# Patient Record
Sex: Female | Born: 1975
Health system: Southern US, Community
[De-identification: ages and names within clinical notes are randomized; demographics above are authoritative.]

## PROBLEM LIST (undated history)

## (undated) DIAGNOSIS — I120 Hypertensive chronic kidney disease with stage 5 chronic kidney disease or end stage renal disease: Secondary | ICD-10-CM

## (undated) DIAGNOSIS — I10 Essential (primary) hypertension: Secondary | ICD-10-CM

## (undated) DIAGNOSIS — N184 Chronic kidney disease, stage 4 (severe): Secondary | ICD-10-CM

## (undated) DIAGNOSIS — N186 End stage renal disease: Secondary | ICD-10-CM

## (undated) DIAGNOSIS — I2510 Atherosclerotic heart disease of native coronary artery without angina pectoris: Secondary | ICD-10-CM

## (undated) DIAGNOSIS — B009 Herpesviral infection, unspecified: Secondary | ICD-10-CM

## (undated) HISTORY — PX: HYSTEROPLASTY: SHX988

## (undated) HISTORY — PX: APPENDECTOMY: SHX54

## (undated) HISTORY — DX: Dependence on renal dialysis: N18.6

## (undated) HISTORY — PX: THYROID SURGERY: SHX805

## (undated) HISTORY — DX: Herpesviral infection, unspecified: B00.9

---

## 2018-05-30 DIAGNOSIS — B009 Herpesviral infection, unspecified: Secondary | ICD-10-CM | POA: Insufficient documentation

## 2019-04-21 DIAGNOSIS — I219 Acute myocardial infarction, unspecified: Secondary | ICD-10-CM

## 2019-04-21 HISTORY — DX: Acute myocardial infarction, unspecified: I21.9

## 2019-06-01 DIAGNOSIS — N179 Acute kidney failure, unspecified: Secondary | ICD-10-CM

## 2019-06-01 DIAGNOSIS — N189 Chronic kidney disease, unspecified: Secondary | ICD-10-CM | POA: Insufficient documentation

## 2019-06-01 HISTORY — DX: Chronic kidney disease, unspecified: N18.9

## 2019-06-01 HISTORY — DX: Chronic kidney disease, unspecified: N17.9

## 2019-07-12 DIAGNOSIS — N189 Chronic kidney disease, unspecified: Secondary | ICD-10-CM | POA: Insufficient documentation

## 2019-07-12 DIAGNOSIS — Z87448 Personal history of other diseases of urinary system: Secondary | ICD-10-CM | POA: Insufficient documentation

## 2019-07-12 HISTORY — DX: Chronic kidney disease, unspecified: N18.9

## 2020-01-24 DIAGNOSIS — R319 Hematuria, unspecified: Secondary | ICD-10-CM

## 2020-01-24 DIAGNOSIS — I151 Hypertension secondary to other renal disorders: Secondary | ICD-10-CM | POA: Insufficient documentation

## 2020-01-24 DIAGNOSIS — N83209 Unspecified ovarian cyst, unspecified side: Secondary | ICD-10-CM | POA: Insufficient documentation

## 2020-01-24 DIAGNOSIS — M722 Plantar fascial fibromatosis: Secondary | ICD-10-CM | POA: Insufficient documentation

## 2020-01-24 DIAGNOSIS — Z01818 Encounter for other preprocedural examination: Secondary | ICD-10-CM | POA: Insufficient documentation

## 2020-01-24 HISTORY — DX: Unspecified ovarian cyst, unspecified side: N83.209

## 2020-01-24 HISTORY — DX: Hematuria, unspecified: R31.9

## 2020-01-24 HISTORY — DX: Hypertension secondary to other renal disorders: I15.1

## 2020-03-27 ENCOUNTER — Emergency Department (HOSPITAL_COMMUNITY): Payer: POS

## 2020-03-27 ENCOUNTER — Other Ambulatory Visit: Payer: Self-pay

## 2020-03-27 ENCOUNTER — Inpatient Hospital Stay (HOSPITAL_COMMUNITY)
Admission: EM | Admit: 2020-03-27 | Discharge: 2020-04-03 | DRG: 246 | Disposition: A | Discharge status: Home / Self Care | Payer: POS | Attending: Internal Medicine | Admitting: Internal Medicine

## 2020-03-27 DIAGNOSIS — R778 Other specified abnormalities of plasma proteins: Secondary | ICD-10-CM

## 2020-03-27 DIAGNOSIS — I5041 Acute combined systolic (congestive) and diastolic (congestive) heart failure: Secondary | ICD-10-CM | POA: Diagnosis present

## 2020-03-27 DIAGNOSIS — I309 Acute pericarditis, unspecified: Secondary | ICD-10-CM

## 2020-03-27 DIAGNOSIS — D631 Anemia in chronic kidney disease: Secondary | ICD-10-CM | POA: Diagnosis present

## 2020-03-27 DIAGNOSIS — N644 Mastodynia: Secondary | ICD-10-CM | POA: Diagnosis present

## 2020-03-27 DIAGNOSIS — N2581 Secondary hyperparathyroidism of renal origin: Secondary | ICD-10-CM | POA: Diagnosis present

## 2020-03-27 DIAGNOSIS — R630 Anorexia: Secondary | ICD-10-CM | POA: Diagnosis present

## 2020-03-27 DIAGNOSIS — Z87891 Personal history of nicotine dependence: Secondary | ICD-10-CM

## 2020-03-27 DIAGNOSIS — D72829 Elevated white blood cell count, unspecified: Secondary | ICD-10-CM | POA: Diagnosis present

## 2020-03-27 DIAGNOSIS — E872 Acidosis: Secondary | ICD-10-CM | POA: Diagnosis present

## 2020-03-27 DIAGNOSIS — N186 End stage renal disease: Secondary | ICD-10-CM | POA: Diagnosis not present

## 2020-03-27 DIAGNOSIS — N250 Renal osteodystrophy: Secondary | ICD-10-CM | POA: Diagnosis present

## 2020-03-27 DIAGNOSIS — Z8249 Family history of ischemic heart disease and other diseases of the circulatory system: Secondary | ICD-10-CM

## 2020-03-27 DIAGNOSIS — Z79899 Other long term (current) drug therapy: Secondary | ICD-10-CM

## 2020-03-27 DIAGNOSIS — I319 Disease of pericardium, unspecified: Secondary | ICD-10-CM

## 2020-03-27 DIAGNOSIS — I301 Infective pericarditis: Secondary | ICD-10-CM | POA: Diagnosis present

## 2020-03-27 DIAGNOSIS — I214 Non-ST elevation (NSTEMI) myocardial infarction: Principal | ICD-10-CM | POA: Diagnosis present

## 2020-03-27 DIAGNOSIS — Z6825 Body mass index (BMI) 25.0-25.9, adult: Secondary | ICD-10-CM

## 2020-03-27 DIAGNOSIS — E785 Hyperlipidemia, unspecified: Secondary | ICD-10-CM | POA: Diagnosis present

## 2020-03-27 DIAGNOSIS — Z23 Encounter for immunization: Secondary | ICD-10-CM

## 2020-03-27 DIAGNOSIS — Z992 Dependence on renal dialysis: Secondary | ICD-10-CM

## 2020-03-27 DIAGNOSIS — R079 Chest pain, unspecified: Secondary | ICD-10-CM

## 2020-03-27 DIAGNOSIS — M898X9 Other specified disorders of bone, unspecified site: Secondary | ICD-10-CM | POA: Diagnosis present

## 2020-03-27 DIAGNOSIS — Z833 Family history of diabetes mellitus: Secondary | ICD-10-CM

## 2020-03-27 DIAGNOSIS — N269 Renal sclerosis, unspecified: Secondary | ICD-10-CM | POA: Diagnosis present

## 2020-03-27 DIAGNOSIS — Z20822 Contact with and (suspected) exposure to covid-19: Secondary | ICD-10-CM | POA: Diagnosis present

## 2020-03-27 DIAGNOSIS — I2510 Atherosclerotic heart disease of native coronary artery without angina pectoris: Secondary | ICD-10-CM | POA: Diagnosis present

## 2020-03-27 DIAGNOSIS — I132 Hypertensive heart and chronic kidney disease with heart failure and with stage 5 chronic kidney disease, or end stage renal disease: Secondary | ICD-10-CM | POA: Diagnosis present

## 2020-03-27 DIAGNOSIS — Z95820 Peripheral vascular angioplasty status with implants and grafts: Secondary | ICD-10-CM

## 2020-03-27 HISTORY — DX: Chronic kidney disease, stage 4 (severe): N18.4

## 2020-03-27 HISTORY — DX: Essential (primary) hypertension: I10

## 2020-03-27 LAB — I-STAT BETA HCG BLOOD, ED (MC, WL, AP ONLY): I-stat hCG, quantitative: <5 m[IU]/mL (ref <5)

## 2020-03-27 LAB — CBC
HCT: 35.1 % — ABNORMAL LOW (ref 36.0–46.0)
Hemoglobin: 11.2 g/dL — ABNORMAL LOW (ref 12.0–15.0)
MCH: 26.9 pg (ref 26.0–34.0)
MCHC: 31.9 g/dL (ref 30.0–36.0)
MCV: 84.4 fL (ref 80.0–100.0)
Platelets: 403 10*3/uL — ABNORMAL HIGH (ref 150–400)
RBC: 4.16 MIL/uL (ref 3.87–5.11)
RDW: 15.5 % (ref 11.5–15.5)
WBC: 14.3 10*3/uL — ABNORMAL HIGH (ref 4.0–10.5)
nRBC: 0 % (ref 0.0–0.2)

## 2020-03-27 LAB — BASIC METABOLIC PANEL
Anion gap: 14 (ref 5–15)
BUN: 44 mg/dL — ABNORMAL HIGH (ref 6–20)
CO2: 17 mmol/L — ABNORMAL LOW (ref 22–32)
Calcium: 9.4 mg/dL (ref 8.9–10.3)
Chloride: 108 mmol/L (ref 98–111)
Creatinine, Ser: 11.16 mg/dL — ABNORMAL HIGH (ref 0.44–1.00)
GFR, Estimated: 4 mL/min — ABNORMAL LOW (ref >60)
Glucose, Bld: 106 mg/dL — ABNORMAL HIGH (ref 70–99)
Potassium: 4.8 mmol/L (ref 3.5–5.1)
Sodium: 139 mmol/L (ref 135–145)

## 2020-03-27 LAB — HIV ANTIBODY (ROUTINE TESTING W REFLEX): HIV Screen 4th Generation wRfx: NONREACTIVE

## 2020-03-27 LAB — TROPONIN I (HIGH SENSITIVITY)
Troponin I (High Sensitivity): 1315 ng/L (ref <18)
Troponin I (High Sensitivity): 1636 ng/L (ref <18)
Troponin I (High Sensitivity): 2903 ng/L (ref <18)

## 2020-03-27 LAB — BRAIN NATRIURETIC PEPTIDE: B Natriuretic Peptide: 160.7 pg/mL — ABNORMAL HIGH (ref 0.0–100.0)

## 2020-03-27 LAB — RESP PANEL BY RT-PCR (FLU A&B, COVID) ARPGX2
Influenza A by PCR: NEGATIVE
Influenza B by PCR: NEGATIVE
SARS Coronavirus 2 by RT PCR: NEGATIVE

## 2020-03-27 MED ORDER — CHOLECALCIFEROL 10 MCG (400 UNIT) PO TABS
400.0000 [IU] | ORAL_TABLET | Freq: Every day | ORAL | Status: DC
  Administered 2020-03-27 – 2020-04-03 (×8): 400 [IU] via ORAL
  Filled 2020-03-27 (×9): qty 1

## 2020-03-27 MED ORDER — ONDANSETRON HCL 4 MG/2ML IJ SOLN
4.0000 mg | Freq: Once | INTRAMUSCULAR | Status: AC
  Administered 2020-03-27: 4 mg via INTRAVENOUS
  Filled 2020-03-27: qty 2

## 2020-03-27 MED ORDER — ACETAMINOPHEN 325 MG PO TABS
650.0000 mg | ORAL_TABLET | Freq: Four times a day (QID) | ORAL | Status: DC | PRN
  Administered 2020-04-02 – 2020-04-03 (×2): 650 mg via ORAL
  Filled 2020-03-27 (×2): qty 2

## 2020-03-27 MED ORDER — ONDANSETRON HCL 4 MG PO TABS: 4.0000 mg | ORAL_TABLET | Freq: Four times a day (QID) | ORAL | Status: DC | PRN

## 2020-03-27 MED ORDER — CALCITRIOL 0.25 MCG PO CAPS
0.2500 ug | ORAL_CAPSULE | ORAL | Status: DC
  Administered 2020-03-27 – 2020-04-03 (×4): 0.25 ug via ORAL
  Filled 2020-03-27 (×6): qty 1

## 2020-03-27 MED ORDER — LABETALOL HCL 200 MG PO TABS
200.0000 mg | ORAL_TABLET | Freq: Two times a day (BID) | ORAL | Status: DC
  Administered 2020-03-27 – 2020-03-28 (×2): 200 mg via ORAL
  Filled 2020-03-27 (×3): qty 1

## 2020-03-27 MED ORDER — SODIUM BICARBONATE 650 MG PO TABS
650.0000 mg | ORAL_TABLET | Freq: Two times a day (BID) | ORAL | Status: DC
  Administered 2020-03-27 – 2020-04-03 (×14): 650 mg via ORAL
  Filled 2020-03-27 (×14): qty 1

## 2020-03-27 MED ORDER — ACETAMINOPHEN 650 MG RE SUPP: 650.0000 mg | Freq: Four times a day (QID) | RECTAL | Status: DC | PRN

## 2020-03-27 MED ORDER — ONDANSETRON HCL 4 MG/2ML IJ SOLN
4.0000 mg | Freq: Four times a day (QID) | INTRAMUSCULAR | Status: DC | PRN
  Administered 2020-03-29: 4 mg via INTRAVENOUS
  Filled 2020-03-27 (×2): qty 2

## 2020-03-27 MED ORDER — MORPHINE SULFATE (PF) 2 MG/ML IV SOLN
2.0000 mg | Freq: Once | INTRAVENOUS | Status: AC
  Administered 2020-03-27: 2 mg via INTRAVENOUS
  Filled 2020-03-27: qty 1

## 2020-03-27 MED ORDER — ASPIRIN 325 MG PO TABS
325.0000 mg | ORAL_TABLET | Freq: Every day | ORAL | Status: DC
  Administered 2020-03-27: 325 mg via ORAL
  Filled 2020-03-27: qty 1

## 2020-03-27 MED ORDER — CHLORHEXIDINE GLUCONATE CLOTH 2 % EX PADS
6.0000 | MEDICATED_PAD | Freq: Every day | CUTANEOUS | Status: DC
  Administered 2020-03-29 – 2020-04-02 (×2): 6 via TOPICAL

## 2020-03-27 NOTE — H&P (Signed)
History and PhysicalZaylah Greene   VEH:[id number] DOB: [birth year] DOA: 03/27/2020  Referring MD/provider: Sharyn Lull PA PCP: Patient, No Pcp Per   Patient coming from: Home  Chief Complaint: Chest pain  History of Present Illness:   Stacie Greene is an 44 y.o. female with HTN and ESRD undergoing pretransplant work-up at [facility] about to start peritoneal dialysis was in her usual state of health until yesterday when she developed sharp chest pain with no provocation.  Notes the chest pain is worsened with lying down and improved with sitting up.  She notes it is worse with taking deep breaths and with coughing.  Not necessarily worsened with exertion.  No associated shortness of breath or DOE.  Patient denies fevers or chills.  No orthopnea or PND.  She does have nausea and vomited x2 today.  She has anorexia only since yesterday.  No blood in the vomitus.  No abdominal pain.  No diarrhea.  No palpitations.  ED Course:  The patient was noted to have normal vital signs.  Laboratory data were notable for creatinine of 11, WBC 14 and troponin of 1600.  Patient was discussed with cardiology and nephrology both of whom requested admission by hospitalist for further work-up.  ROS:   ROS   Review of Systems: Per HPI  Past Medical History:   No past medical history on file.  Past Surgical History:     Social History:   Social History   Socioeconomic History  . Marital status: Single    Spouse name: Not on file  . Number of children: Not on file  . Years of education: Not on file  . Highest education level: Not on file  Occupational History  . Not on file  Tobacco Use  . Smoking status: Not on file  Substance and Sexual Activity  . Alcohol use: Not on file  . Drug use: Not on file  . Sexual activity: Not on file  Other Topics Concern  . Not on file  Social History Narrative  . Not on file   Social Determinants of Health   Financial  Resource Strain:   . Difficulty of Paying Living Expenses: Not on file  Food Insecurity:   . Worried About Charity fundraiser in the Last Year: Not on file  . Ran Out of Food in the Last Year: Not on file  Transportation Needs:   . Lack of Transportation (Medical): Not on file  . Lack of Transportation (Non-Medical): Not on file  Physical Activity:   . Days of Exercise per Week: Not on file  . Minutes of Exercise per Session: Not on file  Stress:   . Feeling of Stress : Not on file  Social Connections:   . Frequency of Communication with Friends and Family: Not on file  . Frequency of Social Gatherings with Friends and Family: Not on file  . Attends Religious Services: Not on file  . Active Member of Clubs or Organizations: Not on file  . Attends Archivist Meetings: Not on file  . Marital Status: Not on file  Intimate Partner Violence:   . Fear of Current or Ex-Partner: Not on file  . Emotionally Abused: Not on file  . Physically Abused: Not on file  . Sexually Abused: Not on file    Allergies   Patient has no known allergies.  Family history:   No family history on file.  Current Medications:  Prior to Admission medications   Medication Sig Start Date End Date Taking? Authorizing Provider  calcitRIOL (ROCALTROL) 0.25 MCG capsule Take 0.25 mcg by mouth every Monday, Wednesday, and Friday. 02/26/20  Yes [provider]  FEROSUL 325 (65 Fe) MG tablet Take 325 mg by mouth daily. 01/15/20  Yes [provider]  labetalol (NORMODYNE) 200 MG tablet Take 200 mg by mouth 2 (two) times daily. 03/15/20  Yes [provider]  sodium bicarbonate 650 MG tablet Take 650 mg by mouth 2 (two) times daily. 11/24/19  Yes [provider]  VITAMIN D PO Take 1 tablet by mouth daily.   Yes [provider]    Physical Exam:   Vitals:   03/27/20 1420 03/27/20 1700  BP: 139/88 (!) 146/90  Pulse: 87 78  Resp: 18 16  Temp: 98.2 F (36.8 C)    TempSrc: Oral   SpO2: 100% 100%     Physical Exam: Blood pressure (!) 146/90, pulse 78, temperature 98.2 F (36.8 C), temperature source Oral, resp. rate 16, SpO2 100 %. Gen: Somewhat anxious appearing female sitting up in stretcher in no acute distress. Eyes: sclera anicteric, conjuctiva mildly injected bilaterally she has a disconjugate gaze with mild proptosis of left eye. CVS: S1-S2, regular, no gallops she may have a rub heard at the left sternal border. Respiratory: Clear to auscultation and percussion.   GI: NABS, soft, NT, no rebound no guarding LE: No edema. No cyanosis Neuro: A/O x 3, Moving all extremities equally with normal strength, CN 3-12 intact, grossly nonfocal.  Psych: patient is logical and coherent, judgement and insight appear normal, mood and affect appropriate to situation. Skin: no rashes or lesions or ulcers,    Data Review:    Labs: Basic Metabolic Panel: Recent Labs  Lab 03/27/20 1419  NA 139  K 4.8  CL 108  CO2 17*  GLUCOSE 106*  BUN 44*  CREATININE 11.16*  CALCIUM 9.4   Liver Function Tests: No results for input(s): AST, ALT, ALKPHOS, BILITOT, PROT, ALBUMIN in the last 168 hours. No results for input(s): LIPASE, AMYLASE in the last 168 hours. No results for input(s): AMMONIA in the last 168 hours. CBC: Recent Labs  Lab 03/27/20 1419  WBC 14.3*  HGB 11.2*  HCT 35.1*  MCV 84.4  PLT 403*   Cardiac Enzymes: No results for input(s): CKTOTAL, CKMB, CKMBINDEX, TROPONINI in the last 168 hours.  BNP (last 3 results) No results for input(s): PROBNP in the last 8760 hours. CBG: No results for input(s): GLUCAP in the last 168 hours.  Urinalysis No results found for: COLORURINE, APPEARANCEUR, LABSPEC, PHURINE, GLUCOSEU, HGBUR, BILIRUBINUR, KETONESUR, PROTEINUR, UROBILINOGEN, NITRITE, LEUKOCYTESUR    Radiographic Studies: DG Chest 2 View  Result Date: 03/27/2020 CLINICAL DATA:  Intermittent chest pain.  History of hypertension.  EXAM: CHEST - 2 VIEW COMPARISON:  None. FINDINGS: Heart and mediastinal shadows are normal. The lungs are clear. The vascularity is normal. No effusions. Surgical clips in the low right neck probably related to thyroid surgery. Mild thoracolumbar scoliotic curvature. IMPRESSION: No active cardiopulmonary disease. Mild thoracolumbar scoliotic curvature. Electronically Signed   By: Nelson Chimes M.D.   On: 03/27/2020 15:26    EKG: Independently reviewed.  Sinus rhythm at 80.  Normal intervals.  Left axis at -23.  Patient does have PR depression in 1 and 2.  She has T wave inversions in L, V1- V3.   Assessment/Plan:   Principal Problem:   ESRD (end stage renal disease) ([place])  Active Problems:   Pericarditis   Elevated troponin  44 year old female with ESRD presents with pericarditis.  Pericarditis Chest pain classic with worsening upon recumbency, improved with sitting up with some PR depressions on EKG. Likely uremic although patient does have leukocytosis as well Will avoid both colchicine and NSAIDs given end-stage renal disease Could consider initiation of steroids if warranted Await cardiology/renal recommendations of uremic pericarditis Echocardiogram ordered Repeat EKG in a.m.  Elevated troponin Troponin is more elevated than I would have expected for pericarditis Troponin continues to trend up, will follow every 6 hours Patient does not have any exertional chest pain suggestive of ischemia Await cardiology input  ESRD Patient is just developed nausea, vomiting, pericarditis and anorexia Sounds like she is developing some uremia Electrolytes are okay Patient will likely need to be started on peritoneal dialysis per nephrology here or her outpatient nephrologist.  HTN Continue labetalol 200 twice daily   Other information:   DVT prophylaxis: SCD ordered. Code Status: Full Family Communication: None, patient is in communication with her family Disposition Plan:  Home Consults called: Nephrology, cardiology by ED Admission status: Observation  Micajah Dennin Tublu Ilia Engelbert [facility]  If 7PM-7AM, please contact night-coverage [email or web address] Password [facility] 03/27/2020, 6:35 PM

## 2020-03-27 NOTE — ED Notes (Signed)
Pt has complaints of nausea, PA made aware

## 2020-03-27 NOTE — ED Notes (Signed)
Attempted to call report, no answer

## 2020-03-27 NOTE — ED Provider Notes (Signed)
[facility] EMERGENCY DEPARTMENT Provider Note   CSN: [id number] Arrival date & time: 03/27/20  1345     History No chief complaint on file.   Stacie Greene is a 44 y.o. female.  HPI 44 year old female with no medical history or to review in our system, however she reports end-stage renal disease pending initiation of dialysis, presents to the ER with complaints of 3 days of central chest pain and "squeezing sensation" of her heart.  Pain is worse with lying down, relieved with sitting up.  Feels as though someone is squeezing her heart.  States that she has never had anxiety but "this may be how anxiety feels".  No prior cardiac history.  States she has been pending getting a possible dialysis catheter, per chart review, patient is also being evaluated for kidney transplant.  He endorses 3 nonbloody nonbilious episodes of emesis in the last 24 hours.  She states that she normally has some nausea but does not actually have any emesis.  Denies any cough, fevers, chills.  Not on any OCPs, no recent travel.  Denies any pain on inspiration, though she does have some associated shortness of breath with the onset of chest pain.  She has not taken anything for her symptoms.  She has not noticed any lower extremity swelling.Not on any blood thinners     No past medical history on file.  Patient Active Problem List   Diagnosis Date Noted  . ESRD (end stage renal disease) ([place]) 03/27/2020  . Pericarditis 03/27/2020  . Elevated troponin 03/27/2020    The histories are not reviewed yet. Please review them in the "History" navigator section and refresh this [place].   OB History   No obstetric history on file.     No family history on file.  Social History   Tobacco Use  . Smoking status: Not on file  Substance Use Topics  . Alcohol use: Not on file  . Drug use: Not on file    Home Medications Prior to Admission medications   Medication Sig Start Date End Date  Taking? Authorizing Provider  calcitRIOL (ROCALTROL) 0.25 MCG capsule Take 0.25 mcg by mouth every Monday, Wednesday, and Friday. 02/26/20  Yes [provider]  FEROSUL 325 (65 Fe) MG tablet Take 325 mg by mouth daily. 01/15/20  Yes [provider]  labetalol (NORMODYNE) 200 MG tablet Take 200 mg by mouth 2 (two) times daily. 03/15/20  Yes [provider]  sodium bicarbonate 650 MG tablet Take 650 mg by mouth 2 (two) times daily. 11/24/19  Yes [provider]  VITAMIN D PO Take 1 tablet by mouth daily.   Yes [provider]    Allergies    Patient has no known allergies.  Review of Systems   Review of Systems  Constitutional: Negative for chills and fever.  HENT: Negative for ear pain and sore throat.   Eyes: Negative for pain and visual disturbance.  Respiratory: Positive for shortness of breath. Negative for cough.   Cardiovascular: Positive for chest pain. Negative for palpitations.  Gastrointestinal: Negative for abdominal pain and vomiting.  Genitourinary: Negative for dysuria and hematuria.  Musculoskeletal: Negative for arthralgias and back pain.  Skin: Negative for color change and rash.  Neurological: Negative for seizures and syncope.  All other systems reviewed and are negative.   Physical Exam Updated Vital Signs BP 131/85 (BP Location: Left Arm)   Pulse 77   Temp 98.2 F (36.8 C) (Oral)  Resp 18   SpO2 100%   Physical Exam Vitals and nursing note reviewed.  Constitutional:      General: She is not in acute distress.    Appearance: She is well-developed.  HENT:     Head: Normocephalic and atraumatic.  Eyes:     Conjunctiva/sclera: Conjunctivae normal.  Cardiovascular:     Rate and Rhythm: Normal rate and regular rhythm.     Heart sounds: No murmur heard.   Pulmonary:     Effort: Pulmonary effort is normal. No respiratory distress.     Breath sounds: Normal breath sounds.  Abdominal:     General: Abdomen is flat.      Palpations: Abdomen is soft.     Tenderness: There is no abdominal tenderness.  Musculoskeletal:        General: Normal range of motion.     Cervical back: Neck supple.  Skin:    General: Skin is warm and dry.  Neurological:     Mental Status: She is alert.     ED Results / Procedures / Treatments   Labs (all labs ordered are listed, but only abnormal results are displayed) Labs Reviewed  BASIC METABOLIC PANEL - Abnormal; Notable for the following components:      Result Value   CO2 17 (*)    Glucose, Bld 106 (*)    BUN 44 (*)    Creatinine, Ser 11.16 (*)    GFR, Estimated 4 (*)    All other components within normal limits  CBC - Abnormal; Notable for the following components:   WBC 14.3 (*)    Hemoglobin 11.2 (*)    HCT 35.1 (*)    Platelets 403 (*)    All other components within normal limits  TROPONIN I (HIGH SENSITIVITY) - Abnormal; Notable for the following components:   Troponin I (High Sensitivity) 1,315 (*)    All other components within normal limits  TROPONIN I (HIGH SENSITIVITY) - Abnormal; Notable for the following components:   Troponin I (High Sensitivity) 1,636 (*)    All other components within normal limits  RESP PANEL BY RT-PCR (FLU A&B, COVID) ARPGX2  URINALYSIS, ROUTINE W REFLEX MICROSCOPIC  BRAIN NATRIURETIC PEPTIDE  HIV ANTIBODY (ROUTINE TESTING W REFLEX)  BASIC METABOLIC PANEL  CBC  I-STAT BETA HCG BLOOD, ED ([facility], [facility], [facility] ONLY)    EKG EKG Interpretation  Date/Time:  Wednesday March 27 2020 14:11:41 EST Ventricular Rate:  80 PR Interval:  160 QRS Duration: 66 QT Interval:  376 QTC Calculation: 433 R Axis:   -23 Text Interpretation: Normal sinus rhythm Minimal voltage criteria for LVH, may be normal variant ( R in aVL ) Anterior infarct , age undetermined Abnormal ECG No prior ECG for comparison. No STEMI Confirmed by Antony Blackbird [phone]) on 03/27/2020 3:03:47 PM   Radiology DG Chest 2 View  Result Date: 03/27/2020 CLINICAL  DATA:  Intermittent chest pain.  History of hypertension. EXAM: CHEST - 2 VIEW COMPARISON:  None. FINDINGS: Heart and mediastinal shadows are normal. The lungs are clear. The vascularity is normal. No effusions. Surgical clips in the low right neck probably related to thyroid surgery. Mild thoracolumbar scoliotic curvature. IMPRESSION: No active cardiopulmonary disease. Mild thoracolumbar scoliotic curvature. Electronically Signed   By: Nelson Chimes M.D.   On: 03/27/2020 15:26    Procedures Procedures (including critical care time)  Medications Ordered in ED Medications  labetalol (NORMODYNE) tablet 200 mg (has no administration in time range)  calcitRIOL (ROCALTROL) capsule 0.25 mcg (  has no administration in time range)  sodium bicarbonate tablet 650 mg (has no administration in time range)  cholecalciferol (VITAMIN D3) tablet 400 Units (has no administration in time range)  acetaminophen (TYLENOL) tablet 650 mg (has no administration in time range)    Or  acetaminophen (TYLENOL) suppository 650 mg (has no administration in time range)  ondansetron (ZOFRAN) tablet 4 mg (has no administration in time range)    Or  ondansetron (ZOFRAN) injection 4 mg (has no administration in time range)  ondansetron (ZOFRAN) injection 4 mg (4 mg Intravenous Given 03/27/20 1637)  morphine 2 MG/ML injection 2 mg (2 mg Intravenous Given 03/27/20 1637)    ED Course  I have reviewed the triage vital signs and the nursing notes.  Pertinent labs & imaging results that were available during my care of the patient were reviewed by me and considered in my medical decision making (see chart for details).    MDM Rules/Calculators/A&P                         44 year old female with complaints of chest pain. On arrival, vitals are overall reassuring, she is not tachycardic, tachypneic, hypertensive or hypoxic.  Physical exam unremarkable.  No lower extremity edema noted.   Labs reviewed and interpreted by me - BMP  with a creatinine of 11.16, BUN of 44, CO2 of 17, consistent with ESRD.  - CBC with a leukocytosis of 14.3, hemoglobin of 11.2 platelets of 403.  - Most notably, initial troponin of 1315, second at 1636  -COVID negative   EKG reviewed by myself my supervising physician Dr. Sherry Ruffing - No evidence of NSTEMI  MDM:  4 PM: Concern for NSTEMI, consulted cardiology who will see and evaluate the patient.  Patient given morphine, Zofran for nausea ( reviewed EKG w/no prolonged QTC), aspirin.  Pending cardiology evaluation   4:48PM: Spoke with Trish from cardiology, cardiology suspects her troponin is so elevated 2/2 to creatinine. Will consult to nephrology and hospitalist   Spoke w/ Nephrology Dr. Joylene Grapes who will see and evaluate the patient.   Spoke w/ hospitalist Dr. Jamse Arn who will admit the patient for further evaluation and treatment   Final Clinical Impression(s) / ED Diagnoses Final diagnoses:  Chest pain, unspecified type  Elevated troponin  ESRD (end stage renal disease) [facility])    Rx / DC Orders ED Discharge Orders    None       Lyndel Safe 03/27/20 1916    Tegeler, Gwenyth Allegra, MD 03/27/20 2333

## 2020-03-27 NOTE — Consult Note (Signed)
Nephrology Consult   Requesting provider: Hospitalist Service requesting consult: Bonnell Public Reason for consult: CKDV, uremia   Assessment/Recommendations: Stacie Greene is a/an 44 y.o. 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 BUN not markedly elevated but given her symptoms and no other explanation at this time we must presume uremia is the cause -Start dialysis tonight; 2 hr session and likely repeat tomorrow -Consider further treatment based on response and Cardiology eval  CKDV advancing to ESRD: 2/2 FSGS. Follows w/ Dr. Wynetta Emery seen at [facility] for transplant eval and surgical eval. Plans for PD in the future but starting on hemo for now given concerns as above -Plan for 2 hr session of dialysis tonight and reeval tomorrow -RUE AVF ready to use; 17G needles low flows today -Monitor Daily I/Os, Daily weight  -Start CLIP process tomorrow  Elevated Trop: unclear cause; could be demand, myocardial ischemia, or myopericarditis although this is uncommon from uremia. Cardiology to eval.  Hypertension: history of such. Can cntinue home labetalol for now Secondary hyperparathyroidism: Continue calcitriol  anemia of CKD: Continue iron  metabolic acidosis: Can continue sodium bicarb.  Will improve with dialysis  Recommendations conveyed to primary service.    [facility] 03/27/2020 9:05 PM   _____________________________________________________________________________________ CC: Chest pain  History of Present Illness: Stacie Greene is a/an 44 y.o. 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  Prior to this she was not having any abnormal symptoms.  After her chest pain started she developed nausea as well as vomiting.  She had no episodes of vomiting prior to chest pain.  She has never had chest pain like this before.  She has sharp chest pain that is located in the center of her chest.  Her pain is worse when she lies down flat and will get better when she sits up.  When she takes a deep breath she will noticed the chest pain.  She has not had fevers or chills.  She denies recent illness.  She has not had any changes to her urinary output.  Denies any fevers or chills.  She has advanced CKD and follows with Dr. Wynetta Emery in [facility].  She has a fistula in her right upper extremity that has been deemed ready to use by surgery.  She has seen surgery with plans to start peritoneal dialysis in the outpatient setting.  In the emergency department she was found to have normal potassium and sodium with a creatinine of 11 and a BUN of 44.  Bicarb 17.  Troponin was 1315.  No significant EKG changes per the emergency department.  Chest x-ray without significant pulmonary edema.  Negative for Covid.   Medications:  Current Facility-Administered Medications  Medication Dose Route Frequency Provider Last Rate Last Admin  . acetaminophen (TYLENOL) tablet 650 mg  650 mg Oral Q6H PRN Vashti Hey, MD       Or  . acetaminophen (TYLENOL) suppository 650 mg  650 mg Rectal Q6H PRN Bonnell Public Tublu, MD      . calcitRIOL (ROCALTROL) capsule 0.25 mcg  0.25 mcg Oral Q M,W,F Jamse Arn Kyra Searles, MD      . [  START ON 03/28/2020] Chlorhexidine Gluconate Cloth 2 % PADS 6 each  6 each Topical Q0600 Reesa Chew, MD      . cholecalciferol (VITAMIN D3) tablet 400 Units  400 Units Oral Daily Bonnell Public Tublu, MD      . labetalol (NORMODYNE) tablet 200 mg  200 mg Oral BID Bonnell Public Tublu, MD      . ondansetron  [facility]) tablet 4 mg  4 mg Oral Q6H PRN Vashti Hey, MD       Or  . ondansetron [facility]) injection 4 mg  4 mg Intravenous Q6H PRN Bonnell Public Tublu, MD      . sodium bicarbonate tablet 650 mg  650 mg Oral BID Vashti Hey, MD       Current Outpatient Medications  Medication Sig Dispense Refill  . calcitRIOL (ROCALTROL) 0.25 MCG capsule Take 0.25 mcg by mouth every Monday, Wednesday, and Friday.    . FEROSUL 325 (65 Fe) MG tablet Take 325 mg by mouth daily.    Marland Kitchen labetalol (NORMODYNE) 200 MG tablet Take 200 mg by mouth 2 (two) times daily.    . sodium bicarbonate 650 MG tablet Take 650 mg by mouth 2 (two) times daily.    Marland Kitchen VITAMIN D PO Take 1 tablet by mouth daily.       ALLERGIES Patient has no known allergies.  MEDICAL HISTORY No past medical history on file.   SOCIAL HISTORY Social History   Socioeconomic History  . Marital status: Single    Spouse name: Not on file  . Number of children: Not on file  . Years of education: Not on file  . Highest education level: Not on file  Occupational History  . Not on file  Tobacco Use  . Smoking status: Not on file  Substance and Sexual Activity  . Alcohol use: Not on file  . Drug use: Not on file  . Sexual activity: Not on file  Other Topics Concern  . Not on file  Social History Narrative  . Not on file   Social Determinants of Health   Financial Resource Strain:   . Difficulty of Paying Living Expenses: Not on file  Food Insecurity:   . Worried About Charity fundraiser in the Last Year: Not on file  . Ran Out of Food in the Last Year: Not on file  Transportation Needs:   . Lack of Transportation (Medical): Not on file  . Lack of Transportation (Non-Medical): Not on file  Physical Activity:   . Days of Exercise per Week: Not on file  . Minutes of Exercise per Session: Not on file  Stress:   . Feeling of Stress : Not on file  Social Connections:   . Frequency of Communication with  Friends and Family: Not on file  . Frequency of Social Gatherings with Friends and Family: Not on file  . Attends Religious Services: Not on file  . Active Member of Clubs or Organizations: Not on file  . Attends Archivist Meetings: Not on file  . Marital Status: Not on file  Intimate Partner Violence:   . Fear of Current or Ex-Partner: Not on file  . Emotionally Abused: Not on file  . Physically Abused: Not on file  . Sexually Abused: Not on file     FAMILY HISTORY Her mother has diabetes and father has CHF   Review of Systems: 12 systems reviewed Otherwise as per HPI, all other systems reviewed and negative  Physical Exam: Vitals:   03/27/20 1700 03/27/20 1903  BP: (!) 146/90 131/85  Pulse: 78 77  Resp: 16 18  Temp:    SpO2: 100% 100%   No intake/output data recorded. No intake or output data in the 24 hours ending 03/27/20 2105 General: well-appearing, no acute distress HEENT: anicteric sclera, oropharynx clear without lesions CV: regular rate, normal rhythm, no murmurs, no gallops, no rubs, no peripheral edema Lungs: clear to auscultation bilaterally, normal work of breathing Abd: soft, non-tender, non-distended Skin: no visible lesions or rashes Psych: alert, engaged, appropriate mood and affect Musculoskeletal: no obvious deformities Neuro: normal speech, no gross focal deficits   Test Results Reviewed Lab Results  Component Value Date   NA 139 03/27/2020   K 4.8 03/27/2020   CL 108 03/27/2020   CO2 17 (L) 03/27/2020   BUN 44 (H) 03/27/2020   CREATININE 11.16 (H) 03/27/2020   CALCIUM 9.4 03/27/2020     I have reviewed all relevant outside healthcare records related to the patient's current hospitalization

## 2020-03-27 NOTE — ED Triage Notes (Signed)
Pt here from ems with c/o chest pain may be due to anxiety according to pt , pt is to start dialysis soon has not started yet

## 2020-03-28 ENCOUNTER — Encounter (HOSPITAL_COMMUNITY): Payer: Self-pay | Admitting: Internal Medicine

## 2020-03-28 ENCOUNTER — Observation Stay (HOSPITAL_COMMUNITY): Payer: POS

## 2020-03-28 DIAGNOSIS — Z20822 Contact with and (suspected) exposure to covid-19: Secondary | ICD-10-CM | POA: Diagnosis present

## 2020-03-28 DIAGNOSIS — I400 Infective myocarditis: Secondary | ICD-10-CM

## 2020-03-28 DIAGNOSIS — D631 Anemia in chronic kidney disease: Secondary | ICD-10-CM | POA: Diagnosis present

## 2020-03-28 DIAGNOSIS — I5041 Acute combined systolic (congestive) and diastolic (congestive) heart failure: Secondary | ICD-10-CM | POA: Diagnosis present

## 2020-03-28 DIAGNOSIS — R778 Other specified abnormalities of plasma proteins: Secondary | ICD-10-CM | POA: Diagnosis not present

## 2020-03-28 DIAGNOSIS — Z992 Dependence on renal dialysis: Secondary | ICD-10-CM | POA: Diagnosis not present

## 2020-03-28 DIAGNOSIS — Z8249 Family history of ischemic heart disease and other diseases of the circulatory system: Secondary | ICD-10-CM | POA: Diagnosis not present

## 2020-03-28 DIAGNOSIS — I214 Non-ST elevation (NSTEMI) myocardial infarction: Secondary | ICD-10-CM | POA: Diagnosis present

## 2020-03-28 DIAGNOSIS — I2510 Atherosclerotic heart disease of native coronary artery without angina pectoris: Secondary | ICD-10-CM | POA: Diagnosis present

## 2020-03-28 DIAGNOSIS — R630 Anorexia: Secondary | ICD-10-CM | POA: Diagnosis present

## 2020-03-28 DIAGNOSIS — Z87891 Personal history of nicotine dependence: Secondary | ICD-10-CM | POA: Diagnosis not present

## 2020-03-28 DIAGNOSIS — N269 Renal sclerosis, unspecified: Secondary | ICD-10-CM | POA: Diagnosis present

## 2020-03-28 DIAGNOSIS — D72829 Elevated white blood cell count, unspecified: Secondary | ICD-10-CM | POA: Diagnosis present

## 2020-03-28 DIAGNOSIS — N185 Chronic kidney disease, stage 5: Secondary | ICD-10-CM

## 2020-03-28 DIAGNOSIS — R079 Chest pain, unspecified: Secondary | ICD-10-CM

## 2020-03-28 DIAGNOSIS — N644 Mastodynia: Secondary | ICD-10-CM | POA: Diagnosis present

## 2020-03-28 DIAGNOSIS — N179 Acute kidney failure, unspecified: Secondary | ICD-10-CM | POA: Diagnosis not present

## 2020-03-28 DIAGNOSIS — I5043 Acute on chronic combined systolic (congestive) and diastolic (congestive) heart failure: Secondary | ICD-10-CM | POA: Diagnosis not present

## 2020-03-28 DIAGNOSIS — I10 Essential (primary) hypertension: Secondary | ICD-10-CM | POA: Diagnosis not present

## 2020-03-28 DIAGNOSIS — E785 Hyperlipidemia, unspecified: Secondary | ICD-10-CM | POA: Diagnosis present

## 2020-03-28 DIAGNOSIS — Z6825 Body mass index (BMI) 25.0-25.9, adult: Secondary | ICD-10-CM | POA: Diagnosis not present

## 2020-03-28 DIAGNOSIS — I301 Infective pericarditis: Secondary | ICD-10-CM | POA: Diagnosis present

## 2020-03-28 DIAGNOSIS — Z23 Encounter for immunization: Secondary | ICD-10-CM | POA: Diagnosis not present

## 2020-03-28 DIAGNOSIS — N186 End stage renal disease: Secondary | ICD-10-CM | POA: Diagnosis present

## 2020-03-28 DIAGNOSIS — N2581 Secondary hyperparathyroidism of renal origin: Secondary | ICD-10-CM | POA: Diagnosis present

## 2020-03-28 DIAGNOSIS — M898X9 Other specified disorders of bone, unspecified site: Secondary | ICD-10-CM | POA: Diagnosis present

## 2020-03-28 DIAGNOSIS — Z79899 Other long term (current) drug therapy: Secondary | ICD-10-CM | POA: Diagnosis not present

## 2020-03-28 DIAGNOSIS — Z833 Family history of diabetes mellitus: Secondary | ICD-10-CM | POA: Diagnosis not present

## 2020-03-28 DIAGNOSIS — E872 Acidosis: Secondary | ICD-10-CM | POA: Diagnosis present

## 2020-03-28 DIAGNOSIS — N250 Renal osteodystrophy: Secondary | ICD-10-CM | POA: Diagnosis present

## 2020-03-28 DIAGNOSIS — I132 Hypertensive heart and chronic kidney disease with heart failure and with stage 5 chronic kidney disease, or end stage renal disease: Secondary | ICD-10-CM | POA: Diagnosis present

## 2020-03-28 LAB — CBC
HCT: 31.4 % — ABNORMAL LOW (ref 36.0–46.0)
Hemoglobin: 9.8 g/dL — ABNORMAL LOW (ref 12.0–15.0)
MCH: 26.2 pg (ref 26.0–34.0)
MCHC: 31.2 g/dL (ref 30.0–36.0)
MCV: 84 fL (ref 80.0–100.0)
Platelets: 339 10*3/uL (ref 150–400)
RBC: 3.74 MIL/uL — ABNORMAL LOW (ref 3.87–5.11)
RDW: 15.4 % (ref 11.5–15.5)
WBC: 15.5 10*3/uL — ABNORMAL HIGH (ref 4.0–10.5)
nRBC: 0 % (ref 0.0–0.2)

## 2020-03-28 LAB — HEPATITIS B SURFACE ANTIGEN: Hepatitis B Surface Ag: NONREACTIVE

## 2020-03-28 LAB — BASIC METABOLIC PANEL
Anion gap: 12 (ref 5–15)
BUN: 54 mg/dL — ABNORMAL HIGH (ref 6–20)
CO2: 20 mmol/L — ABNORMAL LOW (ref 22–32)
Calcium: 8.7 mg/dL — ABNORMAL LOW (ref 8.9–10.3)
Chloride: 105 mmol/L (ref 98–111)
Creatinine, Ser: 10.78 mg/dL — ABNORMAL HIGH (ref 0.44–1.00)
GFR, Estimated: 4 mL/min — ABNORMAL LOW (ref >60)
Glucose, Bld: 114 mg/dL — ABNORMAL HIGH (ref 70–99)
Potassium: 4.5 mmol/L (ref 3.5–5.1)
Sodium: 137 mmol/L (ref 135–145)

## 2020-03-28 LAB — ECHOCARDIOGRAM COMPLETE
Area-P 1/2: 2.56 cm2
Height: 67 in
S' Lateral: 2.7 cm
Weight: 2613.77 oz

## 2020-03-28 LAB — LIPID PANEL
Cholesterol: 205 mg/dL — ABNORMAL HIGH (ref 0–200)
HDL: 26 mg/dL — ABNORMAL LOW (ref >40)
LDL Cholesterol: 144 mg/dL — ABNORMAL HIGH (ref 0–99)
Total CHOL/HDL Ratio: 7.9 RATIO
Triglycerides: 174 mg/dL — ABNORMAL HIGH (ref <150)
VLDL: 35 mg/dL (ref 0–40)

## 2020-03-28 LAB — HEMOGLOBIN A1C
Hgb A1c MFr Bld: 6.1 % — ABNORMAL HIGH (ref 4.8–5.6)
Mean Plasma Glucose: 128.37 mg/dL

## 2020-03-28 LAB — SEDIMENTATION RATE: Sed Rate: 35 mm/hr — ABNORMAL HIGH (ref 0–22)

## 2020-03-28 LAB — TROPONIN I (HIGH SENSITIVITY)
Troponin I (High Sensitivity): 4349 ng/L (ref <18)
Troponin I (High Sensitivity): 4677 ng/L (ref <18)
Troponin I (High Sensitivity): 4691 ng/L (ref <18)

## 2020-03-28 LAB — HEPATITIS B SURFACE ANTIBODY,QUALITATIVE: Hep B S Ab: NONREACTIVE

## 2020-03-28 LAB — C-REACTIVE PROTEIN: CRP: 0.7 mg/dL (ref <1.0)

## 2020-03-28 LAB — HEPATITIS B CORE ANTIBODY, IGM: Hep B C IgM: NONREACTIVE

## 2020-03-28 MED ORDER — SODIUM CHLORIDE 0.9 % IV SOLN: 100.0000 mL | INTRAVENOUS | Status: DC | PRN

## 2020-03-28 MED ORDER — ATORVASTATIN CALCIUM 40 MG PO TABS
40.0000 mg | ORAL_TABLET | Freq: Every day | ORAL | Status: DC
  Administered 2020-03-28 – 2020-04-02 (×6): 40 mg via ORAL
  Filled 2020-03-28 (×6): qty 1

## 2020-03-28 MED ORDER — PENTAFLUOROPROP-TETRAFLUOROETH EX AERO: 1.0000 "application " | INHALATION_SPRAY | CUTANEOUS | Status: DC | PRN

## 2020-03-28 MED ORDER — ASPIRIN EC 81 MG PO TBEC: 81.0000 mg | DELAYED_RELEASE_TABLET | Freq: Every day | ORAL | Status: DC

## 2020-03-28 MED ORDER — CARVEDILOL 3.125 MG PO TABS: 3.1250 mg | ORAL_TABLET | Freq: Two times a day (BID) | ORAL | Status: DC

## 2020-03-28 MED ORDER — CARVEDILOL 6.25 MG PO TABS
6.2500 mg | ORAL_TABLET | Freq: Two times a day (BID) | ORAL | Status: DC
  Administered 2020-03-28 – 2020-04-03 (×10): 6.25 mg via ORAL
  Filled 2020-03-28 (×11): qty 1

## 2020-03-28 MED ORDER — LIDOCAINE-PRILOCAINE 2.5-2.5 % EX CREA: 1.0000 "application " | TOPICAL_CREAM | CUTANEOUS | Status: DC | PRN

## 2020-03-28 MED ORDER — SODIUM CHLORIDE 0.9% FLUSH
3.0000 mL | Freq: Two times a day (BID) | INTRAVENOUS | Status: DC
  Administered 2020-03-28 – 2020-04-02 (×5): 3 mL via INTRAVENOUS

## 2020-03-28 MED ORDER — LIDOCAINE HCL (PF) 1 % IJ SOLN: 5.0000 mL | INTRAMUSCULAR | Status: DC | PRN

## 2020-03-28 NOTE — Progress Notes (Addendum)
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  Pain is worse with deep breathing and coughing.  Pain is not exertional.  Never had similar pain.  In ED, hemodynamically stable.  WBC 14.3.  Hgb 11.2.  Cr 11.16.  BUN 44.  Bicarb 17.  BNP 160.7.  Troponin 1300.  EKG without acute ischemic finding or normal intervals.  She was admitted with concern for pericarditis although etiology was not clear.  Nephrology and cardiology consulted.  TTE ordered.  The next day, troponin elevated to 4600.  However, she remained free of cardiopulmonary symptoms.   Subjective: Seen and examined earlier this morning.  No major events overnight of this morning.  Troponin elevated to 4600.  However, chest pain-free since yesterday afternoon.  She denies dyspnea, cough, orthopnea, PND palpitation, GI or UTI symptoms.  She reports recent sore throat about a week ago that she attributed to her allergies.  She has not had fever or chills.  Further died in his 46s from congestive heart failure "related to drug use".   Objective: Vitals:   03/28/20 0057 03/28/20 0220 03/28/20 0232 03/28/20 0717  BP: 120/78 121/81 124/78 117/74  Pulse: 85   92  Resp: $Remo'18 15 13 17  'GHRnk$ Temp: 98.5 F (36.9 C) 98.6 F (37 C)  98.4 F (36.9 C)  TempSrc:  Oral  Oral  SpO2: 100% 100%  99%  Weight:  74.1 kg    Height:        Intake/Output Summary (Last 24 hours) at 03/28/2020 1344 Last data filed at 03/28/2020 0855 Gross per 24 hour  Intake 120 ml  Output --  Net 120 ml   Filed Weights   03/28/20 0050 03/28/20 0220  Weight: 73 kg 74.1 kg     Examination:  GENERAL: No apparent distress.  Nontoxic. HEENT: MMM.  Vision and hearing grossly intact.  NECK: Supple.  No apparent JVD.  RESP: On RA.  No IWOB.  Fair aeration bilaterally. CVS:  RRR. Heart sounds normal.  No friction rubs. ABD/GI/GU: BS+. Abd soft, NTND.  MSK/EXT:  Moves extremities. No apparent deformity. No edema.  SKIN: no apparent skin lesion or wound NEURO: Awake, alert and oriented appropriately.  No apparent focal neuro deficit. PSYCH: Calm. Normal affect.   Procedures:  None  Microbiology summarized: [id number] and influenza PCR nonreactive.  Assessment & Plan: Atypical chest pain/elevated troponin: myopericarditis?  Troponin trended from 1300-4600.  Troponin trended with TTE finding concerning for non-STEMI.  However, patient remained chest pain-free since she arrived in the hospital.  No significant finding on EKG.  She has leukocytosis but no respiratory symptoms, fever or significant finding on CXR to suggest infectious process.  PE unlikely.  BUN is not high enough to cause uremia. 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  Does not appear fluid overloaded.  But TTE finding with markedly elevated troponin and chest pain concerning for non-STEMI.  Appears euvolemic. 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  Plan was for PD and renal transplant down the road.  Nephrology attempted to dialyze but aVF infiltrated.  No hyperkalemia.  Mild azotemia but not uremic. -Nephrology following.  Renal osteodystrophy/metabolic acidosis -Per nephrology.  Anemia of renal disease: Hgb relatively stable. -Continue  monitoring  Essential hypertension: Normotensive. -Continue home labetalol  Leukocytosis-unspecified.  No obvious source of infection except possible pericarditis.  Afebrile. -Continue trending  Body mass index is 25.59 kg/m.         DVT prophylaxis:  SCDs Start: 03/27/20 1851  Code Status: Full code Family Communication: Patient and/or RN. Available if any question.  Status is: Observation  The patient will require care spanning > 2 midnights and should be moved to inpatient because: Ongoing diagnostic testing needed not appropriate for outpatient work up, Unsafe d/c plan and Inpatient level of care appropriate due to severity of illness  Dispo: The patient is from: Home              Anticipated d/c is to: Home              Anticipated d/c date is: 2 days              Patient currently is not medically stable to d/c.       Consultants:  Nephrology Cardiology   Sch Meds:  Scheduled Meds: . calcitRIOL  0.25 mcg Oral Q M,W,F  . Chlorhexidine Gluconate Cloth  6 each Topical Q0600  . cholecalciferol  400 Units Oral Daily  . labetalol  200 mg Oral BID  . sodium bicarbonate  650 mg Oral BID   Continuous Infusions: . sodium chloride    . sodium chloride     PRN Meds:.sodium chloride, sodium chloride, acetaminophen **OR** acetaminophen, lidocaine (PF), lidocaine-prilocaine, ondansetron **OR** ondansetron (ZOFRAN) IV, pentafluoroprop-tetrafluoroeth  Antimicrobials: Anti-infectives (From admission, onward)   None       I have personally reviewed the following labs and images: CBC: Recent Labs  Lab 03/27/20 1419 03/28/20 0424  WBC 14.3* 15.5*  HGB 11.2* 9.8*  HCT 35.1* 31.4*  MCV 84.4 84.0  PLT 403* 339   BMP &GFR Recent Labs  Lab 03/27/20 1419 03/28/20 0424  NA 139 137  K 4.8 4.5  CL 108 105  CO2 17* 20*  GLUCOSE 106* 114*  BUN 44* 54*  CREATININE 11.16* 10.78*  CALCIUM 9.4 8.7*   Estimated Creatinine Clearance: 7.1 mL/min (A) (by C-G formula  based on SCr of 10.78 mg/dL (H)). Liver & Pancreas: No results for input(s): AST, ALT, ALKPHOS, BILITOT, PROT, ALBUMIN in the last 168 hours. No results for input(s): LIPASE, AMYLASE in the last 168 hours. No results for input(s): AMMONIA in the last 168 hours. Diabetic: No results for input(s): HGBA1C in the last 72 hours. No results for input(s): GLUCAP in the last 168 hours. Cardiac Enzymes: No results for input(s): CKTOTAL, CKMB, CKMBINDEX, TROPONINI in the last 168 hours. No results for input(s): PROBNP in the last 8760 hours. Coagulation Profile: No results for input(s): INR, PROTIME in the last 168 hours. Thyroid Function Tests: No results for input(s): TSH, T4TOTAL, FREET4, T3FREE, THYROIDAB in the last 72 hours. Lipid Profile: No results for input(s): CHOL, HDL, LDLCALC, TRIG, CHOLHDL, LDLDIRECT in the last 72 hours. Anemia Panel: No results for input(s): VITAMINB12, FOLATE, FERRITIN, TIBC, IRON, RETICCTPCT in the last 72 hours. Urine analysis:  No results found for: COLORURINE, APPEARANCEUR, LABSPEC, [place], GLUCOSEU, [place], BILIRUBINUR, KETONESUR, PROTEINUR, UROBILINOGEN, NITRITE, LEUKOCYTESUR Sepsis Labs: Invalid input(s): PROCALCITONIN, [place]  Microbiology: Recent Results (from the past 240 hour(s))  Resp Panel by RT-PCR (Flu A&B, Covid) Nasopharyngeal Swab     Status: None   Collection Time: 03/27/20  3:34 PM   Specimen: Nasopharyngeal Swab; Nasopharyngeal(NP) swabs in vial transport medium  Result Value Ref Range Status   SARS Coronavirus 2 by RT PCR NEGATIVE NEGATIVE Final    Comment: (NOTE) SARS-CoV-2 target nucleic acids are NOT DETECTED.  The SARS-CoV-2 RNA is generally detectable in upper respiratory specimens during the acute phase of infection. The lowest concentration of SARS-CoV-2 viral copies this assay can detect is 138 copies/mL. A negative result does not preclude SARS-Cov-2 infection and should not be used as the sole basis for treatment  or other patient management decisions. A negative result may occur with  improper specimen collection/handling, submission of specimen other than nasopharyngeal swab, presence of viral mutation(s) within the areas targeted by this assay, and inadequate number of viral copies(<138 copies/mL). A negative result must be combined with clinical observations, patient history, and epidemiological information. The expected result is Negative.  Fact Sheet for Patients:  EntrepreneurPulse.com.au  Fact Sheet for Healthcare Providers:  IncredibleEmployment.be  This test is no t yet approved or cleared by the [place] FDA and  has been authorized for detection and/or diagnosis of SARS-CoV-2 by FDA under an Emergency Use Authorization (EUA). This EUA will remain  in effect (meaning this test can be used) for the duration of the COVID-19 declaration under Section 564(b)(1) of the Act, 21 U.S.C.section 360bbb-3(b)(1), unless the authorization is terminated  or revoked sooner.       Influenza A by PCR NEGATIVE NEGATIVE Final   Influenza B by PCR NEGATIVE NEGATIVE Final    Comment: (NOTE) The Xpert Xpress SARS-CoV-2/FLU/RSV plus assay is intended as an aid in the diagnosis of influenza from Nasopharyngeal swab specimens and should not be used as a sole basis for treatment. Nasal washings and aspirates are unacceptable for Xpert Xpress SARS-CoV-2/FLU/RSV testing.  Fact Sheet for Patients: EntrepreneurPulse.com.au  Fact Sheet for Healthcare Providers: IncredibleEmployment.be  This test is not yet approved or cleared by the [place] FDA and has been authorized for detection and/or diagnosis of SARS-CoV-2 by FDA under an Emergency Use Authorization (EUA). This EUA will remain in effect (meaning this test can be used) for the duration of the COVID-19 declaration under Section 564(b)(1) of the Act, 21 U.S.C. section  360bbb-3(b)(1), unless the authorization is terminated or revoked.  Performed at [facility], [place] [address] [place], [place] [address]     Radiology Studies: DG Chest 2 View  Result Date: 03/27/2020 CLINICAL DATA:  Intermittent chest pain.  History of hypertension. EXAM: CHEST - 2 VIEW COMPARISON:  None. FINDINGS: Heart and mediastinal shadows are normal. The lungs are clear. The vascularity is normal. No effusions. Surgical clips in the low right neck probably related to thyroid surgery. Mild thoracolumbar scoliotic curvature. IMPRESSION: No active cardiopulmonary disease. Mild thoracolumbar scoliotic curvature. Electronically Signed   By: Nelson Chimes M.D.   On: 03/27/2020 15:26   ECHOCARDIOGRAM COMPLETE  Result Date: 03/28/2020    ECHOCARDIOGRAM REPORT   Patient Name:   Stacie Greene Date of Exam: 03/28/2020 Medical Rec #:  [id number]     Height:       67.0 in Accession #:    [id number]    Weight:  163.4 lb Date of Birth:  Jan 09, 1976     BSA:          1.856 m Patient Age:    44 years      BP:           117/74 mmHg Patient Gender: F             HR:           84 bpm. Exam Location:  Inpatient Procedure: 2D Echo Indications:    Chest Pain  History:        Patient has no prior history of Echocardiogram examinations.                 Risk Factors:Hypertension.  Sonographer:    Mikki Santee RDCS (AE) Referring Phys: [id number] [place]  1. LVEF is approximately 40% with akinesis of the distal inferior, distal anterior, mid/distal septal walls.. The left ventricle has moderately decreased function. description). There is mild left ventricular hypertrophy. Left ventricular diastolic parameters are consistent with Grade I diastolic dysfunction (impaired relaxation).  2. Right ventricular systolic function is normal. The right ventricular size is normal.  3. The mitral valve is normal in structure. Trivial mitral valve regurgitation.  4. The aortic valve is normal in  structure. Aortic valve regurgitation is not visualized.  5. The inferior vena cava is normal in size with greater than 50% respiratory variability, suggesting right atrial pressure of 3 mmHg. FINDINGS  Left Ventricle: LVEF is approximately 40% with akinesis of the distal inferior, distal anterior, mid/distal septal walls. The left ventricle has moderately decreased function. The left ventricle demonstrates regional wall motion abnormalities. The left ventricular internal cavity size was normal in size. There is mild left ventricular hypertrophy. Left ventricular diastolic parameters are consistent with Grade I diastolic dysfunction (impaired relaxation). Right Ventricle: The right ventricular size is normal. Right vetricular wall thickness was not assessed. Right ventricular systolic function is normal. Left Atrium: Left atrial size was normal in size. Right Atrium: Right atrial size was normal in size. Pericardium: There is no evidence of pericardial effusion. Mitral Valve: The mitral valve is normal in structure. Trivial mitral valve regurgitation. Tricuspid Valve: The tricuspid valve is normal in structure. Tricuspid valve regurgitation is trivial. Aortic Valve: The aortic valve is normal in structure. Aortic valve regurgitation is not visualized. Pulmonic Valve: The pulmonic valve was normal in structure. Pulmonic valve regurgitation is not visualized. Aorta: The aortic root is normal in size and structure. Venous: The inferior vena cava is normal in size with greater than 50% respiratory variability, suggesting right atrial pressure of 3 mmHg. IAS/Shunts: No atrial level shunt detected by color flow Doppler.  LEFT VENTRICLE PLAX 2D LVIDd:         4.20 cm  Diastology LVIDs:         2.70 cm  LV e' medial:    5.55 cm/s LV PW:         1.40 cm  LV E/e' medial:  10.4 LV IVS:        1.40 cm  LV e' lateral:   7.07 cm/s LVOT diam:     2.10 cm  LV E/e' lateral: 8.2 LV SV:         54 LV SV Index:   29 LVOT Area:     3.46  cm  RIGHT VENTRICLE RV S prime:     10.70 cm/s TAPSE (M-mode): 1.6 cm LEFT ATRIUM  Index       RIGHT ATRIUM           Index LA diam:        3.10 cm 1.67 cm/m  RA Area:     11.70 cm LA Vol (A2C):   47.7 ml 25.71 ml/m RA Volume:   26.00 ml  14.01 ml/m LA Vol (A4C):   31.2 ml 16.81 ml/m LA Biplane Vol: 40.7 ml 21.93 ml/m  AORTIC VALVE LVOT Vmax:   86.80 cm/s LVOT Vmean:  53.000 cm/s LVOT VTI:    0.157 m  AORTA Ao Root diam: 2.80 cm MITRAL VALVE MV Area (PHT): 2.56 cm    SHUNTS MV Decel Time: 296 msec    Systemic VTI:  0.16 m MV E velocity: 57.80 cm/s  Systemic Diam: 2.10 cm MV A velocity: 58.30 cm/s MV E/A ratio:  0.99 Dorris Carnes MD Electronically signed by Dorris Carnes MD Signature Date/Time: 03/28/2020/1:33:57 PM    Final       Hazeline Charnley T. [place]  If 7PM-7AM, please contact night-coverage [email or web address] 03/28/2020, 1:44 PM

## 2020-03-28 NOTE — Consult Note (Addendum)
Cardiology Consultation:   Patient ID: Robinette Esters MRN: [id number]; DOB: [birth year]  Admit date: 03/27/2020 Date of Consult: 03/28/2020  Primary Care Provider: Patient, No Pcp Per [place] Cardiologist: New to [place] Electrophysiologist:  None    Patient Profile:   Leaha Cuervo is a 44 y.o. 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 Cyndia Skeeters.  History of Present Illness:   Ms. Toothaker was in her usual state of health until 03/26/20 when she began experiencing sharp chest pain. She denied exertional component but pain was reported to be worse with deep breathing, coughing, and laying down, and improved with sitting up. She continued to have intermittent chest pain with associated nausea and vomiting 03/27/20. Given recurrent chest pain she presented to the ED for further evaluation.  She has no prior cardiac history. She is undergoing a work-up at [facility] for possible renal transplant. She is anticipating starting peritoneal dialysis in the near future. She was recommended to undergo an echocardiogram and stress test, however does not appear these have been completed yet. No prior ischemic evaluation in the past but CT A/P in Care Everywhere 01/2020 noted minor coronary artery calcifications concerning for early CAD.   At the time of this evaluation she reports improvement in her chest pain. No recurrence since her last episode while in the ED yesterday. She reported chest pain initially started 03/26/20 when she felt sudden onset non-radiating left sided chest. She reported associate diaphoresis, nausea and vomiting. The initial episode lasted 5-10 minutes before resolving spontaneously. She continued to have intermittent episodes over the next 24 hours which occurred at rest and were worse with laying down. She has no prior  history of chest pain. She does not get formal exercise but is not limited in her daily activities. She works at the [employer] center which requires her to occasionally lift heavy bags, but on the whole is not a strenuous job. She denies any trouble with exertional chest pain, SOB, DOE, palpitations, dizziness, lightheadedness, syncope, orthopnea, PND, or LE edmea. She reports having a sore throat, congestion, and mild cough 1 week ago which she attributed to allergies and has since resolved. She reports her father died from CHF in this 3s which she attributed to drug abuse and her paternal aunt had a heart transplant though she does not recall the circumstances around this. She is a former smoker and quit 1 year ago. She denies ETOH and illicit drug use.   Hospital course: VSS. Labs notable for electrolytes wnl, Cr 11.16, WBC 14.3>15.1, Hgb 11.2>9.8, PLT 339, BNP 160, HsTrop 1315>1636>2903>4349>4677. EKG with sinus rhythm, rate 80 bpm, PR depression in I and II, non-specific T wave abnormalities, no STE/D, no comparison. CXR showed no acute findings with mild scoliotic curvature of the thoracolumbar spine. 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 Nephrology following. Echo showed EF 40% with akinesis of the distal inferior/anterior/septal walls with moderate LVH, G1DD, and no significant valvular abnormalities. Cardiology asked to evaluate.      Past Medical History:  Diagnosis Date  . CKD (chronic kidney disease), stage IV ([place])   . Hypertension     Past Surgical History:  Procedure Laterality Date  . APPENDECTOMY    . THYROID SURGERY       Home Medications:  Prior to Admission  medications   Medication Sig Start Date End Date Taking? Authorizing Provider  calcitRIOL (ROCALTROL) 0.25 MCG capsule Take 0.25 mcg by mouth every Monday, Wednesday, and Friday. 02/26/20  Yes [provider]  FEROSUL 325 (65 Fe) MG tablet Take 325 mg by mouth daily.  01/15/20  Yes [provider]  labetalol (NORMODYNE) 200 MG tablet Take 200 mg by mouth 2 (two) times daily. 03/15/20  Yes [provider]  sodium bicarbonate 650 MG tablet Take 650 mg by mouth 2 (two) times daily. 11/24/19  Yes [provider]  VITAMIN D PO Take 1 tablet by mouth daily.   Yes [provider]    Inpatient Medications: Scheduled Meds: . calcitRIOL  0.25 mcg Oral Q M,W,F  . Chlorhexidine Gluconate Cloth  6 each Topical Q0600  . cholecalciferol  400 Units Oral Daily  . labetalol  200 mg Oral BID  . sodium bicarbonate  650 mg Oral BID   Continuous Infusions: . sodium chloride    . sodium chloride     PRN Meds: sodium chloride, sodium chloride, acetaminophen **OR** acetaminophen, lidocaine (PF), lidocaine-prilocaine, ondansetron **OR** ondansetron (ZOFRAN) IV, pentafluoroprop-tetrafluoroeth  Allergies:   No Known Allergies  Social History:   Social History   Socioeconomic History  . Marital status: Single    Spouse name: Not on file  . Number of children: Not on file  . Years of education: Not on file  . Highest education level: Not on file  Occupational History  . Not on file  Tobacco Use  . Smoking status: Former Research scientist (life sciences)  . Smokeless tobacco: Never Used  Vaping Use  . Vaping Use: Never used  Substance and Sexual Activity  . Alcohol use: Not Currently  . Drug use: Never  . Sexual activity: Not on file  Other Topics Concern  . Not on file  Social History Narrative  . Not on file   Social Determinants of Health   Financial Resource Strain: Not on file  Food Insecurity: Not on file  Transportation Needs: Not on file  Physical Activity: Not on file  Stress: Not on file  Social Connections: Not on file  Intimate Partner Violence: Not on file    Family History:   Father died from CHF in 62s; paternal aunt s/p heart transplant  ROS:  Please see the history of present illness.   All other ROS reviewed and negative.      Physical Exam/Data:   Vitals:   03/28/20 0057 03/28/20 0220 03/28/20 0232 03/28/20 0717  BP: 120/78 121/81 124/78 117/74  Pulse: 85   92  Resp: 18 15 13 17   Temp: 98.5 F (36.9 C) 98.6 F (37 C)  98.4 F (36.9 C)  TempSrc:  Oral  Oral  SpO2: 100% 100%  99%  Weight:  74.1 kg    Height:        Intake/Output Summary (Last 24 hours) at 03/28/2020 1313 Last data filed at 03/28/2020 0855 Gross per 24 hour  Intake 120 ml  Output --  Net 120 ml   Last 3 Weights 03/28/2020 03/28/2020  Weight (lbs) 163 lb 5.8 oz 160 lb 15 oz  Weight (kg) 74.1 kg 73 kg     Body mass index is 25.59 kg/m.  General:  Well nourished, well developed, in no acute distress HEENT: sclera anicteric Lymph: no adenopathy Neck: no JVD Endocrine:  No thryomegaly Vascular: No carotid bruits; distal pulses 2+ bilaterally   Cardiac:  normal S1, S2; RRR; no murmurs, rubs, or  gallops Lungs:  clear to auscultation bilaterally, no wheezing, rhonchi or rales  Abd: soft, nontender, no hepatomegaly  Ext: no edema Musculoskeletal:  No deformities, BUE and BLE strength normal and equal Skin: warm and dry  Neuro:  CNs 2-12 int/act, no focal abnormalities noted Psych:  Normal affect   EKG:  The EKG was personally reviewed and demonstrates:   sinus rhythm, rate 80 bpm, PR depression in I and II, non-specific T wave abnormalities, no STE/D, no comparison. Telemetry:  Telemetry was personally reviewed and demonstrates:  NSR  Relevant CV Studies: Echocardiogram 03/28/20: 1. LVEF is approximately 40% with akinesis of the distal inferior, distal  anterior, mid/distal septal walls.. The left ventricle has moderately  decreased function. description). There is mild left ventricular  hypertrophy. Left ventricular diastolic  parameters are consistent with Grade I diastolic dysfunction (impaired  relaxation).  2. Right ventricular systolic function is normal. The right ventricular  size is normal.  3. The mitral valve is  normal in structure. Trivial mitral valve  regurgitation.  4. The aortic valve is normal in structure. Aortic valve regurgitation is  not visualized.  5. The inferior vena cava is normal in size with greater than 50%  respiratory variability, suggesting right atrial pressure of 3 mmHg.   Laboratory Data:  High Sensitivity Troponin:   Recent Labs  Lab 03/27/20 1419 03/27/20 1628 03/27/20 2137 03/28/20 0424 03/28/20 0836  TROPONINIHS 1,315* 1,636* 2,903* 4,349* 4,677*     Chemistry Recent Labs  Lab 03/27/20 1419 03/28/20 0424  NA 139 137  K 4.8 4.5  CL 108 105  CO2 17* 20*  GLUCOSE 106* 114*  BUN 44* 54*  CREATININE 11.16* 10.78*  CALCIUM 9.4 8.7*  GFRNONAA 4* 4*  ANIONGAP 14 12    No results for input(s): PROT, ALBUMIN, AST, ALT, ALKPHOS, BILITOT in the last 168 hours. Hematology Recent Labs  Lab 03/27/20 1419 03/28/20 0424  WBC 14.3* 15.5*  RBC 4.16 3.74*  HGB 11.2* 9.8*  HCT 35.1* 31.4*  MCV 84.4 84.0  MCH 26.9 26.2  MCHC 31.9 31.2  RDW 15.5 15.4  PLT 403* 339   BNP Recent Labs  Lab 03/27/20 1419  BNP 160.7*    DDimer No results for input(s): DDIMER in the last 168 hours.   Radiology/Studies:  DG Chest 2 View  Result Date: 03/27/2020 CLINICAL DATA:  Intermittent chest pain.  History of hypertension. EXAM: CHEST - 2 VIEW COMPARISON:  None. FINDINGS: Heart and mediastinal shadows are normal. The lungs are clear. The vascularity is normal. No effusions. Surgical clips in the low right neck probably related to thyroid surgery. Mild thoracolumbar scoliotic curvature. IMPRESSION: No active cardiopulmonary disease. Mild thoracolumbar scoliotic curvature. Electronically Signed   By: Nelson Chimes M.D.   On: 03/27/2020 15:26     Assessment and Plan:   1. NSTEMI: patient presented with chest pain, worse with laying down and deep inspiration, improved with sitting upright. No exertional component or SOB. EKG with PR depressions and non-specific T wave  abnormalities. HsTrop 1315 on admission, up to 4677 on last check. She has no known CAD but noted to have minor coronary artery calcifications concerning for early CAD 01/2020. Echo showed EF 40% with akinesis of the distal inferior/anterior/septal walls. There was some concern for uremic pericarditis but nephrology thinks this is less likely. - Will plan for LHC to further evaluate chest pain, elevated troponins, and abnormal echo. The patient understands that risks included but are not limited to stroke (1 in  1000), death (1 in 59), kidney failure [usually temporary] (1 in 500), bleeding (1 in 200), allergic reaction [possibly serious] (1 in 200).  The patient understands and agrees to proceed.  - Will check FLP and A1C for risk stratification - Will start aspirin and statin - Will transition from labetalol to carvedilol   2. Acute combined CHF: Echo this admission with EF 40% with RWMA. Prior to 03/26/20 she had no cardiac complaints. She does report an episode of nasal congestion, and cough 1 week ago. Suspicions high for viral myopericarditis, though will elevated tropnins and echo findings, will need to rule out obstructive CAD.  - Will pursue LHC as above to r/o ischemia.  - Viral antibody tests ordered - Continue BBlocker  3. HTN: BP generally well controlled on home labetalol - Managed in the context of #1-2  4. HLD: LDL 144 on labs this admission.  - Will start atorvastatin 40mg  daily  5. ESRD 2/2 FSGS: in process of starting dialysis and undergoing evaluation for renal transplant. Nephrology following.  - Will discuss plans to start dialysis this admission in hopes of completing LHC tomorrow.     For questions or updates, please contact [place] Please consult [email or web address] for contact info under    Signed, Abigail Butts, PA-C  03/28/2020 1:13 PM As above, patient seen and examined.  Briefly she is a 44 year old female with past medical history of FSGS for evaluation  of non-ST elevation myocardial infarction.  No prior cardiac history.  She typically does not have dyspnea on exertion, orthopnea, PND, pedal edema, chest pain or syncope.  Approximately 1-1/2 weeks ago she had nasal congestion, nonproductive cough.  For the past 2 days she has had intermittent pain in her left breast area described as a stabbing sensation.  Lasted 10 minutes and resolve spontaneously.  Does not radiate.  There was some nausea and diaphoresis.  The pain increased with lying flat.  Her pain persisted and she presented to the emergency room and cardiology was asked to evaluate.  Note she also has chronic kidney disease and dialysis is to be initiated. Troponins are 1636, 2903, 4349, 4677 and 4691.  Creatinine is 10.78.  Hemoglobin 9.8.  White blood cell count 15.5.  Electrocardiogram shows sinus rhythm, left ventricular hypertrophy, T wave inversion in the septal leads and lateral leads.  Echocardiogram shows ejection fraction of 40% with distal septal and apical akinesis.  1 elevated troponin-patient potentially had a viral syndrome 2 weeks ago.  Based on history I would be concerned about potential for myocarditis/pericarditis.  However coronary artery disease with LAD infarct is possible.  We will treat with aspirin, statin and change labetalol to carvedilol 6.25 mg twice daily.  Add subcutaneous heparin.  Note she is not having chest pain at present.  She will require cardiac catheterization to exclude coronary disease.  The risk and benefits including myocardial infarction, CVA and death discussed and she agrees to proceed.  Note she has severe renal insufficiency but dialysis is already planned.  We therefore need not be concerned with potential contrast nephropathy.  We will touch base with nephrology to coordinate timing of catheterization with dialysis.  2 acute on chronic stage V kidney disease-dialysis is to be initiated per nephrology.  Patient is also being evaluated for potential  renal transplant.  3 hypertension-blood pressure is controlled.  However we will change to carvedilol given recent cardiac event.  4 hyperlipidemia-add statin.  Check lipids and liver in 12 weeks.  Kirk Ruths,  MD

## 2020-03-28 NOTE — Progress Notes (Signed)
  Echocardiogram 2D Echocardiogram has been performed.  Jennette Dubin 03/28/2020, 10:33 AM

## 2020-03-28 NOTE — Progress Notes (Signed)
Pt will have 1st HD tx and cannulated with both needles aspirating and flushing well. When BFR increased to 250 as ordered, venous site became infiltrated. Tx stopped, cold compress applied and Dr Joylene Grapes was notified. Pt is NAD.

## 2020-03-28 NOTE — Progress Notes (Signed)
Received call from cardiology.  HS trop increasing, plan for cardiac  Cath. Requesting HD before then.  Attempt at HD #1 made overnight--> had venous infiltration AVF looks good with good T/B, minimal bruising around venous needle site.  Will attempt HD #1 again in AM with 2 17 g needles and expert cannulator.  Pt in agreement with plan and also discussed with cardiology.    Madelon Lips MD [facility] pgr [phone]

## 2020-03-28 NOTE — Progress Notes (Signed)
Stacie Greene Progress Note   44 y.o. 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 Sharp central CP  appeared suddenly and she has only had  nausea w/ the pain. She has advanced CKD and follows with Stacie Greene in [facility].    Assessment/ Plan:    1) Chest pain c/f Uremic Pericarditis: symptoms concerning for pericarditis but no rub and EKG not w/ obvious changes. BUN not markedly elevated but given her symptoms an attempt was made at dialysis yesterday but she infiltrated almost immediately.  - Will await cardiology input but I am not convinced this is uremic pericarditis. - Would hold further RRT until necessary; also follows w/ Stacie Greene seen at [facility] for transplant eval and surgical eval. Plans for PD in the future.  - If she doesn't required RRT this hospitalization then she will need close f/u at [facility] to establish care, home therapies evaluation + PD catheter placement.  -Monitor Daily I/Os, Daily weight   2) Elevated Trop: unclear cause; could be demand, myocardial ischemia, or myopericarditis although this is uncommon from uremia. Cardiology to eval.  3) Hypertension: history of such. Can continue home labetalol for now Secondary hyperparathyroidism: Continue calcitriol   4) anemia of CKD: Continue iron   5) metabolic acidosis: Can continue sodium bicarb.     Subjective:   Feeling better and denies any further CP; denies nausea or dyspnea. No events overnight other than infiltration of the fistula.   Objective:   BP 117/74   Pulse 92   Temp 98.4 F (36.9 C) (Oral)   Resp 17   Ht 5\' 7"  (1.702 m)   Wt 74.1 kg   SpO2 99%   BMI 25.59 kg/m  No intake or output data in the 24 hours ending 03/28/20 0749 Weight change:   Physical Exam: General: well-appearing, NAD HEENT: anicteric sclera CV: RRR,  no rubs, no peripheral edema Lungs:  clear to auscultation bilaterally Abd: soft, non-tender, non-distended Skin: no visible lesions or rashes Psych: alert, engaged, appropriate  affect Musculoskeletal: no obvious deformities Neuro: normal speech, no gross focal deficits  Access: Left Cimino, infiltrated but there is a bruit  Imaging: DG Chest 2 View  Result Date: 03/27/2020 CLINICAL DATA:  Intermittent chest pain.  History of hypertension. EXAM: CHEST - 2 VIEW COMPARISON:  None. FINDINGS: Heart and mediastinal shadows are normal. The lungs are clear. The vascularity is normal. No effusions. Surgical clips in the low right neck probably related to thyroid surgery. Mild thoracolumbar scoliotic curvature. IMPRESSION: No active cardiopulmonary disease. Mild thoracolumbar scoliotic curvature. Electronically Signed   By: Nelson Chimes M.D.   On: 03/27/2020 15:26    Labs: BMET Recent Labs  Lab 03/27/20 1419 03/28/20 0424  NA 139 137  K 4.8 4.5  CL 108 105  CO2 17* 20*  GLUCOSE 106* 114*  BUN 44* 54*  CREATININE 11.16* 10.78*  CALCIUM 9.4 8.7*   CBC Recent Labs  Lab 03/27/20 1419 03/28/20 0424  WBC 14.3* 15.5*  HGB 11.2* 9.8*  HCT 35.1* 31.4*  MCV 84.4 84.0  PLT 403* 339    Medications:    . calcitRIOL  0.25 mcg Oral Q M,W,F  . Chlorhexidine Gluconate Cloth  6 each Topical Q0600  . cholecalciferol  400 Units Oral Daily  . labetalol  200 mg Oral BID  . sodium bicarbonate  650 mg Oral BID  Otelia Santee, MD 03/28/2020, 7:49 AM

## 2020-03-29 ENCOUNTER — Encounter (HOSPITAL_COMMUNITY)
Admission: EM | Disposition: A | Discharge status: Home / Self Care | Payer: Self-pay | Source: Home / Self Care | Attending: Student

## 2020-03-29 DIAGNOSIS — I214 Non-ST elevation (NSTEMI) myocardial infarction: Principal | ICD-10-CM

## 2020-03-29 DIAGNOSIS — I2510 Atherosclerotic heart disease of native coronary artery without angina pectoris: Secondary | ICD-10-CM

## 2020-03-29 DIAGNOSIS — I10 Essential (primary) hypertension: Secondary | ICD-10-CM

## 2020-03-29 DIAGNOSIS — E785 Hyperlipidemia, unspecified: Secondary | ICD-10-CM

## 2020-03-29 HISTORY — PX: CORONARY STENT INTERVENTION: CATH118234

## 2020-03-29 HISTORY — PX: INTRAVASCULAR ULTRASOUND/IVUS: CATH118244

## 2020-03-29 HISTORY — PX: LEFT HEART CATH AND CORONARY ANGIOGRAPHY: CATH118249

## 2020-03-29 LAB — COXSACKIE A VIRUS ANTIBODIES
Coxsackie A16 IgG: NEGATIVE titer
Coxsackie A16 IgM: NEGATIVE titer
Coxsackie A24 IgG: 1:800 {titer} — ABNORMAL HIGH
Coxsackie A24 IgM: NEGATIVE titer
Coxsackie A7 IgG: NEGATIVE titer
Coxsackie A7 IgM: NEGATIVE titer
Coxsackie A9 IgG: 1:800 {titer} — ABNORMAL HIGH
Coxsackie A9 IgM: NEGATIVE titer

## 2020-03-29 LAB — CBC WITH DIFFERENTIAL/PLATELET
Abs Immature Granulocytes: 0.05 10*3/uL (ref 0.00–0.07)
Basophils Absolute: 0.1 10*3/uL (ref 0.0–0.1)
Basophils Relative: 0 %
Eosinophils Absolute: 0.3 10*3/uL (ref 0.0–0.5)
Eosinophils Relative: 2 %
HCT: 30.2 % — ABNORMAL LOW (ref 36.0–46.0)
Hemoglobin: 9.9 g/dL — ABNORMAL LOW (ref 12.0–15.0)
Immature Granulocytes: 0 %
Lymphocytes Relative: 27 %
Lymphs Abs: 3.7 10*3/uL (ref 0.7–4.0)
MCH: 27.2 pg (ref 26.0–34.0)
MCHC: 32.8 g/dL (ref 30.0–36.0)
MCV: 83 fL (ref 80.0–100.0)
Monocytes Absolute: 0.9 10*3/uL (ref 0.1–1.0)
Monocytes Relative: 7 %
Neutro Abs: 8.5 10*3/uL — ABNORMAL HIGH (ref 1.7–7.7)
Neutrophils Relative %: 64 %
Platelets: 311 10*3/uL (ref 150–400)
RBC: 3.64 MIL/uL — ABNORMAL LOW (ref 3.87–5.11)
RDW: 15.4 % (ref 11.5–15.5)
WBC: 13.4 10*3/uL — ABNORMAL HIGH (ref 4.0–10.5)
nRBC: 0 % (ref 0.0–0.2)

## 2020-03-29 LAB — POCT ACTIVATED CLOTTING TIME
Activated Clotting Time: 309 seconds
Activated Clotting Time: 315 seconds
Activated Clotting Time: 267 seconds
Activated Clotting Time: 201 seconds
Activated Clotting Time: 196 seconds
Activated Clotting Time: 172 seconds

## 2020-03-29 LAB — RENAL FUNCTION PANEL
Albumin: 3.3 g/dL — ABNORMAL LOW (ref 3.5–5.0)
Anion gap: 13 (ref 5–15)
BUN: 51 mg/dL — ABNORMAL HIGH (ref 6–20)
CO2: 20 mmol/L — ABNORMAL LOW (ref 22–32)
Calcium: 8.5 mg/dL — ABNORMAL LOW (ref 8.9–10.3)
Chloride: 103 mmol/L (ref 98–111)
Creatinine, Ser: 10.6 mg/dL — ABNORMAL HIGH (ref 0.44–1.00)
GFR, Estimated: 4 mL/min — ABNORMAL LOW (ref >60)
Glucose, Bld: 90 mg/dL (ref 70–99)
Phosphorus: 5.6 mg/dL — ABNORMAL HIGH (ref 2.5–4.6)
Potassium: 4.5 mmol/L (ref 3.5–5.1)
Sodium: 136 mmol/L (ref 135–145)

## 2020-03-29 LAB — MAGNESIUM: Magnesium: 1.7 mg/dL (ref 1.7–2.4)

## 2020-03-29 LAB — EPSTEIN-BARR VIRUS VCA, IGM: EBV VCA IgM: <36 U/mL (ref 0.0–35.9)

## 2020-03-29 LAB — TSH: TSH: 2.953 u[IU]/mL (ref 0.350–4.500)

## 2020-03-29 SURGERY — LEFT HEART CATH AND CORONARY ANGIOGRAPHY
Anesthesia: LOCAL

## 2020-03-29 MED ORDER — LABETALOL HCL 5 MG/ML IV SOLN: 10.0000 mg | INTRAVENOUS | Status: AC | PRN

## 2020-03-29 MED ORDER — TICAGRELOR 90 MG PO TABS
ORAL_TABLET | ORAL | Status: AC
  Filled 2020-03-29: qty 1

## 2020-03-29 MED ORDER — SODIUM CHLORIDE 0.9% FLUSH
3.0000 mL | Freq: Two times a day (BID) | INTRAVENOUS | Status: DC
  Administered 2020-03-29 – 2020-04-03 (×5): 3 mL via INTRAVENOUS

## 2020-03-29 MED ORDER — HYDRALAZINE HCL 20 MG/ML IJ SOLN: 10.0000 mg | INTRAMUSCULAR | Status: AC | PRN

## 2020-03-29 MED ORDER — TICAGRELOR 90 MG PO TABS
ORAL_TABLET | ORAL | Status: DC | PRN
  Administered 2020-03-29: 180 mg via ORAL

## 2020-03-29 MED ORDER — HEPARIN (PORCINE) IN NACL 1000-0.9 UT/500ML-% IV SOLN
INTRAVENOUS | Status: AC
  Filled 2020-03-29: qty 1000

## 2020-03-29 MED ORDER — SODIUM CHLORIDE 0.9% FLUSH: 3.0000 mL | INTRAVENOUS | Status: DC | PRN

## 2020-03-29 MED ORDER — SODIUM CHLORIDE 0.9 % IV SOLN: 250.0000 mL | INTRAVENOUS | Status: DC | PRN

## 2020-03-29 MED ORDER — HEPARIN SODIUM (PORCINE) 1000 UNIT/ML IJ SOLN
INTRAMUSCULAR | Status: DC | PRN
  Administered 2020-03-29: 10000 [IU] via INTRAVENOUS

## 2020-03-29 MED ORDER — SODIUM CHLORIDE 0.9 % IV SOLN: INTRAVENOUS | Status: DC

## 2020-03-29 MED ORDER — ASPIRIN EC 81 MG PO TBEC
81.0000 mg | DELAYED_RELEASE_TABLET | Freq: Every day | ORAL | Status: DC
  Administered 2020-03-30 – 2020-04-03 (×5): 81 mg via ORAL
  Filled 2020-03-29 (×5): qty 1

## 2020-03-29 MED ORDER — HEPARIN (PORCINE) IN NACL 1000-0.9 UT/500ML-% IV SOLN
INTRAVENOUS | Status: DC | PRN
  Administered 2020-03-29: 500 mL

## 2020-03-29 MED ORDER — FENTANYL CITRATE (PF) 100 MCG/2ML IJ SOLN
INTRAMUSCULAR | Status: DC | PRN
  Administered 2020-03-29: 25 ug via INTRAVENOUS

## 2020-03-29 MED ORDER — NITROGLYCERIN 1 MG/10 ML FOR IR/CATH LAB
INTRA_ARTERIAL | Status: AC
  Filled 2020-03-29: qty 10

## 2020-03-29 MED ORDER — LIDOCAINE HCL (PF) 1 % IJ SOLN
INTRAMUSCULAR | Status: AC
  Filled 2020-03-29: qty 30

## 2020-03-29 MED ORDER — NITROGLYCERIN 1 MG/10 ML FOR IR/CATH LAB
INTRA_ARTERIAL | Status: DC | PRN
  Administered 2020-03-29 (×2): 200 ug via INTRACORONARY

## 2020-03-29 MED ORDER — ASPIRIN 81 MG PO CHEW
81.0000 mg | CHEWABLE_TABLET | ORAL | Status: AC
  Administered 2020-03-29: 81 mg via ORAL
  Filled 2020-03-29: qty 1

## 2020-03-29 MED ORDER — HEPARIN SODIUM (PORCINE) 1000 UNIT/ML DIALYSIS
1000.0000 [IU] | INTRAMUSCULAR | Status: DC | PRN
  Filled 2020-03-29: qty 1

## 2020-03-29 MED ORDER — TICAGRELOR 90 MG PO TABS
90.0000 mg | ORAL_TABLET | Freq: Two times a day (BID) | ORAL | Status: DC
  Administered 2020-03-29 – 2020-04-03 (×10): 90 mg via ORAL
  Filled 2020-03-29 (×10): qty 1

## 2020-03-29 MED ORDER — ATROPINE SULFATE 1 MG/10ML IJ SOSY
PREFILLED_SYRINGE | INTRAMUSCULAR | Status: AC
  Filled 2020-03-29: qty 10

## 2020-03-29 MED ORDER — LIDOCAINE HCL (PF) 1 % IJ SOLN
INTRAMUSCULAR | Status: DC | PRN
  Administered 2020-03-29: 15 mL

## 2020-03-29 MED ORDER — FENTANYL CITRATE (PF) 100 MCG/2ML IJ SOLN
INTRAMUSCULAR | Status: AC
  Filled 2020-03-29: qty 2

## 2020-03-29 MED ORDER — HEPARIN SODIUM (PORCINE) 1000 UNIT/ML IJ SOLN
INTRAMUSCULAR | Status: AC
  Filled 2020-03-29: qty 1

## 2020-03-29 MED ORDER — MIDAZOLAM HCL 2 MG/2ML IJ SOLN
INTRAMUSCULAR | Status: AC
  Filled 2020-03-29: qty 2

## 2020-03-29 MED ORDER — IOHEXOL 350 MG/ML SOLN
INTRAVENOUS | Status: DC | PRN
  Administered 2020-03-29: 110 mL

## 2020-03-29 MED ORDER — MIDAZOLAM HCL 2 MG/2ML IJ SOLN
INTRAMUSCULAR | Status: DC | PRN
  Administered 2020-03-29: 1 mg via INTRAVENOUS

## 2020-03-29 SURGICAL SUPPLY — 19 items
BALLN SAPPHIRE 2.0X12 (BALLOONS) ×2
BALLN SAPPHIRE ~~LOC~~ 3.5X12 (BALLOONS) ×1 IMPLANT
BALLOON SAPPHIRE 2.0X12 (BALLOONS) IMPLANT
CATH INFINITI 5FR MULTPACK ANG (CATHETERS) ×1 IMPLANT
CATH OPTICROSS HD (CATHETERS) ×1 IMPLANT
CATH VISTA GUIDE 6FR XBLAD3.0 (CATHETERS) ×1 IMPLANT
KIT ENCORE 26 ADVANTAGE (KITS) ×1 IMPLANT
KIT HEART LEFT (KITS) ×2 IMPLANT
PACK CARDIAC CATHETERIZATION (CUSTOM PROCEDURE TRAY) ×2 IMPLANT
SHEATH PINNACLE 5F 10CM (SHEATH) ×1 IMPLANT
SHEATH PINNACLE 6F 10CM (SHEATH) ×1 IMPLANT
SHEATH PROBE COVER 6X72 (BAG) ×1 IMPLANT
SLED PULL BACK IVUS (MISCELLANEOUS) ×1 IMPLANT
STENT SYNERGY XD 3.0X20 (Permanent Stent) IMPLANT
SYNERGY XD 3.0X20 (Permanent Stent) ×4 IMPLANT
TRANSDUCER W/STOPCOCK (MISCELLANEOUS) ×2 IMPLANT
TUBING CIL FLEX 10 FLL-RA (TUBING) ×2 IMPLANT
WIRE COUGAR XT STRL 190CM (WIRE) ×1 IMPLANT
WIRE EMERALD 3MM-J .035X150CM (WIRE) ×1 IMPLANT

## 2020-03-29 NOTE — Progress Notes (Signed)
ACT 172. In range to pull sheath.

## 2020-03-29 NOTE — TOC Initial Note (Signed)
Transition of Care [facility]) - Initial/Assessment Note    Patient Details  Name: Stacie Greene MRN: [id number] Date of Birth: 1975-10-25  Transition of Care [facility]) CM/SW Contact:    Sharin Mons, RN Phone Number: 03/29/2020, 12:43 PM  Clinical Narrative:    Presents with c/o CP, pericarditis. Hx of HTN and ESRD/left AVF.Follows w/ Dr. Wynetta Emery seen at [facility] for transplant eval and surgical eval. Plans for PD in the future.      Pt from home alone. States adult son stays with her intermittently. PTA independent with ADL's, no DME usage. Works full-time. Extended family lives in [place]. Pt without PCP. NCM shared [place]. Pt interested. Post hospital appointment arranged to establish primary care and noted on AVS.  Renal and Cardiology following...  Plan: [facility] 03/29/2020  TOC team will continue to monitor and follow for needs.....  Expected Discharge Plan: Home/Self Care Barriers to Discharge: Continued Medical Work up   Patient Goals and CMS Choice        Expected Discharge Plan and Services Expected Discharge Plan: Home/Self Care                                              Prior Living Arrangements/Services                       Activities of Daily Living Home Assistive Devices/Equipment: None ADL Screening (condition at time of admission) Patient's cognitive ability adequate to safely complete daily activities?: Yes Is the patient deaf or have difficulty hearing?: No Does the patient have difficulty seeing, even when wearing glasses/contacts?: No Does the patient have difficulty concentrating, remembering, or making decisions?: No Patient able to express need for assistance with ADLs?: Yes Does the patient have difficulty dressing or bathing?: No Independently performs ADLs?: Yes (appropriate for developmental age) Does the patient have difficulty walking or climbing stairs?: No Weakness of Legs: None Weakness of Arms/Hands:  None  Permission Sought/Granted                  Emotional Assessment              Admission diagnosis:  ESRD (end stage renal disease) ([place]) [N18.6] Elevated troponin [R77.8] Chest pain, unspecified type [R07.9] NSTEMI (non-ST elevated myocardial infarction) [facility]) [I21.4] Patient Active Problem List   Diagnosis Date Noted  . NSTEMI (non-ST elevated myocardial infarction) ([place]) 03/28/2020  . ESRD (end stage renal disease) ([place]) 03/27/2020  . Pericarditis 03/27/2020  . Elevated troponin 03/27/2020   PCP:  Patient, No Pcp Per Pharmacy:   [facility] [place], [place] - [address] AT [address] [place] [address] Phone: [phone] Fax: [phone]     Social Determinants of Health (SDOH) Interventions    Readmission Risk Interventions No flowsheet data found.

## 2020-03-29 NOTE — Progress Notes (Signed)
Site area: Right groin a 6 french arterial sheath was removed  Site Prior to Removal:  Level 0  Pressure Applied For 20 MINUTES    Bedrest Beginning at   Manual:   Yes.    Patient Status During Pull:  stable  Post Pull Groin Site:  Level 0  Post Pull Instructions Given:  Yes.    Post Pull Pulses Present:  Yes.    Dressing Applied:  Yes.    Comments:

## 2020-03-29 NOTE — Procedures (Signed)
I was present at this dialysis session. I have reviewed the session itself and made appropriate changes.   Filed Weights   03/28/20 0050 03/28/20 0220 03/29/20 0720  Weight: 73 kg 74.1 kg 75.6 kg    Recent Labs  Lab 03/29/20 0338  NA 136  K 4.5  CL 103  CO2 20*  GLUCOSE 90  BUN 51*  CREATININE 10.60*  CALCIUM 8.5*  PHOS 5.6*    Recent Labs  Lab 03/27/20 1419 03/28/20 0424 03/29/20 0338  WBC 14.3* 15.5* 13.4*  NEUTROABS  --   --  8.5*  HGB 11.2* 9.8* 9.9*  HCT 35.1* 31.4* 30.2*  MCV 84.4 84.0 83.0  PLT 403* 339 311    Scheduled Meds: . [START ON 03/30/2020] aspirin EC  81 mg Oral Daily  . atorvastatin  40 mg Oral q1800  . calcitRIOL  0.25 mcg Oral Q M,W,F  . carvedilol  6.25 mg Oral BID WC  . Chlorhexidine Gluconate Cloth  6 each Topical Q0600  . cholecalciferol  400 Units Oral Daily  . sodium bicarbonate  650 mg Oral BID  . sodium chloride flush  3 mL Intravenous Q12H   Continuous Infusions: . sodium chloride    . sodium chloride 10 mL/hr at 03/29/20 0532   PRN Meds:.sodium chloride, acetaminophen **OR** acetaminophen, ondansetron **OR** ondansetron (ZOFRAN) IV, sodium chloride flush   Gean Quint, MD [facility] 03/29/2020, 2:11 PM

## 2020-03-29 NOTE — Progress Notes (Signed)
Report given to Woody, RN  

## 2020-03-29 NOTE — Progress Notes (Signed)
Progress Note  Patient Name: Stacie Greene Date of Encounter: 03/29/2020  [facility] Cardiologist: Dr Stanford Breed  Subjective   No CP or dyspnea  Inpatient Medications    Scheduled Meds: . [START ON 03/30/2020] aspirin EC  81 mg Oral Daily  . atorvastatin  40 mg Oral q1800  . calcitRIOL  0.25 mcg Oral Q M,W,F  . carvedilol  6.25 mg Oral BID WC  . Chlorhexidine Gluconate Cloth  6 each Topical Q0600  . cholecalciferol  400 Units Oral Daily  . sodium bicarbonate  650 mg Oral BID  . sodium chloride flush  3 mL Intravenous Q12H   Continuous Infusions: . sodium chloride    . sodium chloride    . sodium chloride    . sodium chloride 10 mL/hr at 03/29/20 0532   PRN Meds: sodium chloride, sodium chloride, sodium chloride, acetaminophen **OR** acetaminophen, lidocaine (PF), lidocaine-prilocaine, ondansetron **OR** ondansetron (ZOFRAN) IV, pentafluoroprop-tetrafluoroeth, sodium chloride flush   Vital Signs    Vitals:   03/28/20 0717 03/28/20 1620 03/28/20 2020 03/29/20 0454  BP: 117/74 112/73 114/80 126/80  Pulse: 92 83 90 87  Resp: 17 14 15    Temp: 98.4 F (36.9 C) 98 F (36.7 C) 98.3 F (36.8 C)   TempSrc: Oral Oral Oral   SpO2: 99% 100% 99% 100%  Weight:      Height:        Intake/Output Summary (Last 24 hours) at 03/29/2020 0735 Last data filed at 03/28/2020 0855 Gross per 24 hour  Intake 120 ml  Output --  Net 120 ml   Last 3 Weights 03/28/2020 03/28/2020  Weight (lbs) 163 lb 5.8 oz 160 lb 15 oz  Weight (kg) 74.1 kg 73 kg      Telemetry    Sinus- Personally Reviewed   Physical Exam   GEN: No acute distress.  On dialysis Neck: No JVD Cardiac: RRR, no murmurs, rubs, or gallops.  Respiratory: Clear to auscultation bilaterally. GI: Soft, nontender, non-distended  MS: No edema Neuro:  Nonfocal  Psych: Normal affect   Labs    High Sensitivity Troponin:   Recent Labs  Lab 03/27/20 1628 03/27/20 2137 03/28/20 0424 03/28/20 0836 03/28/20 1409   TROPONINIHS 1,636* 2,903* 4,349* 4,677* 4,691*      Chemistry Recent Labs  Lab 03/27/20 1419 03/28/20 0424 03/29/20 0338  NA 139 137 136  K 4.8 4.5 4.5  CL 108 105 103  CO2 17* 20* 20*  GLUCOSE 106* 114* 90  BUN 44* 54* 51*  CREATININE 11.16* 10.78* 10.60*  CALCIUM 9.4 8.7* 8.5*  ALBUMIN  --   --  3.3*  GFRNONAA 4* 4* 4*  ANIONGAP 14 12 13      Hematology Recent Labs  Lab 03/27/20 1419 03/28/20 0424 03/29/20 0338  WBC 14.3* 15.5* 13.4*  RBC 4.16 3.74* 3.64*  HGB 11.2* 9.8* 9.9*  HCT 35.1* 31.4* 30.2*  MCV 84.4 84.0 83.0  MCH 26.9 26.2 27.2  MCHC 31.9 31.2 32.8  RDW 15.5 15.4 15.4  PLT 403* 339 311    BNP Recent Labs  Lab 03/27/20 1419  BNP 160.7*     Radiology    DG Chest 2 View  Result Date: 03/27/2020 CLINICAL DATA:  Intermittent chest pain.  History of hypertension. EXAM: CHEST - 2 VIEW COMPARISON:  None. FINDINGS: Heart and mediastinal shadows are normal. The lungs are clear. The vascularity is normal. No effusions. Surgical clips in the low right neck probably related to thyroid surgery. Mild thoracolumbar scoliotic curvature. IMPRESSION:  No active cardiopulmonary disease. Mild thoracolumbar scoliotic curvature. Electronically Signed   By: Nelson Chimes M.D.   On: 03/27/2020 15:26   ECHOCARDIOGRAM COMPLETE  Result Date: 03/28/2020    ECHOCARDIOGRAM REPORT   Patient Name:   SHAINNA FAUX Date of Exam: 03/28/2020 Medical Rec #:  [id number]     Height:       67.0 in Accession #:    [id number]    Weight:       163.4 lb Date of Birth:  October 07, 1975     BSA:          1.856 m Patient Age:    44 years      BP:           117/74 mmHg Patient Gender: F             HR:           84 bpm. Exam Location:  Inpatient Procedure: 2D Echo Indications:    Chest Pain  History:        Patient has no prior history of Echocardiogram examinations.                 Risk Factors:Hypertension.  Sonographer:    Mikki Santee RDCS (AE) Referring Phys: [id number] [place]  1. LVEF is approximately 40% with akinesis of the distal inferior, distal anterior, mid/distal septal walls.. The left ventricle has moderately decreased function. description). There is mild left ventricular hypertrophy. Left ventricular diastolic parameters are consistent with Grade I diastolic dysfunction (impaired relaxation).  2. Right ventricular systolic function is normal. The right ventricular size is normal.  3. The mitral valve is normal in structure. Trivial mitral valve regurgitation.  4. The aortic valve is normal in structure. Aortic valve regurgitation is not visualized.  5. The inferior vena cava is normal in size with greater than 50% respiratory variability, suggesting right atrial pressure of 3 mmHg. FINDINGS  Left Ventricle: LVEF is approximately 40% with akinesis of the distal inferior, distal anterior, mid/distal septal walls. The left ventricle has moderately decreased function. The left ventricle demonstrates regional wall motion abnormalities. The left ventricular internal cavity size was normal in size. There is mild left ventricular hypertrophy. Left ventricular diastolic parameters are consistent with Grade I diastolic dysfunction (impaired relaxation). Right Ventricle: The right ventricular size is normal. Right vetricular wall thickness was not assessed. Right ventricular systolic function is normal. Left Atrium: Left atrial size was normal in size. Right Atrium: Right atrial size was normal in size. Pericardium: There is no evidence of pericardial effusion. Mitral Valve: The mitral valve is normal in structure. Trivial mitral valve regurgitation. Tricuspid Valve: The tricuspid valve is normal in structure. Tricuspid valve regurgitation is trivial. Aortic Valve: The aortic valve is normal in structure. Aortic valve regurgitation is not visualized. Pulmonic Valve: The pulmonic valve was normal in structure. Pulmonic valve regurgitation is not visualized. Aorta: The aortic  root is normal in size and structure. Venous: The inferior vena cava is normal in size with greater than 50% respiratory variability, suggesting right atrial pressure of 3 mmHg. IAS/Shunts: No atrial level shunt detected by color flow Doppler.  LEFT VENTRICLE PLAX 2D LVIDd:         4.20 cm  Diastology LVIDs:         2.70 cm  LV e' medial:    5.55 cm/s LV PW:         1.40 cm  LV E/e' medial:  10.4 LV IVS:  1.40 cm  LV e' lateral:   7.07 cm/s LVOT diam:     2.10 cm  LV E/e' lateral: 8.2 LV SV:         54 LV SV Index:   29 LVOT Area:     3.46 cm  RIGHT VENTRICLE RV S prime:     10.70 cm/s TAPSE (M-mode): 1.6 cm LEFT ATRIUM             Index       RIGHT ATRIUM           Index LA diam:        3.10 cm 1.67 cm/m  RA Area:     11.70 cm LA Vol (A2C):   47.7 ml 25.71 ml/m RA Volume:   26.00 ml  14.01 ml/m LA Vol (A4C):   31.2 ml 16.81 ml/m LA Biplane Vol: 40.7 ml 21.93 ml/m  AORTIC VALVE LVOT Vmax:   86.80 cm/s LVOT Vmean:  53.000 cm/s LVOT VTI:    0.157 m  AORTA Ao Root diam: 2.80 cm MITRAL VALVE MV Area (PHT): 2.56 cm    SHUNTS MV Decel Time: 296 msec    Systemic VTI:  0.16 m MV E velocity: 57.80 cm/s  Systemic Diam: 2.10 cm MV A velocity: 58.30 cm/s MV E/A ratio:  0.99 Dorris Carnes MD Electronically signed by Dorris Carnes MD Signature Date/Time: 03/28/2020/1:33:57 PM    Final     Patient Profile     44 y.o. female with past medical history of FSGS for evaluation of non-ST elevation myocardial infarction. Symptoms began after URI. Echocardiogram shows ejection fraction of 40% with distal septal and apical akinesis.  Assessment & Plan    1 non-ST elevation myocardial infarction-echocardiogram as outlined above shows reduced LV function and distal septal/apical wall motion abnormality. This is felt possibly secondary to pericarditis/myocarditis given it occurred with recent URI. However cannot exclude coronary disease and patient is being evaluated for possible renal transplant. Therefore I feel that cardiac  catheterization/definitive evaluation is indicated. The risks and benefits of cardiac catheterization including myocardial infarction, CVA and death discussed and she agrees to proceed. Continue aspirin, carvedilol and statin.  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  3 hypertension-blood pressure controlled. Continue carvedilol.  4 hyperlipidemia-continue statin. Check lipids and liver in 12 weeks.  For questions or updates, please contact [place] Please consult [email or web address] for contact info under        Signed, Kirk Ruths, MD  03/29/2020, 7:35 AM  '

## 2020-03-29 NOTE — Progress Notes (Signed)
Called to bedside to run ACT. R groin arterial sheath Level 1 (scant bloody drainage, no hematoma or bruising noted). DP +2.   ACT result = 201  Will come back in 30-45min to retest ACT. Need <175 before sheath can be pulled per order.   Primary RN Neoma Laming notified.   Henreitta Leber, RN 03/29/20 20:20

## 2020-03-29 NOTE — Progress Notes (Signed)
R groin vascular site recheck: still Level 1 (unchanged from prior assessment)  ACT = 196  Will reassess ACT in 30-45 min.  Primary RN Neoma Laming notified.  Henreitta Leber, RN 03/29/20 2100

## 2020-03-29 NOTE — Progress Notes (Signed)
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  Pain is worse with deep breathing and coughing.  Pain is not exertional.  Never had similar pain.  In ED, hemodynamically stable.  WBC 14.3.  Hgb 11.2.  Cr 11.16.  BUN 44.  Bicarb 17.  BNP 160.7.  Troponin 1300.  EKG without acute ischemic finding or normal intervals.  She was admitted with concern for pericarditis although etiology was not clear.  Nephrology and cardiology consulted.  TTE ordered.  Although she has not had any further cardiopulmonary symptoms, troponin peaked at 4690. TTE with LVEF of 40%, akinesis of distal inferior, anterior and septal walls, G1-DD.  Cardiology consulted, and planning LHC.  Subjective: Seen and examined earlier this morning while on dialysis.  No major events overnight of this morning.  No complaints.  Denies chest pain, palpitation, dyspnea, GI or UTI symptoms.  Reports making normal amount of urine.  Objective: Vitals:   03/29/20 1615 03/29/20 1620 03/29/20 1630 03/29/20 1640  BP: 129/74 128/73 118/71 132/69  Pulse: 79 73 91 71  Resp: $Remo'16 14 14 15  'mPqyT$ Temp:      TempSrc:      SpO2: 100% 100% 100% 100%  Weight:      Height:        Intake/Output Summary (Last 24 hours) at 03/29/2020 1656 Last data filed at 03/29/2020 0930 Gross per 24 hour  Intake -  Output 0 ml  Net 0 ml   Filed Weights   03/28/20 0050 03/28/20 0220 03/29/20 0720  Weight: 73 kg 74.1 kg 75.6 kg    Examination:  GENERAL: No apparent distress.  Nontoxic. HEENT: MMM.  Vision and hearing grossly intact.  NECK: Supple.  No apparent JVD.  RESP:  No IWOB.   Fair aeration bilaterally.  Left aVF with good bruits. CVS:  RRR. Heart sounds normal.  ABD/GI/GU: BS+. Abd soft, NTND.  MSK/EXT:  Moves extremities. No apparent deformity. No edema.  SKIN: no apparent skin lesion or wound NEURO: Awake, alert and oriented appropriately.  No apparent focal neuro deficit. PSYCH: Calm. Normal affect.  Procedures:  None  Microbiology summarized: [id number] and influenza PCR nonreactive.  Assessment & Plan: Non-STEMI: presented with atypical chest pain (sharp and at rest).  Troponin trended from 1300>> 4691.  No significant finding on EKG. TTE with LVEF of 40%, akinesis of distal inferior, anterior and septal walls, G1-DD.  Initially some concern about pericarditis given atypical nature and improvement with sitting up, and leukocytosis but felt unlikely.  CRP within normal.  ESR mildly elevated.  [place]ack[place] IgG positive but no IgM.  Low suspicion for pneumonia or PE.  Fortunately, she remains free of cardiopulmonary symptoms since admission. -Appreciate help by cardiology-LHC today.  Acute combined CHF:  Does not appear fluid overloaded.  TTE as above.  Appears euvolemic.  No cardiopulmonary symptoms now. 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  Plan was for PD and renal transplant down the road.   -Started HD today. -Nephrology managing  Renal osteodystrophy/metabolic acidosis -Per nephrology.  Anemia  of renal disease: Hgb relatively stable. -Continue monitoring  Essential hypertension: Normotensive. -Now on Coreg  Leukocytosis-unspecified.  No obvious source of infection.  Improving. -Continue trending  Body mass index is 26.1 kg/m.         DVT prophylaxis:  SCDs Start: 03/27/20 1851  Code Status: Full code Family Communication: Patient and/or RN. Available if any question.  Status is: Inpatient  Remains inpatient appropriate because:Ongoing diagnostic  testing needed not appropriate for outpatient work up, Unsafe d/c plan and Inpatient level of care appropriate due to severity of illness   Dispo: The patient is from: Home              Anticipated d/c is to: Home              Anticipated d/c date is: 3 days. Needs CLIP for HD              Patient currently is not medically stable to d/c.          Consultants:  Nephrology Cardiology   Sch Meds:  Scheduled Meds: . [MAR Hold] aspirin EC  81 mg Oral Daily  . [MAR Hold] atorvastatin  40 mg Oral q1800  . [MAR Hold] calcitRIOL  0.25 mcg Oral Q M,W,F  . [MAR Hold] carvedilol  6.25 mg Oral BID WC  . [MAR Hold] Chlorhexidine Gluconate Cloth  6 each Topical Q0600  . [MAR Hold] cholecalciferol  400 Units Oral Daily  . [MAR Hold] sodium bicarbonate  650 mg Oral BID  . [MAR Hold] sodium chloride flush  3 mL Intravenous Q12H   Continuous Infusions: . sodium chloride    . sodium chloride 10 mL/hr at 03/29/20 0532   PRN Meds:.sodium chloride, [MAR Hold] acetaminophen **OR** [MAR Hold] acetaminophen, [MAR Hold] ondansetron **OR** [MAR Hold] ondansetron (ZOFRAN) IV, sodium chloride flush  Antimicrobials: Anti-infectives (From admission, onward)   None       I have personally reviewed the following labs and images: CBC: Recent Labs  Lab 03/27/20 1419 03/28/20 0424 03/29/20 0338  WBC 14.3* 15.5* 13.4*  NEUTROABS  --   --  8.5*  HGB 11.2* 9.8* 9.9*  HCT 35.1* 31.4* 30.2*  MCV 84.4 84.0 83.0  PLT 403* 339 311   BMP &GFR Recent Labs  Lab 03/27/20 1419 03/28/20 0424 03/29/20 0338  NA 139 137 136  K 4.8 4.5 4.5  CL 108 105 103  CO2 17* 20* 20*  GLUCOSE 106* 114* 90  BUN 44* 54* 51*  CREATININE 11.16* 10.78* 10.60*  CALCIUM 9.4 8.7* 8.5*  MG  --   --  1.7  PHOS  --   --  5.6*   Estimated Creatinine Clearance: 7.3 mL/min (A) (by C-G formula based on SCr of 10.6 mg/dL (H)). Liver & Pancreas: Recent Labs  Lab 03/29/20 0338  ALBUMIN 3.3*   No results for input(s):  LIPASE, AMYLASE in the last 168 hours. No results for input(s): AMMONIA in the last 168 hours. Diabetic: Recent Labs    03/28/20 1355  HGBA1C 6.1*   No results for input(s): GLUCAP in the last 168 hours. Cardiac Enzymes: No results for input(s): CKTOTAL, CKMB, CKMBINDEX, TROPONINI in the last 168 hours. No results for input(s): PROBNP in the last 8760 hours. Coagulation Profile: No results for input(s): INR, PROTIME in the last 168 hours. Thyroid Function Tests: Recent Labs    03/29/20 0338  TSH 2.953   Lipid Profile: Recent Labs    03/28/20 1409  CHOL 205*  HDL  26*  LDLCALC 144*  TRIG 174*  CHOLHDL 7.9   Anemia Panel: No results for input(s): VITAMINB12, FOLATE, FERRITIN, TIBC, IRON, RETICCTPCT in the last 72 hours. Urine analysis: No results found for: COLORURINE, APPEARANCEUR, LABSPEC, PHURINE, GLUCOSEU, HGBUR, BILIRUBINUR, KETONESUR, PROTEINUR, UROBILINOGEN, NITRITE, LEUKOCYTESUR Sepsis Labs: Invalid input(s): PROCALCITONIN, [place]  Microbiology: Recent Results (from the past 240 hour(s))  Resp Panel by RT-PCR (Flu A&B, Covid) Nasopharyngeal Swab     Status: None   Collection Time: 03/27/20  3:34 PM   Specimen: Nasopharyngeal Swab; Nasopharyngeal(NP) swabs in vial transport medium  Result Value Ref Range Status   SARS Coronavirus 2 by RT PCR NEGATIVE NEGATIVE Final    Comment: (NOTE) SARS-CoV-2 target nucleic acids are NOT DETECTED.  The SARS-CoV-2 RNA is generally detectable in upper respiratory specimens during the acute phase of infection. The lowest concentration of SARS-CoV-2 viral copies this assay can detect is 138 copies/mL. A negative result does not preclude SARS-Cov-2 infection and should not be used as the sole basis for treatment or other patient management decisions. A negative result may occur with  improper specimen collection/handling, submission of specimen other than nasopharyngeal swab, presence of viral mutation(s) within the areas  targeted by this assay, and inadequate number of viral copies(<138 copies/mL). A negative result must be combined with clinical observations, patient history, and epidemiological information. The expected result is Negative.  Fact Sheet for Patients:  EntrepreneurPulse.com.au  Fact Sheet for Healthcare Providers:  IncredibleEmployment.be  This test is no t yet approved or cleared by the [place] FDA and  has been authorized for detection and/or diagnosis of SARS-CoV-2 by FDA under an Emergency Use Authorization (EUA). This EUA will remain  in effect (meaning this test can be used) for the duration of the COVID-19 declaration under Section 564(b)(1) of the Act, 21 U.S.C.section 360bbb-3(b)(1), unless the authorization is terminated  or revoked sooner.       Influenza A by PCR NEGATIVE NEGATIVE Final   Influenza B by PCR NEGATIVE NEGATIVE Final    Comment: (NOTE) The Xpert Xpress SARS-CoV-2/FLU/RSV plus assay is intended as an aid in the diagnosis of influenza from Nasopharyngeal swab specimens and should not be used as a sole basis for treatment. Nasal washings and aspirates are unacceptable for Xpert Xpress SARS-CoV-2/FLU/RSV testing.  Fact Sheet for Patients: EntrepreneurPulse.com.au  Fact Sheet for Healthcare Providers: IncredibleEmployment.be  This test is not yet approved or cleared by the [place] FDA and has been authorized for detection and/or diagnosis of SARS-CoV-2 by FDA under an Emergency Use Authorization (EUA). This EUA will remain in effect (meaning this test can be used) for the duration of the COVID-19 declaration under Section 564(b)(1) of the Act, 21 U.S.C. section 360bbb-3(b)(1), unless the authorization is terminated or revoked.  Performed at [facility], [place] [address], [place], [place] [address]     Radiology Studies: CARDIAC CATHETERIZATION  Result Date:  03/29/2020  Mid Cx to Dist Cx lesion is 20% stenosed.  Prox LAD lesion is 99% stenosed.  Mid LAD lesion is 70% stenosed.  Dist LAD lesion is 40% stenosed.  A drug-eluting stent was successfully placed using a SYNERGY XD 3.0X20.  Post intervention, there is a 0% residual stenosis.  A drug-eluting stent was successfully placed using a SYNERGY XD 3.0X20.  Post intervention, there is a 0% residual stenosis.  1. Severe stenosis proximal LAD and mid LAD. 2. Successful PTCA/DES x 2 proximal to mid LAD (overlapping stents). IVUS optimization used post stent deployment. 3.  Mild non-obstructive disease in the Circumflex and distal LAD Recommendations: DAPT with ASA and Brilinta for one year. Continue statin and beta blocker.      Dayshia [address]  If 7PM-7AM, please contact night-coverage [email or web address] 03/29/2020, 4:56 PM

## 2020-03-29 NOTE — Interval H&P Note (Signed)
History and Physical Interval Note:  03/29/2020 2:37 PM  Stacie Greene  has presented today for surgery, with the diagnosis of nonsetemi.  The various methods of treatment have been discussed with the patient and family. After consideration of risks, benefits and other options for treatment, the patient has consented to  Procedure(s): LEFT HEART CATH AND CORONARY ANGIOGRAPHY (N/A) as a surgical intervention.  The patient's history has been reviewed, patient examined, no change in status, stable for surgery.  I have reviewed the patient's chart and labs.  Questions were answered to the patient's satisfaction.   Cath Lab Visit (complete for each Cath Lab visit)  Clinical Evaluation Leading to the Procedure:   ACS: yes  Non-ACS:    Anginal Classification: No Symptoms  Anti-ischemic medical therapy: Minimal Therapy (1 class of medications)  Non-Invasive Test Results: No non-invasive testing performed  Prior CABG: No previous CABG        Lauree Chandler

## 2020-03-29 NOTE — H&P (View-Only) (Signed)
Progress Note  Patient Name: Stacie Greene Date of Encounter: 03/29/2020  [facility] Cardiologist: Dr Stanford Breed  Subjective   No CP or dyspnea  Inpatient Medications    Scheduled Meds: . [START ON 03/30/2020] aspirin EC  81 mg Oral Daily  . atorvastatin  40 mg Oral q1800  . calcitRIOL  0.25 mcg Oral Q M,W,F  . carvedilol  6.25 mg Oral BID WC  . Chlorhexidine Gluconate Cloth  6 each Topical Q0600  . cholecalciferol  400 Units Oral Daily  . sodium bicarbonate  650 mg Oral BID  . sodium chloride flush  3 mL Intravenous Q12H   Continuous Infusions: . sodium chloride    . sodium chloride    . sodium chloride    . sodium chloride 10 mL/hr at 03/29/20 0532   PRN Meds: sodium chloride, sodium chloride, sodium chloride, acetaminophen **OR** acetaminophen, lidocaine (PF), lidocaine-prilocaine, ondansetron **OR** ondansetron (ZOFRAN) IV, pentafluoroprop-tetrafluoroeth, sodium chloride flush   Vital Signs    Vitals:   03/28/20 0717 03/28/20 1620 03/28/20 2020 03/29/20 0454  BP: 117/74 112/73 114/80 126/80  Pulse: 92 83 90 87  Resp: 17 14 15    Temp: 98.4 F (36.9 C) 98 F (36.7 C) 98.3 F (36.8 C)   TempSrc: Oral Oral Oral   SpO2: 99% 100% 99% 100%  Weight:      Height:        Intake/Output Summary (Last 24 hours) at 03/29/2020 0735 Last data filed at 03/28/2020 0855 Gross per 24 hour  Intake 120 ml  Output --  Net 120 ml   Last 3 Weights 03/28/2020 03/28/2020  Weight (lbs) 163 lb 5.8 oz 160 lb 15 oz  Weight (kg) 74.1 kg 73 kg      Telemetry    Sinus- Personally Reviewed   Physical Exam   GEN: No acute distress.  On dialysis Neck: No JVD Cardiac: RRR, no murmurs, rubs, or gallops.  Respiratory: Clear to auscultation bilaterally. GI: Soft, nontender, non-distended  MS: No edema Neuro:  Nonfocal  Psych: Normal affect   Labs    High Sensitivity Troponin:   Recent Labs  Lab 03/27/20 1628 03/27/20 2137 03/28/20 0424 03/28/20 0836 03/28/20 1409   TROPONINIHS 1,636* 2,903* 4,349* 4,677* 4,691*      Chemistry Recent Labs  Lab 03/27/20 1419 03/28/20 0424 03/29/20 0338  NA 139 137 136  K 4.8 4.5 4.5  CL 108 105 103  CO2 17* 20* 20*  GLUCOSE 106* 114* 90  BUN 44* 54* 51*  CREATININE 11.16* 10.78* 10.60*  CALCIUM 9.4 8.7* 8.5*  ALBUMIN  --   --  3.3*  GFRNONAA 4* 4* 4*  ANIONGAP 14 12 13      Hematology Recent Labs  Lab 03/27/20 1419 03/28/20 0424 03/29/20 0338  WBC 14.3* 15.5* 13.4*  RBC 4.16 3.74* 3.64*  HGB 11.2* 9.8* 9.9*  HCT 35.1* 31.4* 30.2*  MCV 84.4 84.0 83.0  MCH 26.9 26.2 27.2  MCHC 31.9 31.2 32.8  RDW 15.5 15.4 15.4  PLT 403* 339 311    BNP Recent Labs  Lab 03/27/20 1419  BNP 160.7*     Radiology    DG Chest 2 View  Result Date: 03/27/2020 CLINICAL DATA:  Intermittent chest pain.  History of hypertension. EXAM: CHEST - 2 VIEW COMPARISON:  None. FINDINGS: Heart and mediastinal shadows are normal. The lungs are clear. The vascularity is normal. No effusions. Surgical clips in the low right neck probably related to thyroid surgery. Mild thoracolumbar scoliotic curvature. IMPRESSION:  No active cardiopulmonary disease. Mild thoracolumbar scoliotic curvature. Electronically Signed   By: Nelson Chimes M.D.   On: 03/27/2020 15:26   ECHOCARDIOGRAM COMPLETE  Result Date: 03/28/2020    ECHOCARDIOGRAM REPORT   Patient Name:   Stacie Greene Date of Exam: 03/28/2020 Medical Rec #:  [id number]     Height:       67.0 in Accession #:    [id number]    Weight:       163.4 lb Date of Birth:  [birth year]     BSA:          1.856 m Patient Age:    44 years      BP:           117/74 mmHg Patient Gender: F             HR:           84 bpm. Exam Location:  Inpatient Procedure: 2D Echo Indications:    Chest Pain  History:        Patient has no prior history of Echocardiogram examinations.                 Risk Factors:Hypertension.  Sonographer:    Mikki Santee RDCS (AE) Referring Phys: [id number] [place]  1. LVEF is approximately 40% with akinesis of the distal inferior, distal anterior, mid/distal septal walls.. The left ventricle has moderately decreased function. description). There is mild left ventricular hypertrophy. Left ventricular diastolic parameters are consistent with Grade I diastolic dysfunction (impaired relaxation).  2. Right ventricular systolic function is normal. The right ventricular size is normal.  3. The mitral valve is normal in structure. Trivial mitral valve regurgitation.  4. The aortic valve is normal in structure. Aortic valve regurgitation is not visualized.  5. The inferior vena cava is normal in size with greater than 50% respiratory variability, suggesting right atrial pressure of 3 mmHg. FINDINGS  Left Ventricle: LVEF is approximately 40% with akinesis of the distal inferior, distal anterior, mid/distal septal walls. The left ventricle has moderately decreased function. The left ventricle demonstrates regional wall motion abnormalities. The left ventricular internal cavity size was normal in size. There is mild left ventricular hypertrophy. Left ventricular diastolic parameters are consistent with Grade I diastolic dysfunction (impaired relaxation). Right Ventricle: The right ventricular size is normal. Right vetricular wall thickness was not assessed. Right ventricular systolic function is normal. Left Atrium: Left atrial size was normal in size. Right Atrium: Right atrial size was normal in size. Pericardium: There is no evidence of pericardial effusion. Mitral Valve: The mitral valve is normal in structure. Trivial mitral valve regurgitation. Tricuspid Valve: The tricuspid valve is normal in structure. Tricuspid valve regurgitation is trivial. Aortic Valve: The aortic valve is normal in structure. Aortic valve regurgitation is not visualized. Pulmonic Valve: The pulmonic valve was normal in structure. Pulmonic valve regurgitation is not visualized. Aorta: The aortic  root is normal in size and structure. Venous: The inferior vena cava is normal in size with greater than 50% respiratory variability, suggesting right atrial pressure of 3 mmHg. IAS/Shunts: No atrial level shunt detected by color flow Doppler.  LEFT VENTRICLE PLAX 2D LVIDd:         4.20 cm  Diastology LVIDs:         2.70 cm  LV e' medial:    5.55 cm/s LV PW:         1.40 cm  LV E/e' medial:  10.4 LV IVS:  1.40 cm  LV e' lateral:   7.07 cm/s LVOT diam:     2.10 cm  LV E/e' lateral: 8.2 LV SV:         54 LV SV Index:   29 LVOT Area:     3.46 cm  RIGHT VENTRICLE RV S prime:     10.70 cm/s TAPSE (M-mode): 1.6 cm LEFT ATRIUM             Index       RIGHT ATRIUM           Index LA diam:        3.10 cm 1.67 cm/m  RA Area:     11.70 cm LA Vol (A2C):   47.7 ml 25.71 ml/m RA Volume:   26.00 ml  14.01 ml/m LA Vol (A4C):   31.2 ml 16.81 ml/m LA Biplane Vol: 40.7 ml 21.93 ml/m  AORTIC VALVE LVOT Vmax:   86.80 cm/s LVOT Vmean:  53.000 cm/s LVOT VTI:    0.157 m  AORTA Ao Root diam: 2.80 cm MITRAL VALVE MV Area (PHT): 2.56 cm    SHUNTS MV Decel Time: 296 msec    Systemic VTI:  0.16 m MV E velocity: 57.80 cm/s  Systemic Diam: 2.10 cm MV A velocity: 58.30 cm/s MV E/A ratio:  0.99 Dorris Carnes MD Electronically signed by Dorris Carnes MD Signature Date/Time: 03/28/2020/1:33:57 PM    Final     Patient Profile     44 y.o. female with past medical history of FSGS for evaluation of non-ST elevation myocardial infarction. Symptoms began after URI. Echocardiogram shows ejection fraction of 40% with distal septal and apical akinesis.  Assessment & Plan    1 non-ST elevation myocardial infarction-echocardiogram as outlined above shows reduced LV function and distal septal/apical wall motion abnormality. This is felt possibly secondary to pericarditis/myocarditis given it occurred with recent URI. However cannot exclude coronary disease and patient is being evaluated for possible renal transplant. Therefore I feel that cardiac  catheterization/definitive evaluation is indicated. The risks and benefits of cardiac catheterization including myocardial infarction, CVA and death discussed and she agrees to proceed. Continue aspirin, carvedilol and statin.  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  3 hypertension-blood pressure controlled. Continue carvedilol.  4 hyperlipidemia-continue statin. Check lipids and liver in 12 weeks.  For questions or updates, please contact Diedra Sinor Please consult [email or web address] for contact info under        Signed, Kirk Ruths, MD  03/29/2020, 7:35 AM  '

## 2020-03-29 NOTE — Progress Notes (Signed)
Stacie Greene Progress Note   44 y.o. 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 [facility] central CP  appeared suddenly and she has only had  nausea w/ the pain. She has advanced CKD and follows with Dr. Wynetta Emery in [facility].    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 Wynetta Emery seen at [facility] for transplant eval and surgical eval. Plans for PD in the future. -HD#1 today, next HD tomorrow -cardiac cath per cardiology -start CLIP -Avoid nephrotoxic medications including NSAIDs and iodinated intravenous contrast exposure unless the latter is absolutely indicated.  Preferred narcotic agents for pain control are hydromorphone, fentanyl, and methadone. Morphine should not be used. Avoid Baclofen and avoid oral sodium phosphate and magnesium citrate based laxatives / bowel preps. Continue strict Input and Output monitoring. Will monitor the patient closely with you and intervene or adjust therapy as indicated by changes in clinical status/labs   2) Chest Pain- NSTEMI: cardiology to plan for cardiac cath -symptoms concerning for pericarditis initially but no rub and EKG not w/ obvious changes. BUN not markedly elevated  3) Hypertension:. Can continue home labetalol for now Secondary hyperparathyroidism: Continue calcitriol   4) anemia of CKD: Continue iron   5) metabolic acidosis: Can continue sodium bicarb for now   Subjective:   Seen on HD, no acute events. Tolerating treatment, no complaints.   Objective:   BP (!) 141/90 (BP Location: Right Arm)   Pulse 87   Temp 97.9 F (36.6 C) (Oral)   Resp 11   Ht 5\' 7"  (1.702 m)   Wt 75.6 kg   SpO2 98%   BMI 26.10 kg/m   Intake/Output Summary (Last 24 hours) at 03/29/2020 1411 Last data filed at 03/29/2020 0930 Gross per 24 hour  Intake --  Output 0 ml  Net  0 ml   Weight change:   Physical Exam: General: well-appearing, NAD HEENT: anicteric sclera CV: RRR,  no rubs, no peripheral edema Lungs: clear to auscultation bilaterally Abd: soft, non-tender, non-distended Skin: no visible lesions or rashes Psych: alert, engaged, appropriate  affect Musculoskeletal: no obvious deformities Neuro: normal speech, no gross focal deficits  Access: Left Cimino, infiltrated but there is a bruit  Imaging: DG Chest 2 View  Result Date: 03/27/2020 CLINICAL DATA:  Intermittent chest pain.  History of hypertension. EXAM: CHEST - 2 VIEW COMPARISON:  None. FINDINGS: Heart and mediastinal shadows are normal. The lungs are clear. The vascularity is normal. No effusions. Surgical clips in the low right neck probably related to thyroid surgery. Mild thoracolumbar scoliotic curvature. IMPRESSION: No active cardiopulmonary disease. Mild thoracolumbar scoliotic curvature. Electronically Signed   By: Nelson Chimes M.D.   On: 03/27/2020 15:26   ECHOCARDIOGRAM COMPLETE  Result Date: 03/28/2020    ECHOCARDIOGRAM REPORT   Patient Name:   Stacie Greene Date of Exam: 03/28/2020 Medical Rec #:  [id number]     Height:       67.0 in Accession #:    [id number]    Weight:       163.4 lb Date of Birth:  23-Jan-1976     BSA:          1.856 m Patient Age:    44 years      BP:           117/74 mmHg Patient Gender: F  HR:           84 bpm. Exam Location:  Inpatient Procedure: 2D Echo Indications:    Chest Pain  History:        Patient has no prior history of Echocardiogram examinations.                 Risk Factors:Hypertension.  Sonographer:    Mikki Santee RDCS (AE) Referring Phys: [id number] [place]  1. LVEF is approximately 40% with akinesis of the distal inferior, distal anterior, mid/distal septal walls.. The left ventricle has moderately decreased function. description). There is mild left ventricular hypertrophy. Left ventricular diastolic  parameters are consistent with Grade I diastolic dysfunction (impaired relaxation).  2. Right ventricular systolic function is normal. The right ventricular size is normal.  3. The mitral valve is normal in structure. Trivial mitral valve regurgitation.  4. The aortic valve is normal in structure. Aortic valve regurgitation is not visualized.  5. The inferior vena cava is normal in size with greater than 50% respiratory variability, suggesting right atrial pressure of 3 mmHg. FINDINGS  Left Ventricle: LVEF is approximately 40% with akinesis of the distal inferior, distal anterior, mid/distal septal walls. The left ventricle has moderately decreased function. The left ventricle demonstrates regional wall motion abnormalities. The left ventricular internal cavity size was normal in size. There is mild left ventricular hypertrophy. Left ventricular diastolic parameters are consistent with Grade I diastolic dysfunction (impaired relaxation). Right Ventricle: The right ventricular size is normal. Right vetricular wall thickness was not assessed. Right ventricular systolic function is normal. Left Atrium: Left atrial size was normal in size. Right Atrium: Right atrial size was normal in size. Pericardium: There is no evidence of pericardial effusion. Mitral Valve: The mitral valve is normal in structure. Trivial mitral valve regurgitation. Tricuspid Valve: The tricuspid valve is normal in structure. Tricuspid valve regurgitation is trivial. Aortic Valve: The aortic valve is normal in structure. Aortic valve regurgitation is not visualized. Pulmonic Valve: The pulmonic valve was normal in structure. Pulmonic valve regurgitation is not visualized. Aorta: The aortic root is normal in size and structure. Venous: The inferior vena cava is normal in size with greater than 50% respiratory variability, suggesting right atrial pressure of 3 mmHg. IAS/Shunts: No atrial level shunt detected by color flow Doppler.  LEFT VENTRICLE  PLAX 2D LVIDd:         4.20 cm  Diastology LVIDs:         2.70 cm  LV e' medial:    5.55 cm/s LV PW:         1.40 cm  LV E/e' medial:  10.4 LV IVS:        1.40 cm  LV e' lateral:   7.07 cm/s LVOT diam:     2.10 cm  LV E/e' lateral: 8.2 LV SV:         54 LV SV Index:   29 LVOT Area:     3.46 cm  RIGHT VENTRICLE RV S prime:     10.70 cm/s TAPSE (M-mode): 1.6 cm LEFT ATRIUM             Index       RIGHT ATRIUM           Index LA diam:        3.10 cm 1.67 cm/m  RA Area:     11.70 cm LA Vol (A2C):   47.7 ml 25.71 ml/m RA Volume:   26.00 ml  14.01 ml/m  LA Vol (A4C):   31.2 ml 16.81 ml/m LA Biplane Vol: 40.7 ml 21.93 ml/m  AORTIC VALVE LVOT Vmax:   86.80 cm/s LVOT Vmean:  53.000 cm/s LVOT VTI:    0.157 m  AORTA Ao Root diam: 2.80 cm MITRAL VALVE MV Area (PHT): 2.56 cm    SHUNTS MV Decel Time: 296 msec    Systemic VTI:  0.16 m MV E velocity: 57.80 cm/s  Systemic Diam: 2.10 cm MV A velocity: 58.30 cm/s MV E/A ratio:  0.99 Dorris Carnes MD Electronically signed by Dorris Carnes MD Signature Date/Time: 03/28/2020/1:33:57 PM    Final     Labs: BMET Recent Labs  Lab 03/27/20 1419 03/28/20 0424 03/29/20 0338  NA 139 137 136  K 4.8 4.5 4.5  CL 108 105 103  CO2 17* 20* 20*  GLUCOSE 106* 114* 90  BUN 44* 54* 51*  CREATININE 11.16* 10.78* 10.60*  CALCIUM 9.4 8.7* 8.5*  PHOS  --   --  5.6*   CBC Recent Labs  Lab 03/27/20 1419 03/28/20 0424 03/29/20 0338  WBC 14.3* 15.5* 13.4*  NEUTROABS  --   --  8.5*  HGB 11.2* 9.8* 9.9*  HCT 35.1* 31.4* 30.2*  MCV 84.4 84.0 83.0  PLT 403* 339 311    Medications:    . [START ON 03/30/2020] aspirin EC  81 mg Oral Daily  . atorvastatin  40 mg Oral q1800  . calcitRIOL  0.25 mcg Oral Q M,W,F  . carvedilol  6.25 mg Oral BID WC  . Chlorhexidine Gluconate Cloth  6 each Topical Q0600  . cholecalciferol  400 Units Oral Daily  . sodium bicarbonate  650 mg Oral BID  . sodium chloride flush  3 mL Intravenous Q12H

## 2020-03-29 NOTE — Progress Notes (Signed)
Patient comfortable in bed, procedure explained. VSS.  Small hematona noted before before procedure, Sheath removed at 2245 pressure held for 20 minutes by Lorrin Jackson RN,  Site stable at a level 2. Post procedure education given and report given to [employer]. Tolerated procedure well. Bedrest ends at 0500.  Henreitta Leber, RN 03/29/20

## 2020-03-30 DIAGNOSIS — D72825 Bandemia: Secondary | ICD-10-CM

## 2020-03-30 DIAGNOSIS — E872 Acidosis: Secondary | ICD-10-CM

## 2020-03-30 DIAGNOSIS — I25110 Atherosclerotic heart disease of native coronary artery with unstable angina pectoris: Secondary | ICD-10-CM

## 2020-03-30 LAB — CBC
HCT: 29.3 % — ABNORMAL LOW (ref 36.0–46.0)
Hemoglobin: 9.4 g/dL — ABNORMAL LOW (ref 12.0–15.0)
MCH: 26.1 pg (ref 26.0–34.0)
MCHC: 32.1 g/dL (ref 30.0–36.0)
MCV: 81.4 fL (ref 80.0–100.0)
Platelets: 308 10*3/uL (ref 150–400)
RBC: 3.6 MIL/uL — ABNORMAL LOW (ref 3.87–5.11)
RDW: 14.8 % (ref 11.5–15.5)
WBC: 13.8 10*3/uL — ABNORMAL HIGH (ref 4.0–10.5)
nRBC: 0 % (ref 0.0–0.2)

## 2020-03-30 LAB — RENAL FUNCTION PANEL
Albumin: 3.2 g/dL — ABNORMAL LOW (ref 3.5–5.0)
Anion gap: 11 (ref 5–15)
BUN: 38 mg/dL — ABNORMAL HIGH (ref 6–20)
CO2: 21 mmol/L — ABNORMAL LOW (ref 22–32)
Calcium: 8.6 mg/dL — ABNORMAL LOW (ref 8.9–10.3)
Chloride: 101 mmol/L (ref 98–111)
Creatinine, Ser: 8.58 mg/dL — ABNORMAL HIGH (ref 0.44–1.00)
GFR, Estimated: 5 mL/min — ABNORMAL LOW (ref >60)
Glucose, Bld: 132 mg/dL — ABNORMAL HIGH (ref 70–99)
Phosphorus: 2.7 mg/dL (ref 2.5–4.6)
Potassium: 4.1 mmol/L (ref 3.5–5.1)
Sodium: 133 mmol/L — ABNORMAL LOW (ref 135–145)

## 2020-03-30 LAB — COXSACKIE B VIRUS ANTIBODIES
Coxsackie B1 Ab: 1:16 {titer} — ABNORMAL HIGH
Coxsackie B2 Ab: 1:64 {titer} — ABNORMAL HIGH
Coxsackie B3 Ab: 1:32 {titer} — ABNORMAL HIGH
Coxsackie B4 Ab: NEGATIVE
Coxsackie B5 Ab: 1:64 {titer} — ABNORMAL HIGH
Coxsackie B6 Ab: 1:32 {titer} — ABNORMAL HIGH

## 2020-03-30 LAB — MAGNESIUM: Magnesium: 1.7 mg/dL (ref 1.7–2.4)

## 2020-03-30 NOTE — Progress Notes (Signed)
CARDIAC REHAB PHASE I   PRE:  Rate/Rhythm: 89 SR  BP:  Supine:   Sitting: 131/77     SaO2: 99% RA  MODE:  Ambulation: 470 ft   POST:  Rate/Rhythm: 113 ST  BP:  Supine:   Sitting: 101/86    SaO2: 100% RA  Pt was lying in bed and was independent getting out of bed. Pt ambulated 470 ft on RA with steady gait. Pt had no concerns or complaints while ambulating. Returned pt seated in chair. Discussed MI booklet, NTG use, ASA and Brilinta, exercise guidelines, restrictions, heart healthy diet, and CRP II. Pt was receptive and performed teach back.Will send CRP II referral to [place], MS, CEP 03/30/2020

## 2020-03-30 NOTE — Progress Notes (Signed)
[facility] Progress Note   44 y.o. 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 [facility] central CP  appeared suddenly and she has only had  nausea w/ the pain. She has advanced CKD and follows with Dr. Wynetta Emery in [facility].    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 Wynetta Emery seen at [facility] for transplant eval and surgical eval. Plans for PD in the future. -HD#1 12/10. #2 today. Next treatment possible Mon or Tuesday depending on schedule -starting CLIP -LUE precautions -Avoid nephrotoxic medications including NSAIDs and iodinated intravenous contrast exposure unless the latter is absolutely indicated.  Preferred narcotic agents for pain control are hydromorphone, fentanyl, and methadone. Morphine should not be used. Avoid Baclofen and avoid oral sodium phosphate and magnesium citrate based laxatives / bowel preps. Continue strict Input and Output monitoring. Will monitor the patient closely with you and intervene or adjust therapy as indicated by changes in clinical status/labs   2) Chest Pain- NSTEMI -s/p LHC 12/10, DES x2 to prox and mid LAD. DAPT x 1 year, -symptoms concerning for pericarditis initially but no rub and EKG not w/ obvious changes. BUN not markedly elevated  3) Hypertension:. Can continue home labetalol for now  4)Secondary hyperparathyroidism: Continue calcitriol   5) anemia of CKD: Continue iron   6) metabolic acidosis: Can continue sodium bicarb for now, stop once steady state on HD   Subjective:   Status post cardiac cath 12/10 with DES x2 to proximal mid LAD.  Otherwise feels well.  Tolerating dialysis yesterday, no complaints.   Objective:   BP 131/77 (BP Location: Right Arm)   Pulse 93   Temp (P) 98.8 F (37.1 C) (Oral)   Resp 16   Ht 5\' 7"  (1.702 m)   Wt 75.1 kg   SpO2  99%   BMI 25.92 kg/m   Intake/Output Summary (Last 24 hours) at 03/30/2020 1159 Last data filed at 03/29/2020 2330 Gross per 24 hour  Intake --  Output 300 ml  Net -300 ml   Weight change:   Physical Exam: General: well-appearing, NAD HEENT: anicteric sclera CV: RRR,  no rubs, no peripheral edema Lungs: clear to auscultation bilaterally Abd: soft, non-tender, non-distended Skin: no visible lesions or rashes Psych: alert, engaged, appropriate  affect Musculoskeletal: no obvious deformities Neuro: normal speech, no gross focal deficits  Access: Left Cimino, infiltrated but there is a bruit  Imaging: CARDIAC CATHETERIZATION  Result Date: 03/29/2020  Mid Cx to Dist Cx lesion is 20% stenosed.  Prox LAD lesion is 99% stenosed.  Mid LAD lesion is 70% stenosed.  Dist LAD lesion is 40% stenosed.  A drug-eluting stent was successfully placed using a SYNERGY XD 3.0X20.  Post intervention, there is a 0% residual stenosis.  A drug-eluting stent was successfully placed using a SYNERGY XD 3.0X20.  Post intervention, there is a 0% residual stenosis.  1. Severe stenosis proximal LAD and mid LAD. 2. Successful PTCA/DES x 2 proximal to mid LAD (overlapping stents). IVUS optimization used post stent deployment. 3. Mild non-obstructive disease in the Circumflex and distal LAD Recommendations: DAPT with ASA and Brilinta for one year. Continue statin and beta blocker.    Labs: BMET Recent Labs  Lab 03/27/20 1419 03/28/20 0424 03/29/20 0338 03/30/20 0145  NA 139 137 136 133*  K 4.8 4.5 4.5 4.1  CL 108  105 103 101  CO2 17* 20* 20* 21*  GLUCOSE 106* 114* 90 132*  BUN 44* 54* 51* 38*  CREATININE 11.16* 10.78* 10.60* 8.58*  CALCIUM 9.4 8.7* 8.5* 8.6*  PHOS  --   --  5.6* 2.7   CBC Recent Labs  Lab 03/27/20 1419 03/28/20 0424 03/29/20 0338 03/30/20 0145  WBC 14.3* 15.5* 13.4* 13.8*  NEUTROABS  --   --  8.5*  --   HGB 11.2* 9.8* 9.9* 9.4*  HCT 35.1* 31.4* 30.2* 29.3*  MCV 84.4  84.0 83.0 81.4  PLT 403* 339 311 308    Medications:    . aspirin EC  81 mg Oral Daily  . atorvastatin  40 mg Oral q1800  . calcitRIOL  0.25 mcg Oral Q M,W,F  . carvedilol  6.25 mg Oral BID WC  . Chlorhexidine Gluconate Cloth  6 each Topical Q0600  . cholecalciferol  400 Units Oral Daily  . sodium bicarbonate  650 mg Oral BID  . sodium chloride flush  3 mL Intravenous Q12H  . sodium chloride flush  3 mL Intravenous Q12H  . ticagrelor  90 mg Oral BID

## 2020-03-30 NOTE — Progress Notes (Signed)
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  Pain is worse with deep breathing and coughing.  Pain is not exertional.  Never had similar pain.  In ED, hemodynamically stable.  WBC 14.3.  Hgb 11.2.  Cr 11.16.  BUN 44.  Bicarb 17.  BNP 160.7.  Troponin 1300.  EKG without acute ischemic finding or normal intervals.  She was admitted with concern for pericarditis although etiology was not clear.  Nephrology and cardiology consulted.  TTE ordered.  Patient had no further cardiopulmonary symptoms but troponin peaked at 4690. TTE with LVEF of 40%, akinesis of distal inferior, anterior and septal walls, G1-DD.  Cardiology consulted  [place] revealed 99% and 70% stenosis in proximal and mid LAD respectively.  She had DES stent to proximal and mid LAD on 03/29/2020.  Started on Brilinta and aspirin.  Nephrology started hemodialysis. Waiting on CLIP  Subjective: Seen and examined earlier this morning.  No major events overnight of this morning.  No complaints.  Denies chest pain, dyspnea, palpitation, dizziness, GI or UTI symptoms.  Objective: Vitals:   03/30/20 0510 03/30/20 0541 03/30/20 0600 03/30/20 1000  BP: 123/88 129/77  131/77  Pulse: 84   93  Resp: (P) 14 15  16   Temp: (P) 98.8 F (37.1 C)     TempSrc: (P) Oral     SpO2: 98% 100%  99%  Weight:   75.1 kg   Height:        Intake/Output Summary (Last 24 hours) at 03/30/2020 1250 Last data filed at 03/29/2020 2330 Gross per 24 hour  Intake --  Output 300 ml  Net -300 ml   Filed Weights   03/29/20 0720 03/29/20 0930 03/30/20 0600  Weight: 75.6 kg (P) 75.6 kg  75.1 kg    Examination:  GENERAL: No apparent distress.  Nontoxic. HEENT: MMM.  Vision and hearing grossly intact.  NECK: Supple.  No apparent JVD.  RESP:  No IWOB.  Fair aeration bilaterally. CVS:  RRR. Heart sounds normal.  LUE aVF with good bruits. ABD/GI/GU: BS+. Abd soft, NTND.  MSK/EXT:  Moves extremities. No apparent deformity. No edema.  SKIN: no apparent skin lesion or wound NEURO: Awake, alert and oriented appropriately.  No apparent focal neuro deficit. PSYCH: Calm. Normal affect.   Procedures:  04/18/2020-LHC revealed 99% and 70% stenosis in proximal and mid LAD respectively.  She had DES stent to proximal and mid LAD  Microbiology summarized: COVID-19 and influenza PCR nonreactive.  Assessment & Plan: Non-STEMI/CAD: presented with atypical chest pain (sharp and at rest).  Troponin trended from 1300>> 4691.  No significant finding on EKG. TTE with LVEF of 40%, akinesis of distal inferior, anterior and septal walls, G1-DD. LHC revealed 99% and 70% stenosis in proximal and mid LAD respectively.  She had DES stent to proximal and mid LAD on 03/29/2020.  Remains chest pain-free. -Continue Coreg,  -DAPT with Brilinta and aspirin for 1 year  Acute combined CHF/ICM:  Does not appear fluid overloaded.  TTE and LHC as above.  Appears euvolemic.  No cardiopulmonary symptoms now.  Reports making good amount of  urine. 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  Plan was for PD and renal transplant down the road.   -HD per nephrology -Waiting on CLIP  Renal osteodystrophy/metabolic acidosis -Per nephrology.  Anemia of renal disease: Hgb relatively stable. -Continue monitoring  Essential hypertension: Normotensive. -Now on Coreg  Leukocytosis-unspecified.  No obvious source of infection.  Improving. -Continue trending  Body mass index is 25.92 kg/m.         DVT prophylaxis:  SCD's Start: 03/29/20 1948 SCDs Start: 03/27/20  1851  Code Status: Full code Family Communication: Patient and/or RN. Available if any question.  Status is: Inpatient  Remains inpatient appropriate because:Unsafe d/c plan and Inpatient level of care appropriate due to severity of illness   Dispo: The patient is from: Home              Anticipated d/c is to: Home              Anticipated d/c date is: 3 days. Needs CLIP for HD              Patient currently is not medically stable to d/c.          Consultants:  Nephrology Cardiology   Sch Meds:  Scheduled Meds: . aspirin EC  81 mg Oral Daily  . atorvastatin  40 mg Oral q1800  . calcitRIOL  0.25 mcg Oral Q M,W,F  . carvedilol  6.25 mg Oral BID WC  . Chlorhexidine Gluconate Cloth  6 each Topical Q0600  . cholecalciferol  400 Units Oral Daily  . sodium bicarbonate  650 mg Oral BID  . sodium chloride flush  3 mL Intravenous Q12H  . sodium chloride flush  3 mL Intravenous Q12H  . ticagrelor  90 mg Oral BID   Continuous Infusions: . sodium chloride     PRN Meds:.sodium chloride, acetaminophen **OR** acetaminophen, heparin, ondansetron **OR** ondansetron (ZOFRAN) IV, sodium chloride flush  Antimicrobials: Anti-infectives (From admission, onward)   None       I have personally reviewed the following labs and images: CBC: Recent Labs  Lab 03/27/20 1419 03/28/20 0424 03/29/20 0338 03/30/20 0145  WBC 14.3* 15.5* 13.4* 13.8*  NEUTROABS  --   --  8.5*  --   HGB 11.2* 9.8* 9.9* 9.4*  HCT 35.1* 31.4* 30.2* 29.3*  MCV 84.4 84.0 83.0 81.4  PLT 403* 339 311 308   BMP &GFR Recent Labs  Lab 03/27/20 1419 03/28/20 0424 03/29/20 0338 03/30/20 0145  NA 139 137 136 133*  K 4.8 4.5 4.5 4.1  CL 108 105 103 101  CO2 17* 20* 20* 21*  GLUCOSE 106* 114* 90 132*  BUN 44* 54* 51* 38*  CREATININE 11.16* 10.78* 10.60* 8.58*  CALCIUM 9.4 8.7* 8.5* 8.6*  MG  --   --  1.7 1.7  PHOS  --   --  5.6* 2.7   Estimated Creatinine Clearance: 8.9 mL/min (A) (by C-G formula  based on SCr of 8.58 mg/dL (H)). Liver & Pancreas: Recent Labs  Lab 03/29/20 0338 03/30/20 0145  ALBUMIN 3.3* 3.2*   No results for input(s): LIPASE, AMYLASE in the last 168 hours. No results for input(s): AMMONIA in the last 168 hours. Diabetic: Recent Labs    03/28/20 1355  HGBA1C 6.1*   No results for input(s): GLUCAP in the last 168 hours. Cardiac Enzymes: No results for input(s): CKTOTAL, CKMB, CKMBINDEX, TROPONINI in the last 168 hours. No results for input(s): PROBNP in  the last 8760 hours. Coagulation Profile: No results for input(s): INR, PROTIME in the last 168 hours. Thyroid Function Tests: Recent Labs    03/29/20 0338  TSH 2.953   Lipid Profile: Recent Labs    03/28/20 1409  CHOL 205*  HDL 26*  LDLCALC 144*  TRIG 174*  CHOLHDL 7.9   Anemia Panel: No results for input(s): VITAMINB12, FOLATE, FERRITIN, TIBC, IRON, RETICCTPCT in the last 72 hours. Urine analysis: No results found for: COLORURINE, APPEARANCEUR, LABSPEC, PHURINE, GLUCOSEU, HGBUR, BILIRUBINUR, KETONESUR, PROTEINUR, UROBILINOGEN, NITRITE, LEUKOCYTESUR Sepsis Labs: Invalid input(s): PROCALCITONIN, [place]  Microbiology: Recent Results (from the past 240 hour(s))  Resp Panel by RT-PCR (Flu A&B, Covid) Nasopharyngeal Swab     Status: None   Collection Time: 03/27/20  3:34 PM   Specimen: Nasopharyngeal Swab; Nasopharyngeal(NP) swabs in vial transport medium  Result Value Ref Range Status   SARS Coronavirus 2 by RT PCR NEGATIVE NEGATIVE Final    Comment: (NOTE) SARS-CoV-2 target nucleic acids are NOT DETECTED.  The SARS-CoV-2 RNA is generally detectable in upper respiratory specimens during the acute phase of infection. The lowest concentration of SARS-CoV-2 viral copies this assay can detect is 138 copies/mL. A negative result does not preclude SARS-Cov-2 infection and should not be used as the sole basis for treatment or other patient management decisions. A negative result may occur  with  improper specimen collection/handling, submission of specimen other than nasopharyngeal swab, presence of viral mutation(s) within the areas targeted by this assay, and inadequate number of viral copies(<138 copies/mL). A negative result must be combined with clinical observations, patient history, and epidemiological information. The expected result is Negative.  Fact Sheet for Patients:  EntrepreneurPulse.com.au  Fact Sheet for Healthcare Providers:  IncredibleEmployment.be  This test is no t yet approved or cleared by the [place] FDA and  has been authorized for detection and/or diagnosis of SARS-CoV-2 by FDA under an Emergency Use Authorization (EUA). This EUA will remain  in effect (meaning this test can be used) for the duration of the COVID-19 declaration under Section 564(b)(1) of the Act, 21 U.S.C.section 360bbb-3(b)(1), unless the authorization is terminated  or revoked sooner.       Influenza A by PCR NEGATIVE NEGATIVE Final   Influenza B by PCR NEGATIVE NEGATIVE Final    Comment: (NOTE) The Xpert Xpress SARS-CoV-2/FLU/RSV plus assay is intended as an aid in the diagnosis of influenza from Nasopharyngeal swab specimens and should not be used as a sole basis for treatment. Nasal washings and aspirates are unacceptable for Xpert Xpress SARS-CoV-2/FLU/RSV testing.  Fact Sheet for Patients: EntrepreneurPulse.com.au  Fact Sheet for Healthcare Providers: IncredibleEmployment.be  This test is not yet approved or cleared by the [place] FDA and has been authorized for detection and/or diagnosis of SARS-CoV-2 by FDA under an Emergency Use Authorization (EUA). This EUA will remain in effect (meaning this test can be used) for the duration of the COVID-19 declaration under Section 564(b)(1) of the Act, 21 U.S.C. section 360bbb-3(b)(1), unless the authorization is terminated  or revoked.  Performed at [facility], [place] [address], [place], [place] [address]     Radiology Studies: CARDIAC CATHETERIZATION  Result Date: 03/29/2020  Mid Cx to Dist Cx lesion is 20% stenosed.  Prox LAD lesion is 99% stenosed.  Mid LAD lesion is 70% stenosed.  Dist LAD lesion is 40% stenosed.  A drug-eluting stent was successfully placed using a SYNERGY XD 3.0X20.  Post intervention, there is a 0% residual stenosis.  A  drug-eluting stent was successfully placed using a SYNERGY XD 3.0X20.  Post intervention, there is a 0% residual stenosis.  1. Severe stenosis proximal LAD and mid LAD. 2. Successful PTCA/DES x 2 proximal to mid LAD (overlapping stents). IVUS optimization used post stent deployment. 3. Mild non-obstructive disease in the Circumflex and distal LAD Recommendations: DAPT with ASA and Brilinta for one year. Continue statin and beta blocker.      Ardra Kuznicki T. [place]  If 7PM-7AM, please contact night-coverage [email or web address] 03/30/2020, 12:50 PM

## 2020-03-30 NOTE — Progress Notes (Signed)
Subjective:  R groin a bit sore no chest pain   Objective:  Vitals:   03/29/20 2315 03/30/20 0510 03/30/20 0541 03/30/20 0600  BP:  123/88 129/77   Pulse: 85 84    Resp: 18 (P) 14 15   Temp:  (P) 98.8 F (37.1 C)    TempSrc:  (P) Oral    SpO2: 100% 98% 100%   Weight:    75.1 kg  Height:        Intake/Output from previous day:  Intake/Output Summary (Last 24 hours) at 03/30/2020 0827 Last data filed at 03/29/2020 2330 Gross per 24 hour  Intake --  Output 300 ml  Net -300 ml    Physical Exam: Affect appropriate Healthy:  appears stated age HEENT: normal Neck supple with no adenopathy JVP normal no bruits no thyromegaly Lungs clear with no wheezing and good diaphragmatic motion Heart:  S1/S2 no murmur, no rub, gallop or click PMI normal Abdomen: benighn, BS positve, no tenderness, no AAA no bruit.  No HSM or HJR Distal pulses intact with no bruits No edema Neuro non-focal Skin warm and dry No muscular weakness RFA cath site minimal hematoma moderate tenderness no bruit   Lab Results: Basic Metabolic Panel: Recent Labs    03/29/20 0338 03/30/20 0145  NA 136 133*  K 4.5 4.1  CL 103 101  CO2 20* 21*  GLUCOSE 90 132*  BUN 51* 38*  CREATININE 10.60* 8.58*  CALCIUM 8.5* 8.6*  MG 1.7 1.7  PHOS 5.6* 2.7   Liver Function Tests: Recent Labs    03/29/20 0338 03/30/20 0145  ALBUMIN 3.3* 3.2*   No results for input(s): LIPASE, AMYLASE in the last 72 hours. CBC: Recent Labs    03/29/20 0338 03/30/20 0145  WBC 13.4* 13.8*  NEUTROABS 8.5*  --   HGB 9.9* 9.4*  HCT 30.2* 29.3*  MCV 83.0 81.4  PLT 311 308   Cardiac Enzymes: No results for input(s): CKTOTAL, CKMB, CKMBINDEX, TROPONINI in the last 72 hours. BNP: Invalid input(s): POCBNP D-Dimer: No results for input(s): DDIMER in the last 72 hours. Hemoglobin A1C: Recent Labs    03/28/20 1355  HGBA1C 6.1*   Fasting Lipid Panel: Recent Labs    03/28/20 1409  CHOL 205*  HDL 26*  LDLCALC  144*  TRIG 174*  CHOLHDL 7.9   Thyroid Function Tests: Recent Labs    03/29/20 0338  TSH 2.953   Anemia Panel: No results for input(s): VITAMINB12, FOLATE, FERRITIN, TIBC, IRON, RETICCTPCT in the last 72 hours.  Imaging: CARDIAC CATHETERIZATION  Result Date: 03/29/2020  Mid Cx to Dist Cx lesion is 20% stenosed.  Prox LAD lesion is 99% stenosed.  Mid LAD lesion is 70% stenosed.  Dist LAD lesion is 40% stenosed.  A drug-eluting stent was successfully placed using a SYNERGY XD 3.0X20.  Post intervention, there is a 0% residual stenosis.  A drug-eluting stent was successfully placed using a SYNERGY XD 3.0X20.  Post intervention, there is a 0% residual stenosis.  1. Severe stenosis proximal LAD and mid LAD. 2. Successful PTCA/DES x 2 proximal to mid LAD (overlapping stents). IVUS optimization used post stent deployment. 3. Mild non-obstructive disease in the Circumflex and distal LAD Recommendations: DAPT with ASA and Brilinta for one year. Continue statin and beta blocker.   ECHOCARDIOGRAM COMPLETE  Result Date: 03/28/2020    ECHOCARDIOGRAM REPORT   Patient Name:   Stacie Greene Date of Exam: 03/28/2020 Medical Rec #:  [id number]  Height:       67.0 in Accession #:    [id number]    Weight:       163.4 lb Date of Birth:  03-Nov-1975     BSA:          1.856 m Patient Age:    44 years      BP:           117/74 mmHg Patient Gender: F             HR:           84 bpm. Exam Location:  Inpatient Procedure: 2D Echo Indications:    Chest Pain  History:        Patient has no prior history of Echocardiogram examinations.                 Risk Factors:Hypertension.  Sonographer:    Mikki Santee RDCS (AE) Referring Phys: [id number] [place]  1. LVEF is approximately 40% with akinesis of the distal inferior, distal anterior, mid/distal septal walls.. The left ventricle has moderately decreased function. description). There is mild left ventricular hypertrophy. Left ventricular  diastolic parameters are consistent with Grade I diastolic dysfunction (impaired relaxation).  2. Right ventricular systolic function is normal. The right ventricular size is normal.  3. The mitral valve is normal in structure. Trivial mitral valve regurgitation.  4. The aortic valve is normal in structure. Aortic valve regurgitation is not visualized.  5. The inferior vena cava is normal in size with greater than 50% respiratory variability, suggesting right atrial pressure of 3 mmHg. FINDINGS  Left Ventricle: LVEF is approximately 40% with akinesis of the distal inferior, distal anterior, mid/distal septal walls. The left ventricle has moderately decreased function. The left ventricle demonstrates regional wall motion abnormalities. The left ventricular internal cavity size was normal in size. There is mild left ventricular hypertrophy. Left ventricular diastolic parameters are consistent with Grade I diastolic dysfunction (impaired relaxation). Right Ventricle: The right ventricular size is normal. Right vetricular wall thickness was not assessed. Right ventricular systolic function is normal. Left Atrium: Left atrial size was normal in size. Right Atrium: Right atrial size was normal in size. Pericardium: There is no evidence of pericardial effusion. Mitral Valve: The mitral valve is normal in structure. Trivial mitral valve regurgitation. Tricuspid Valve: The tricuspid valve is normal in structure. Tricuspid valve regurgitation is trivial. Aortic Valve: The aortic valve is normal in structure. Aortic valve regurgitation is not visualized. Pulmonic Valve: The pulmonic valve was normal in structure. Pulmonic valve regurgitation is not visualized. Aorta: The aortic root is normal in size and structure. Venous: The inferior vena cava is normal in size with greater than 50% respiratory variability, suggesting right atrial pressure of 3 mmHg. IAS/Shunts: No atrial level shunt detected by color flow Doppler.  LEFT  VENTRICLE PLAX 2D LVIDd:         4.20 cm  Diastology LVIDs:         2.70 cm  LV e' medial:    5.55 cm/s LV PW:         1.40 cm  LV E/e' medial:  10.4 LV IVS:        1.40 cm  LV e' lateral:   7.07 cm/s LVOT diam:     2.10 cm  LV E/e' lateral: 8.2 LV SV:         54 LV SV Index:   29 LVOT Area:     3.46 cm  RIGHT  VENTRICLE RV S prime:     10.70 cm/s TAPSE (M-mode): 1.6 cm LEFT ATRIUM             Index       RIGHT ATRIUM           Index LA diam:        3.10 cm 1.67 cm/m  RA Area:     11.70 cm LA Vol (A2C):   47.7 ml 25.71 ml/m RA Volume:   26.00 ml  14.01 ml/m LA Vol (A4C):   31.2 ml 16.81 ml/m LA Biplane Vol: 40.7 ml 21.93 ml/m  AORTIC VALVE LVOT Vmax:   86.80 cm/s LVOT Vmean:  53.000 cm/s LVOT VTI:    0.157 m  AORTA Ao Root diam: 2.80 cm MITRAL VALVE MV Area (PHT): 2.56 cm    SHUNTS MV Decel Time: 296 msec    Systemic VTI:  0.16 m MV E velocity: 57.80 cm/s  Systemic Diam: 2.10 cm MV A velocity: 58.30 cm/s MV E/A ratio:  0.99 Dorris Carnes MD Electronically signed by Dorris Carnes MD Signature Date/Time: 03/28/2020/1:33:57 PM    Final     Cardiac Studies:  ECG: SR LAD poor R wave progression    Telemetry:  NSR 03/30/2020   Echo: EF 40%  Medications:   . aspirin EC  81 mg Oral Daily  . atorvastatin  40 mg Oral q1800  . calcitRIOL  0.25 mcg Oral Q M,W,F  . carvedilol  6.25 mg Oral BID WC  . Chlorhexidine Gluconate Cloth  6 each Topical Q0600  . cholecalciferol  400 Units Oral Daily  . sodium bicarbonate  650 mg Oral BID  . sodium chloride flush  3 mL Intravenous Q12H  . sodium chloride flush  3 mL Intravenous Q12H  . ticagrelor  90 mg Oral BID     . sodium chloride      Assessment/Plan:   1. CAD:  Post stenting proximal LAD continue DAT for a year Coreg and statin Will arrange outpatient f/u with  Dr Stanford Breed TOC 2 weeks  2. CRF:  ? Dialysis today  3. HLD  Continue statin   Ok to d/c home from cardiology stand point   Jenkins Rouge 03/30/2020, 8:27 AM

## 2020-03-31 NOTE — Progress Notes (Signed)
Subjective:  Fistual sight a bit sore ? D/c delayed to arrange outpatient dialysis   Objective:  Vitals:   03/30/20 1737 03/30/20 1743 03/30/20 2000 03/31/20 0800  BP:  125/76 120/72 132/80  Pulse:  92 95 90  Resp: 18  18 18   Temp:   98.7 F (37.1 C) 98.9 F (37.2 C)  TempSrc:   Oral Oral  SpO2:   100% 100%  Weight:      Height:        Intake/Output from previous day:  Intake/Output Summary (Last 24 hours) at 03/31/2020 6606 Last data filed at 03/31/2020 0800 Gross per 24 hour  Intake 720 ml  Output 173 ml  Net 547 ml    Physical Exam: Affect appropriate Healthy:  appears stated age HEENT: normal Neck supple with no adenopathy JVP normal no bruits no thyromegaly Lungs clear with no wheezing and good diaphragmatic motion Heart:  S1/S2 no murmur, no rub, gallop or click PMI normal Abdomen: benighn, BS positve, no tenderness, no AAA no bruit.  No HSM or HJR Distal pulses intact with no bruits No edema Neuro non-focal Skin warm and dry No muscular weakness RFA cath site minimal hematoma moderate tenderness no bruit   Lab Results: Basic Metabolic Panel: Recent Labs    03/29/20 0338 03/30/20 0145  NA 136 133*  K 4.5 4.1  CL 103 101  CO2 20* 21*  GLUCOSE 90 132*  BUN 51* 38*  CREATININE 10.60* 8.58*  CALCIUM 8.5* 8.6*  MG 1.7 1.7  PHOS 5.6* 2.7   Liver Function Tests: Recent Labs    03/29/20 0338 03/30/20 0145  ALBUMIN 3.3* 3.2*   No results for input(s): LIPASE, AMYLASE in the last 72 hours. CBC: Recent Labs    03/29/20 0338 03/30/20 0145  WBC 13.4* 13.8*  NEUTROABS 8.5*  --   HGB 9.9* 9.4*  HCT 30.2* 29.3*  MCV 83.0 81.4  PLT 311 308   Cardiac Enzymes: No results for input(s): CKTOTAL, CKMB, CKMBINDEX, TROPONINI in the last 72 hours. BNP: Invalid input(s): POCBNP D-Dimer: No results for input(s): DDIMER in the last 72 hours. Hemoglobin A1C: Recent Labs    03/28/20 1355  HGBA1C 6.1*   Fasting Lipid Panel: Recent Labs     03/28/20 1409  CHOL 205*  HDL 26*  LDLCALC 144*  TRIG 174*  CHOLHDL 7.9   Thyroid Function Tests: Recent Labs    03/29/20 0338  TSH 2.953   Anemia Panel: No results for input(s): VITAMINB12, FOLATE, FERRITIN, TIBC, IRON, RETICCTPCT in the last 72 hours.  Imaging: CARDIAC CATHETERIZATION  Result Date: 03/29/2020  Mid Cx to Dist Cx lesion is 20% stenosed.  Prox LAD lesion is 99% stenosed.  Mid LAD lesion is 70% stenosed.  Dist LAD lesion is 40% stenosed.  A drug-eluting stent was successfully placed using a SYNERGY XD 3.0X20.  Post intervention, there is a 0% residual stenosis.  A drug-eluting stent was successfully placed using a SYNERGY XD 3.0X20.  Post intervention, there is a 0% residual stenosis.  1. Severe stenosis proximal LAD and mid LAD. 2. Successful PTCA/DES x 2 proximal to mid LAD (overlapping stents). IVUS optimization used post stent deployment. 3. Mild non-obstructive disease in the Circumflex and distal LAD Recommendations: DAPT with ASA and Brilinta for one year. Continue statin and beta blocker.    Cardiac Studies:  ECG: SR LAD poor R wave progression    Telemetry:  NSR 03/31/2020   Echo: EF 40%  Medications:   .  aspirin EC  81 mg Oral Daily  . atorvastatin  40 mg Oral q1800  . calcitRIOL  0.25 mcg Oral Q M,W,F  . carvedilol  6.25 mg Oral BID WC  . Chlorhexidine Gluconate Cloth  6 each Topical Q0600  . cholecalciferol  400 Units Oral Daily  . sodium bicarbonate  650 mg Oral BID  . sodium chloride flush  3 mL Intravenous Q12H  . sodium chloride flush  3 mL Intravenous Q12H  . ticagrelor  90 mg Oral BID     . sodium chloride      Assessment/Plan:   1. CAD:  Post stenting proximal LAD continue DAT for a year Coreg and statin Will arrange outpatient f/u with  Dr Stanford Breed TOC 2 weeks  2. CRF:  Dialysis yesterday some pain at access sight arranging outpatient f/u  3. HLD  Continue statin   Ok to d/c home from cardiology stand point will sign  off  Jenkins Rouge 03/31/2020, 9:29 AM

## 2020-03-31 NOTE — Progress Notes (Signed)
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  Pain is worse with deep breathing and coughing.  Pain is not exertional.  Never had similar pain.  In ED, hemodynamically stable.  WBC 14.3.  Hgb 11.2.  Cr 11.16.  BUN 44.  Bicarb 17.  BNP 160.7.  Troponin 1300.  EKG without acute ischemic finding or normal intervals.  She was admitted with concern for pericarditis although etiology was not clear.  Nephrology and cardiology consulted.  TTE ordered.  Patient had no further cardiopulmonary symptoms but troponin peaked at 4690. TTE with LVEF of 40%, akinesis of distal inferior, anterior and septal walls, G1-DD.  Cardiology consulted  [place]ry revealed 99% and 70% stenosis in proximal and mid LAD respectively.  She had DES stent to proximal and mid LAD on 03/29/2020.  Started on Brilinta and aspirin.  Nephrology started hemodialysis. Waiting on CLIP  Subjective: Seen and examined earlier this morning.  No major events overnight of this morning.  She reports some residual swelling around the AV fistula but not painful.  Denies chest pain, dyspnea, palpitation or dizziness.  Objective: Vitals:   03/30/20 1737 03/30/20 1743 03/30/20 2000 03/31/20 0800  BP:  125/76 120/72 132/80  Pulse:  92 95 90  Resp: 18  18 18   Temp:   98.7 F (37.1 C) 98.9 F (37.2 C)  TempSrc:   Oral Oral  SpO2:   100% 100%  Weight:      Height:        Intake/Output Summary (Last 24 hours) at 03/31/2020 1223 Last data filed at 03/31/2020 0800 Gross per 24 hour  Intake 720 ml  Output 173 ml  Net 547 ml   Filed Weights   03/29/20 0930 03/30/20  0600 03/30/20 1644  Weight: (P) 75.6 kg 75.1 kg 77.6 kg    Examination:  GENERAL: No apparent distress.  Nontoxic. HEENT: MMM.  Vision and hearing grossly intact.  NECK: Supple.  No apparent JVD.  RESP: On RA.  No IWOB.  Fair aeration bilaterally. CVS:  RRR. Heart sounds normal.  ABD/GI/GU: BS+. Abd soft, NTND.  LUE aVF with good bruits. MSK/EXT:  Moves extremities.  aVF on LUE.  Mild swelling but no tenderness or erythema. SKIN: no apparent skin lesion or wound NEURO: Awake, alert and oriented appropriately.  No apparent focal neuro deficit. PSYCH: Calm. Normal affect.   Procedures:  04/18/2020-LHC revealed 99% and 70% stenosis in proximal and mid LAD respectively.  She had DES stent to proximal and mid LAD  Microbiology summarized: COVID-19 and influenza PCR nonreactive.  Assessment & Plan: Non-STEMI/CAD: presented with atypical chest pain (sharp and at rest).  Troponin trended from 1300>> 4691.  No significant finding on EKG. TTE with LVEF of 40%, akinesis of distal inferior, anterior and septal walls, G1-DD. LHC revealed 99% and 70% stenosis in proximal and mid LAD respectively.  She had DES stent to proximal and mid LAD on 03/29/2020.  Remains chest pain-free. -Continue Coreg -DAPT with Brilinta and aspirin for 1 year -Cardiology to arrange outpatient follow-up.  Acute combined CHF/ICM:  Does not appear fluid overloaded.  TTE and LHC as above.  Appears euvolemic.  No cardiopulmonary symptoms now.  Reports making good amount of urine. 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  Plan was for PD and renal transplant down the road.   -HD per nephrology-had HD on 12/11. -Waiting on CLIP  Renal osteodystrophy/metabolic acidosis -Per nephrology.  Anemia of renal disease: Hgb relatively stable. -Continue monitoring  Essential hypertension: Normotensive. -Now on Coreg  Leukocytosis-unspecified.  No obvious source of infection.   Improving. -Continue trending  Body mass index is 26.79 kg/m.         DVT prophylaxis:  SCD's Start: 03/29/20 1948 SCDs Start: 03/27/20 1851  Code Status: Full code Family Communication: Patient and/or RN. Available if any question.  Status is: Inpatient  Remains inpatient appropriate because:Unsafe d/c plan and Inpatient level of care appropriate due to severity of illness   Dispo: The patient is from: Home              Anticipated d/c is to: Home              Anticipated d/c date is: 2 days. Needs CLIP for HD              Patient currently is not medically stable to d/c.          Consultants:  Nephrology Cardiology   Sch Meds:  Scheduled Meds: . aspirin EC  81 mg Oral Daily  . atorvastatin  40 mg Oral q1800  . calcitRIOL  0.25 mcg Oral Q M,W,F  . carvedilol  6.25 mg Oral BID WC  . Chlorhexidine Gluconate Cloth  6 each Topical Q0600  . cholecalciferol  400 Units Oral Daily  . sodium bicarbonate  650 mg Oral BID  . sodium chloride flush  3 mL Intravenous Q12H  . sodium chloride flush  3 mL Intravenous Q12H  . ticagrelor  90 mg Oral BID   Continuous Infusions: . sodium chloride     PRN Meds:.sodium chloride, acetaminophen **OR** acetaminophen, ondansetron **OR** ondansetron (ZOFRAN) IV, sodium chloride flush  Antimicrobials: Anti-infectives (From admission, onward)   None       I have personally reviewed the following labs and images: CBC: Recent Labs  Lab 03/27/20 1419 03/28/20 0424 03/29/20 0338 03/30/20 0145  WBC 14.3* 15.5* 13.4* 13.8*  NEUTROABS  --   --  8.5*  --   HGB 11.2* 9.8* 9.9* 9.4*  HCT 35.1* 31.4* 30.2* 29.3*  MCV 84.4 84.0 83.0 81.4  PLT 403* 339 311 308   BMP &GFR Recent Labs  Lab 03/27/20 1419 03/28/20 0424 03/29/20 0338 03/30/20 0145  NA 139 137 136 133*  K 4.8 4.5 4.5 4.1  CL 108 105 103 101  CO2 17* 20* 20* 21*  GLUCOSE 106* 114* 90 132*  BUN 44* 54* 51* 38*  CREATININE 11.16* 10.78* 10.60* 8.58*  CALCIUM  9.4 8.7* 8.5* 8.6*  MG  --   --  1.7 1.7  PHOS  --   --  5.6* 2.7   Estimated Creatinine Clearance: 9.1 mL/min (A) (by C-G formula based on SCr of 8.58 mg/dL (H)). Liver & Pancreas: Recent Labs  Lab 03/29/20 0338 03/30/20 0145  ALBUMIN 3.3* 3.2*   No results for input(s): LIPASE, AMYLASE in the last 168 hours. No results for input(s): AMMONIA in the last 168 hours. Diabetic: Recent Labs    03/28/20 1355  HGBA1C 6.1*   No results for input(s): GLUCAP in the last 168 hours.  Cardiac Enzymes: No results for input(s): CKTOTAL, CKMB, CKMBINDEX, TROPONINI in the last 168 hours. No results for input(s): PROBNP in the last 8760 hours. Coagulation Profile: No results for input(s): INR, PROTIME in the last 168 hours. Thyroid Function Tests: Recent Labs    03/29/20 0338  TSH 2.953   Lipid Profile: Recent Labs    03/28/20 1409  CHOL 205*  HDL 26*  LDLCALC 144*  TRIG 174*  CHOLHDL 7.9   Anemia Panel: No results for input(s): VITAMINB12, FOLATE, FERRITIN, TIBC, IRON, RETICCTPCT in the last 72 hours. Urine analysis: No results found for: COLORURINE, APPEARANCEUR, LABSPEC, PHURINE, GLUCOSEU, HGBUR, BILIRUBINUR, KETONESUR, PROTEINUR, UROBILINOGEN, NITRITE, LEUKOCYTESUR Sepsis Labs: Invalid input(s): PROCALCITONIN, [place]  Microbiology: Recent Results (from the past 240 hour(s))  Resp Panel by RT-PCR (Flu A&B, Covid) Nasopharyngeal Swab     Status: None   Collection Time: 03/27/20  3:34 PM   Specimen: Nasopharyngeal Swab; Nasopharyngeal(NP) swabs in vial transport medium  Result Value Ref Range Status   SARS Coronavirus 2 by RT PCR NEGATIVE NEGATIVE Final    Comment: (NOTE) SARS-CoV-2 target nucleic acids are NOT DETECTED.  The SARS-CoV-2 RNA is generally detectable in upper respiratory specimens during the acute phase of infection. The lowest concentration of SARS-CoV-2 viral copies this assay can detect is 138 copies/mL. A negative result does not preclude  SARS-Cov-2 infection and should not be used as the sole basis for treatment or other patient management decisions. A negative result may occur with  improper specimen collection/handling, submission of specimen other than nasopharyngeal swab, presence of viral mutation(s) within the areas targeted by this assay, and inadequate number of viral copies(<138 copies/mL). A negative result must be combined with clinical observations, patient history, and epidemiological information. The expected result is Negative.  Fact Sheet for Patients:  EntrepreneurPulse.com.au  Fact Sheet for Healthcare Providers:  IncredibleEmployment.be  This test is no t yet approved or cleared by the [place] FDA and  has been authorized for detection and/or diagnosis of SARS-CoV-2 by FDA under an Emergency Use Authorization (EUA). This EUA will remain  in effect (meaning this test can be used) for the duration of the COVID-19 declaration under Section 564(b)(1) of the Act, 21 U.S.C.section 360bbb-3(b)(1), unless the authorization is terminated  or revoked sooner.       Influenza A by PCR NEGATIVE NEGATIVE Final   Influenza B by PCR NEGATIVE NEGATIVE Final    Comment: (NOTE) The Xpert Xpress SARS-CoV-2/FLU/RSV plus assay is intended as an aid in the diagnosis of influenza from Nasopharyngeal swab specimens and should not be used as a sole basis for treatment. Nasal washings and aspirates are unacceptable for Xpert Xpress SARS-CoV-2/FLU/RSV testing.  Fact Sheet for Patients: EntrepreneurPulse.com.au  Fact Sheet for Healthcare Providers: IncredibleEmployment.be  This test is not yet approved or cleared by the [place] FDA and has been authorized for detection and/or diagnosis of SARS-CoV-2 by FDA under an Emergency Use Authorization (EUA). This EUA will remain in effect (meaning this test can be used) for the duration of  the COVID-19 declaration under Section 564(b)(1) of the Act, 21 U.S.C. section 360bbb-3(b)(1), unless the authorization is terminated or revoked.  Performed at [facility], [place] [address] [place], [place] [address]     Radiology Studies: No results found.    Tyreisha Ungar T. [place]  If 7PM-7AM, please contact night-coverage [email or web address] 03/31/2020, 12:23 PM

## 2020-03-31 NOTE — Progress Notes (Signed)
[facility] Progress Note   44 y.o. 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 Sharp central CP  appeared suddenly and she has only had  nausea w/ the pain. She has advanced CKD and follows with Dr. Wynetta Emery in [facility].    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 Wynetta Emery seen at [facility] for transplant eval and surgical eval. Plans for PD in the future. -HD#1 12/10. Next treatment tomorrow -starting CLIP -LUE precautions -Avoid nephrotoxic medications including NSAIDs and iodinated intravenous contrast exposure unless the latter is absolutely indicated.  Preferred narcotic agents for pain control are hydromorphone, fentanyl, and methadone. Morphine should not be used. Avoid Baclofen and avoid oral sodium phosphate and magnesium citrate based laxatives / bowel preps. Continue strict Input and Output monitoring. Will monitor the patient closely with you and intervene or adjust therapy as indicated by changes in clinical status/labs   2) Chest Pain- NSTEMI -s/p LHC 12/10, DES x2 to prox and mid LAD. DAPT x 1 year, -symptoms concerning for pericarditis initially but no rub and EKG not w/ obvious changes. BUN not markedly elevated  3) Hypertension:. Can continue home labetalol for now  4)Secondary hyperparathyroidism: Continue calcitriol   5) anemia of CKD: Continue iron   6) metabolic acidosis: Can continue sodium bicarb for now, stop once steady state on HD   Subjective:   No complaints, feels well. Tolerated hd yesterday, net uf 173mL   Objective:   BP 134/73 (BP Location: Right Arm)   Pulse 81   Temp 97.8 F (36.6 C) (Oral)   Resp 18   Ht 5\' 7"  (1.702 m)   Wt 77.6 kg   SpO2 100%   BMI 26.79 kg/m   Intake/Output Summary (Last 24 hours) at 03/31/2020 1513 Last data filed at 03/31/2020  1100 Gross per 24 hour  Intake 1080 ml  Output 173 ml  Net 907 ml   Weight change: 2 kg  Physical Exam: General: well-appearing, NAD HEENT: anicteric sclera CV: RRR,  no rubs, no peripheral edema Lungs: clear to auscultation bilaterally Abd: soft, non-tender, non-distended Skin: no visible lesions or rashes Psych: alert, engaged, appropriate  affect Musculoskeletal: no obvious deformities Neuro: normal speech, no gross focal deficits  Access: Left Cimino w/ +b/t. Slightly swollen from time of infiltration (stable)  Imaging: No results found.  Labs: BMET Recent Labs  Lab 03/27/20 1419 03/28/20 0424 03/29/20 0338 03/30/20 0145  NA 139 137 136 133*  K 4.8 4.5 4.5 4.1  CL 108 105 103 101  CO2 17* 20* 20* 21*  GLUCOSE 106* 114* 90 132*  BUN 44* 54* 51* 38*  CREATININE 11.16* 10.78* 10.60* 8.58*  CALCIUM 9.4 8.7* 8.5* 8.6*  PHOS  --   --  5.6* 2.7   CBC Recent Labs  Lab 03/27/20 1419 03/28/20 0424 03/29/20 0338 03/30/20 0145  WBC 14.3* 15.5* 13.4* 13.8*  NEUTROABS  --   --  8.5*  --   HGB 11.2* 9.8* 9.9* 9.4*  HCT 35.1* 31.4* 30.2* 29.3*  MCV 84.4 84.0 83.0 81.4  PLT 403* 339 311 308    Medications:    . aspirin EC  81 mg Oral Daily  . atorvastatin  40 mg Oral q1800  . calcitRIOL  0.25 mcg Oral Q M,W,F  . carvedilol  6.25 mg Oral BID WC  . Chlorhexidine  Gluconate Cloth  6 each Topical [id number]  . cholecalciferol  400 Units Oral Daily  . sodium bicarbonate  650 mg Oral BID  . sodium chloride flush  3 mL Intravenous Q12H  . sodium chloride flush  3 mL Intravenous Q12H  . ticagrelor  90 mg Oral BID

## 2020-04-01 ENCOUNTER — Encounter (HOSPITAL_COMMUNITY): Payer: Self-pay | Admitting: Cardiovascular Disease

## 2020-04-01 ENCOUNTER — Telehealth: Payer: Self-pay | Admitting: Cardiology

## 2020-04-01 MED ORDER — DARBEPOETIN ALFA 60 MCG/0.3ML IJ SOSY
60.0000 ug | PREFILLED_SYRINGE | INTRAMUSCULAR | Status: DC
  Administered 2020-04-02: 60 ug via INTRAVENOUS

## 2020-04-01 MED ORDER — PNEUMOCOCCAL 13-VAL CONJ VACC IM SUSP
0.5000 mL | INTRAMUSCULAR | Status: DC
  Filled 2020-04-01: qty 0.5

## 2020-04-01 MED ORDER — INFLUENZA VAC SPLIT QUAD 0.5 ML IM SUSY
0.5000 mL | PREFILLED_SYRINGE | INTRAMUSCULAR | Status: AC
  Administered 2020-04-03: 0.5 mL via INTRAMUSCULAR
  Filled 2020-04-01: qty 0.5

## 2020-04-01 NOTE — Progress Notes (Signed)
CARDIAC REHAB PHASE I   PRE:  Rate/Rhythm: 84 SR    BP: sitting 132/86    SaO2: 100 RA  MODE:  Ambulation: 800 ft   POST:  Rate/Rhythm: 106 ST    BP: sitting 143/89     SaO2:   Tolerated well, no c/o. Good understanding of education. Encouraged her to talk with RD at HD. [phone]   [place], ACSM 04/01/2020 11:46 AM

## 2020-04-01 NOTE — Plan of Care (Signed)
  Problem: Education: Goal: Knowledge of General Education information will improve Description Including pain rating scale, medication(s)/side effects and non-pharmacologic comfort measures Outcome: Progressing   

## 2020-04-01 NOTE — Telephone Encounter (Signed)
spoke to the patient. Patient is still admitted into the hospital .  Per patient she is awaiting to find a place to have dialysis. office will call once discharge

## 2020-04-01 NOTE — Progress Notes (Signed)
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  Pain is worse with deep breathing and coughing.  Pain is not exertional.  Never had similar pain.  In ED, hemodynamically stable.  WBC 14.3.  Hgb 11.2.  Cr 11.16.  BUN 44.  Bicarb 17.  BNP 160.7.  Troponin 1300.  EKG without acute ischemic finding or normal intervals.  She was admitted with concern for pericarditis although etiology was not clear.  Nephrology and cardiology consulted.  TTE ordered.  Patient had no further cardiopulmonary symptoms but troponin peaked at 4690. TTE with LVEF of 40%, akinesis of distal inferior, anterior and septal walls, G1-DD.  Cardiology consulted  [place] revealed 99% and 70% stenosis in proximal and mid LAD respectively.  She had DES stent to proximal and mid LAD on 03/29/2020.  Started on Brilinta and aspirin.  Nephrology started hemodialysis. Waiting on CLIP  Subjective: Seen and examined earlier this morning.  No major events overnight of this morning.  No complaints.  Left arm swelling improved.  Objective: Vitals:   03/31/20 1244 03/31/20 1711 03/31/20 1941 04/01/20 0431  BP: 134/73 133/73 137/80 129/77  Pulse: 81 88 83 90  Resp: 18  12 12   Temp: 97.8 F (36.6 C)  98.3 F (36.8 C) 98.7 F (37.1 C)  TempSrc: Oral  Oral Oral  SpO2: 100%  100% 100%  Weight:      Height:        Intake/Output Summary (Last 24 hours) at 04/01/2020 1331 Last data filed at 03/31/2020 1700 Gross per 24 hour  Intake 480 ml  Output --  Net 480 ml   Filed Weights   03/29/20 0930 03/30/20 0600 03/30/20 1644  Weight: (P) 75.6 kg 75.1 kg 77.6 kg     Examination:  GENERAL: No apparent distress.  Nontoxic. HEENT: MMM.  Vision and hearing grossly intact.  NECK: Supple.  No apparent JVD.  RESP:  No IWOB.  Fair aeration bilaterally. CVS:  RRR. Heart sounds normal.  ABD/GI/GU: BS+. Abd soft, NTND.  LUE aVF with good bruits. MSK/EXT:  Moves extremities. No apparent deformity.  Mild residual swelling in LUE. SKIN: no apparent skin lesion or wound NEURO: Awake, alert and oriented appropriately.  No apparent focal neuro deficit. PSYCH: Calm. Normal affect.   Procedures:  04/18/2020-LHC revealed 99% and 70% stenosis in proximal and mid LAD respectively.  She had DES stent to proximal and mid LAD  Microbiology summarized: COVID-19 and influenza PCR nonreactive.  Assessment & Plan: Non-STEMI/CAD: presented with atypical chest pain (sharp and at rest).  Troponin trended from 1300>> 4691.  No significant finding on EKG. TTE with LVEF of 40%, akinesis of distal inferior, anterior and septal walls, G1-DD. LHC revealed 99% and 70% stenosis in proximal and mid LAD respectively.  She had DES stent to proximal and mid LAD on 03/29/2020.  Remains chest pain-free. -Continue Coreg -DAPT with Brilinta and aspirin for 1 year -Cardiology to arrange outpatient follow-up.  Acute combined CHF/ICM:  Does not appear fluid overloaded.  TTE and LHC as above.  Appears euvolemic.  No cardiopulmonary symptoms now.  Reports making  good amount of urine. 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  Plan was for PD and renal transplant down the road.  Now on HD. -HD per nephrology-had HD on 12/11. -Waiting on CLIP  Renal osteodystrophy/metabolic acidosis -Per nephrology.  Anemia of renal disease: Hgb relatively stable. -Continue monitoring  Essential hypertension: Normotensive. -Now on Coreg  Leukocytosis-unspecified.  No obvious source of infection.  Improving. -Continue trending  Body mass index is 26.79 kg/m.          DVT prophylaxis:  SCD's Start: 03/29/20 1948 SCDs Start: 03/27/20 1851  Code Status: Full code Family Communication: Patient and/or RN. Available if any question.  Status is: Inpatient  Remains inpatient appropriate because:Unsafe d/c plan and Inpatient level of care appropriate due to severity of illness   Dispo: The patient is from: Home              Anticipated d/c is to: Home              Anticipated d/c date is: 2 days. Needs CLIP for HD              Patient currently is not medically stable to d/c.          Consultants:  Nephrology Cardiology   Sch Meds:  Scheduled Meds: . aspirin EC  81 mg Oral Daily  . atorvastatin  40 mg Oral q1800  . calcitRIOL  0.25 mcg Oral Q M,W,F  . carvedilol  6.25 mg Oral BID WC  . Chlorhexidine Gluconate Cloth  6 each Topical Q0600  . cholecalciferol  400 Units Oral Daily  . [START ON 04/02/2020] darbepoetin (ARANESP) injection - DIALYSIS  60 mcg Intravenous Q Tue-HD  . sodium bicarbonate  650 mg Oral BID  . sodium chloride flush  3 mL Intravenous Q12H  . sodium chloride flush  3 mL Intravenous Q12H  . ticagrelor  90 mg Oral BID   Continuous Infusions: . sodium chloride     PRN Meds:.sodium chloride, acetaminophen **OR** acetaminophen, ondansetron **OR** ondansetron (ZOFRAN) IV, sodium chloride flush  Antimicrobials: Anti-infectives (From admission, onward)   None       I have personally reviewed the following labs and images: CBC: Recent Labs  Lab 03/27/20 1419 03/28/20 0424 03/29/20 0338 03/30/20 0145  WBC 14.3* 15.5* 13.4* 13.8*  NEUTROABS  --   --  8.5*  --   HGB 11.2* 9.8* 9.9* 9.4*  HCT 35.1* 31.4* 30.2* 29.3*  MCV 84.4 84.0 83.0 81.4  PLT 403* 339 311 308   BMP &GFR Recent Labs  Lab 03/27/20 1419 03/28/20 0424 03/29/20 0338 03/30/20 0145  NA 139 137 136 133*  K 4.8 4.5 4.5 4.1  CL 108 105 103 101  CO2 17* 20* 20* 21*  GLUCOSE 106* 114* 90 132*  BUN 44* 54* 51* 38*  CREATININE 11.16*  10.78* 10.60* 8.58*  CALCIUM 9.4 8.7* 8.5* 8.6*  MG  --   --  1.7 1.7  PHOS  --   --  5.6* 2.7   Estimated Creatinine Clearance: 9.1 mL/min (A) (by C-G formula based on SCr of 8.58 mg/dL (H)). Liver & Pancreas: Recent Labs  Lab 03/29/20 0338 03/30/20 0145  ALBUMIN 3.3* 3.2*   No results for input(s): LIPASE, AMYLASE in the last 168 hours. No results for input(s): AMMONIA in the last 168 hours. Diabetic: No results for input(s): HGBA1C in the last 72 hours. No results for input(s): GLUCAP in the last 168 hours.  Cardiac Enzymes: No results for input(s): CKTOTAL, CKMB, CKMBINDEX, TROPONINI in the last 168 hours. No results for input(s): PROBNP in the last 8760 hours. Coagulation Profile: No results for input(s): INR, PROTIME in the last 168 hours. Thyroid Function Tests: No results for input(s): TSH, T4TOTAL, FREET4, T3FREE, THYROIDAB in the last 72 hours. Lipid Profile: No results for input(s): CHOL, HDL, LDLCALC, TRIG, CHOLHDL, LDLDIRECT in the last 72 hours. Anemia Panel: No results for input(s): VITAMINB12, FOLATE, FERRITIN, TIBC, IRON, RETICCTPCT in the last 72 hours. Urine analysis: No results found for: COLORURINE, APPEARANCEUR, LABSPEC, PHURINE, GLUCOSEU, HGBUR, BILIRUBINUR, KETONESUR, PROTEINUR, UROBILINOGEN, NITRITE, LEUKOCYTESUR Sepsis Labs: Invalid input(s): PROCALCITONIN, [place]  Microbiology: Recent Results (from the past 240 hour(s))  Resp Panel by RT-PCR (Flu A&B, Covid) Nasopharyngeal Swab     Status: None   Collection Time: 03/27/20  3:34 PM   Specimen: Nasopharyngeal Swab; Nasopharyngeal(NP) swabs in vial transport medium  Result Value Ref Range Status   SARS Coronavirus 2 by RT PCR NEGATIVE NEGATIVE Final    Comment: (NOTE) SARS-CoV-2 target nucleic acids are NOT DETECTED.  The SARS-CoV-2 RNA is generally detectable in upper respiratory specimens during the acute phase of infection. The lowest concentration of SARS-CoV-2 viral copies this assay can  detect is 138 copies/mL. A negative result does not preclude SARS-Cov-2 infection and should not be used as the sole basis for treatment or other patient management decisions. A negative result may occur with  improper specimen collection/handling, submission of specimen other than nasopharyngeal swab, presence of viral mutation(s) within the areas targeted by this assay, and inadequate number of viral copies(<138 copies/mL). A negative result must be combined with clinical observations, patient history, and epidemiological information. The expected result is Negative.  Fact Sheet for Patients:  EntrepreneurPulse.com.au  Fact Sheet for Healthcare Providers:  IncredibleEmployment.be  This test is no t yet approved or cleared by the [place] FDA and  has been authorized for detection and/or diagnosis of SARS-CoV-2 by FDA under an Emergency Use Authorization (EUA). This EUA will remain  in effect (meaning this test can be used) for the duration of the COVID-19 declaration under Section 564(b)(1) of the Act, 21 U.S.C.section 360bbb-3(b)(1), unless the authorization is terminated  or revoked sooner.       Influenza A by PCR NEGATIVE NEGATIVE Final   Influenza B by PCR NEGATIVE NEGATIVE Final    Comment: (NOTE) The Xpert Xpress SARS-CoV-2/FLU/RSV plus assay is intended as an aid in the diagnosis of influenza from Nasopharyngeal swab specimens and should not be used as a sole basis for treatment. Nasal washings and aspirates are unacceptable for Xpert Xpress SARS-CoV-2/FLU/RSV testing.  Fact Sheet for Patients: EntrepreneurPulse.com.au  Fact Sheet for Healthcare Providers: IncredibleEmployment.be  This test is not yet approved or cleared by the [place] FDA and has been authorized for detection and/or diagnosis of SARS-CoV-2 by FDA under an Emergency Use Authorization (EUA). This EUA will remain in  effect (meaning this test can be used) for the duration of the COVID-19 declaration under Section 564(b)(1) of the Act, 21 U.S.C. section 360bbb-3(b)(1), unless the authorization is terminated or revoked.  Performed at [facility], [place] [address], [place], [place] [address]     Radiology Studies: No results found.    Pascuala Klutts T. [place]  If 7PM-7AM, please contact night-coverage [email or web address] 04/01/2020, 1:31 PM

## 2020-04-01 NOTE — Progress Notes (Signed)
Renal Navigator spoke with patient regarding referral for outpatient HD. Patient reports no preference on clinic. Referral submitted to [facility] to request seat at [facility] clinic, as Navigator is aware that there was a seat available there recently and is close to patient's address. Patient agreeable. Navigator will monitor closely.   Alphonzo Cruise, [place] Renal Navigator [phone]

## 2020-04-01 NOTE — Progress Notes (Signed)
Patient ID: Stacie Greene, female   DOB: 1975/05/03, 44 y.o.   MRN: [id number]  [facility] Progress Note   Assessment/ Plan:   1.  Chest pain/non-ST elevation MI: Status post coronary angiography with drug-eluting stents to proximal and mid LAD on 12/10.  DAPT will be indicated for the next year.  She is on carvedilol and statin. 2. ESRD: With history of progressive underlying chronic kidney disease now progressing to end-stage renal disease and started on dialysis 2/10.  Will order for next hemodialysis treatment tomorrow.  The process has been initiated for outpatient dialysis unit placement to [facility]. 3. Anemia: Marginally depressed hemoglobin and hematocrit, begin ESA with hemodialysis tomorrow along with evaluation of iron stores. 4. CKD-MBD: Phosphorus level under control with acceptable calcium level.  On calcitriol for PTH suppression. 5. Nutrition: Continue protein/nutritional supplementation with renal diet and begin renal multivitamin. 6. Hypertension: Blood pressure under acceptable control, continue to follow with HD/ongoing medications.  Subjective:   Reports to be feeling fair and denies any chest pain or shortness of breath.   Objective:   BP 129/77 (BP Location: Right Arm)   Pulse 90   Temp 98.7 F (37.1 C) (Oral)   Resp 12   Ht 5\' 7"  (1.702 m)   Wt 77.6 kg   SpO2 100%   BMI 26.79 kg/m   Physical Exam: Gen: Comfortably sitting up in chair at bedside. CVS: Pulse regular rhythm, normal rate, S1 and S2 normal Resp: Clear to auscultation bilaterally, no distinct rales or rhonchi Abd: Soft, flat, nontender Ext: Trace lower extremity edema with left RCF.  Labs: BMET Recent Labs  Lab 03/27/20 1419 03/28/20 0424 03/29/20 0338 03/30/20 0145  NA 139 137 136 133*  K 4.8 4.5 4.5 4.1  CL 108 105 103 101  CO2 17* 20* 20* 21*  GLUCOSE 106* 114* 90 132*  BUN 44* 54* 51* 38*  CREATININE 11.16* 10.78* 10.60* 8.58*  CALCIUM 9.4  8.7* 8.5* 8.6*  PHOS  --   --  5.6* 2.7   CBC Recent Labs  Lab 03/27/20 1419 03/28/20 0424 03/29/20 0338 03/30/20 0145  WBC 14.3* 15.5* 13.4* 13.8*  NEUTROABS  --   --  8.5*  --   HGB 11.2* 9.8* 9.9* 9.4*  HCT 35.1* 31.4* 30.2* 29.3*  MCV 84.4 84.0 83.0 81.4  PLT 403* 339 311 308     Medications:    . aspirin EC  81 mg Oral Daily  . atorvastatin  40 mg Oral q1800  . calcitRIOL  0.25 mcg Oral Q M,W,F  . carvedilol  6.25 mg Oral BID WC  . Chlorhexidine Gluconate Cloth  6 each Topical Q0600  . cholecalciferol  400 Units Oral Daily  . sodium bicarbonate  650 mg Oral BID  . sodium chloride flush  3 mL Intravenous Q12H  . sodium chloride flush  3 mL Intravenous Q12H  . ticagrelor  90 mg Oral BID   Elmarie Shiley, MD 04/01/2020, 11:42 AM

## 2020-04-01 NOTE — Telephone Encounter (Signed)
TOC Patient-  Please call Patient-  Pt have an appt with Kerin Ransom on 04-10-20

## 2020-04-01 NOTE — TOC Benefit Eligibility Note (Signed)
Transition of Care [facility]) Benefit Eligibility Note    Patient Details  Name: Stacie Greene MRN: [id number] Date of Birth: 1976-01-24    Additional Notes: Based on the insurance information available, patient has medicaid and should have only a $3 dollar co-pay for medication at retail pharmacy. There is no insurance available to verify.    [place] Phone Number: 04/01/2020, 1:49 PM

## 2020-04-02 LAB — CBC
HCT: 26.8 % — ABNORMAL LOW (ref 36.0–46.0)
Hemoglobin: 9 g/dL — ABNORMAL LOW (ref 12.0–15.0)
MCH: 27.4 pg (ref 26.0–34.0)
MCHC: 33.6 g/dL (ref 30.0–36.0)
MCV: 81.7 fL (ref 80.0–100.0)
Platelets: 311 10*3/uL (ref 150–400)
RBC: 3.28 MIL/uL — ABNORMAL LOW (ref 3.87–5.11)
RDW: 14.6 % (ref 11.5–15.5)
WBC: 14 10*3/uL — ABNORMAL HIGH (ref 4.0–10.5)
nRBC: 0 % (ref 0.0–0.2)

## 2020-04-02 LAB — RENAL FUNCTION PANEL
Albumin: 3.2 g/dL — ABNORMAL LOW (ref 3.5–5.0)
Anion gap: 12 (ref 5–15)
BUN: 46 mg/dL — ABNORMAL HIGH (ref 6–20)
CO2: 22 mmol/L (ref 22–32)
Calcium: 8.7 mg/dL — ABNORMAL LOW (ref 8.9–10.3)
Chloride: 101 mmol/L (ref 98–111)
Creatinine, Ser: 10.15 mg/dL — ABNORMAL HIGH (ref 0.44–1.00)
GFR, Estimated: 4 mL/min — ABNORMAL LOW (ref >60)
Glucose, Bld: 106 mg/dL — ABNORMAL HIGH (ref 70–99)
Phosphorus: 5.7 mg/dL — ABNORMAL HIGH (ref 2.5–4.6)
Potassium: 4.2 mmol/L (ref 3.5–5.1)
Sodium: 135 mmol/L (ref 135–145)

## 2020-04-02 LAB — IRON AND TIBC
Iron: 33 ug/dL (ref 28–170)
Saturation Ratios: 10 % — ABNORMAL LOW (ref 10.4–31.8)
TIBC: 323 ug/dL (ref 250–450)
UIBC: 290 ug/dL

## 2020-04-02 LAB — FERRITIN: Ferritin: 71 ng/mL (ref 11–307)

## 2020-04-02 LAB — MAGNESIUM: Magnesium: 2 mg/dL (ref 1.7–2.4)

## 2020-04-02 MED ORDER — HEPARIN SODIUM (PORCINE) 1000 UNIT/ML DIALYSIS: 40.0000 [IU]/kg | INTRAMUSCULAR | Status: DC | PRN

## 2020-04-02 MED ORDER — DARBEPOETIN ALFA 60 MCG/0.3ML IJ SOSY
PREFILLED_SYRINGE | INTRAMUSCULAR | Status: AC
  Filled 2020-04-02: qty 0.3

## 2020-04-02 NOTE — Progress Notes (Signed)
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  Pain is worse with deep breathing and coughing.  Pain is not exertional.  Never had similar pain.  In ED, hemodynamically stable.  WBC 14.3.  Hgb 11.2.  Cr 11.16.  BUN 44.  Bicarb 17.  BNP 160.7.  Troponin 1300.  EKG without acute ischemic finding or normal intervals.  She was admitted with concern for pericarditis although etiology was not clear.  Nephrology and cardiology consulted.  TTE ordered.  Patient had no further cardiopulmonary symptoms but troponin peaked at 4690. TTE with LVEF of 40%, akinesis of distal inferior, anterior and septal walls, G1-DD.  Cardiology consulted  [place] revealed 99% and 70% stenosis in proximal and mid LAD respectively.  She had DES stent to proximal and mid LAD on 03/29/2020.  Started on Brilinta and aspirin.  Nephrology started hemodialysis. CLIP in process.  Subjective: Seen and examined earlier this morning.  No major events overnight of this morning.  No complaints.  Denies chest pain, dyspnea, cough, GI or UTI symptoms.  Reports making good amount of urine.  Denies hematuria.  LUE swelling improved.  Objective: Vitals:   04/02/20 1340 04/02/20 1345 04/02/20 1350 04/02/20 1355  BP:   (!) 131/108 124/87  Pulse:      Resp: 17 11 17 16   Temp:      TempSrc:      SpO2:      Weight:      Height:        Intake/Output Summary (Last 24 hours) at 04/02/2020 1404 Last data filed at 04/02/2020 0700 Gross per 24 hour  Intake 480 ml  Output 0 ml  Net 480 ml   Filed Weights   03/30/20 1644 04/02/20 0658 04/02/20 1301  Weight: 77.6  kg 75 kg 77.3 kg    Examination:  GENERAL: No apparent distress.  Nontoxic. HEENT: MMM.  Vision and hearing grossly intact.  NECK: Supple.  No apparent JVD.  RESP:  No IWOB.  Fair aeration bilaterally. CVS:  RRR. Heart sounds normal.  LUE aVF with good bruits. ABD/GI/GU: BS+. Abd soft, NTND.  MSK/EXT:  Moves extremities. No apparent deformity. No edema.  SKIN: no apparent skin lesion or wound NEURO: Awake, alert and oriented appropriately.  No apparent focal neuro deficit. PSYCH: Calm. Normal affect.  Procedures:  04/18/2020-LHC revealed 99% and 70% stenosis in proximal and mid LAD respectively.  She had DES stent to proximal and mid LAD  Microbiology summarized: COVID-19 and influenza PCR nonreactive.  Assessment & Plan: Non-STEMI/CAD: presented with atypical chest pain (sharp and at rest).  Troponin trended from 1300>> 4691.  No significant finding on EKG. TTE with LVEF of 40%, akinesis of distal inferior, anterior and septal walls, G1-DD. LHC revealed 99% and 70% stenosis in proximal and mid LAD respectively.  She had DES stent to proximal and mid LAD on 03/29/2020.  Remains chest pain-free. -Continue Coreg -DAPT with Brilinta and aspirin for 1 year -Cardiology to arrange outpatient follow-up.  Acute combined CHF/ICM:  Does not appear fluid overloaded.  TTE and LHC as above.  Appears euvolemic.  No cardiopulmonary symptoms now.  Reports making good amount of urine. -Fluid management with HD by nephrology  ESRD detail FSGS-she has had aVF. Plan was for PD and renal transplant down the road but started on HD this admission. Makes urine.  -HD per nephrology-had HD on 12/11. -CLIP in process.  Renal osteodystrophy/metabolic acidosis -Per nephrology.  Anemia of renal disease: Hgb relatively stable. -Continue monitoring  Essential hypertension: Normotensive. -Now on Coreg  Leukocytosis-unspecified.  No obvious source of infection. -Continue trending  Body mass index is  26.69 kg/m.         DVT prophylaxis:  SCD's Start: 03/29/20 1948 SCDs Start: 03/27/20 1851  Code Status: Full code Family Communication: Patient and/or RN. Available if any question.  Status is: Inpatient  Remains inpatient appropriate because:Unsafe d/c plan and Inpatient level of care appropriate due to severity of illness   Dispo: The patient is from: Home              Anticipated d/c is to: Home              Anticipated d/c date is: 1 day. Needs CLIP for HD              Patient currently is not medically stable to d/c.          Consultants:  Nephrology Cardiology-signed off   Sch Meds:  Scheduled Meds:  aspirin EC  81 mg Oral Daily   atorvastatin  40 mg Oral q1800   calcitRIOL  0.25 mcg Oral Q M,W,F   carvedilol  6.25 mg Oral BID WC   Chlorhexidine Gluconate Cloth  6 each Topical Q0600   cholecalciferol  400 Units Oral Daily   darbepoetin (ARANESP) injection - DIALYSIS  60 mcg Intravenous Q Tue-HD   influenza vac split quadrivalent PF  0.5 mL Intramuscular Tomorrow-1000   pneumococcal 13-valent conjugate vaccine  0.5 mL Intramuscular Tomorrow-1000   sodium bicarbonate  650 mg Oral BID   sodium chloride flush  3 mL Intravenous Q12H   sodium chloride flush  3 mL Intravenous Q12H   ticagrelor  90 mg Oral BID   Continuous Infusions:  sodium chloride     PRN Meds:.sodium chloride, acetaminophen **OR** acetaminophen, ondansetron **OR** ondansetron (ZOFRAN) IV, sodium chloride flush  Antimicrobials: Anti-infectives (From admission, onward)   None       I have personally reviewed the following labs and images: CBC: Recent Labs  Lab 03/27/20 1419 03/28/20 0424 03/29/20 0338 03/30/20 0145 04/02/20 0258  WBC 14.3* 15.5* 13.4* 13.8* 14.0*  NEUTROABS  --   --  8.5*  --   --   HGB 11.2* 9.8* 9.9* 9.4* 9.0*  HCT 35.1* 31.4* 30.2* 29.3* 26.8*  MCV 84.4 84.0 83.0 81.4 81.7  PLT 403* 339 311 308 311   BMP &GFR Recent Labs  Lab  03/27/20 1419 03/28/20 0424 03/29/20 0338 03/30/20 0145 04/02/20 0258  NA 139 137 136 133* 135  K 4.8 4.5 4.5 4.1 4.2  CL 108 105 103 101 101  CO2 17* 20* 20* 21* 22  GLUCOSE 106* 114* 90 132* 106*  BUN 44* 54* 51* 38* 46*  CREATININE 11.16* 10.78* 10.60* 8.58* 10.15*  CALCIUM 9.4 8.7* 8.5* 8.6* 8.7*  MG  --   --  1.7 1.7 2.0  PHOS  --   --  5.6* 2.7 5.7*   Estimated Creatinine Clearance: 7.7 mL/min (A) (by C-G formula based on SCr of 10.15 mg/dL (H)). Liver & Pancreas: Recent Labs  Lab  03/29/20 0338 03/30/20 0145 04/02/20 0258  ALBUMIN 3.3* 3.2* 3.2*   No results for input(s): LIPASE, AMYLASE in the last 168 hours. No results for input(s): AMMONIA in the last 168 hours. Diabetic: No results for input(s): HGBA1C in the last 72 hours. No results for input(s): GLUCAP in the last 168 hours. Cardiac Enzymes: No results for input(s): CKTOTAL, CKMB, CKMBINDEX, TROPONINI in the last 168 hours. No results for input(s): PROBNP in the last 8760 hours. Coagulation Profile: No results for input(s): INR, PROTIME in the last 168 hours. Thyroid Function Tests: No results for input(s): TSH, T4TOTAL, FREET4, T3FREE, THYROIDAB in the last 72 hours. Lipid Profile: No results for input(s): CHOL, HDL, LDLCALC, TRIG, CHOLHDL, LDLDIRECT in the last 72 hours. Anemia Panel: No results for input(s): VITAMINB12, FOLATE, FERRITIN, TIBC, IRON, RETICCTPCT in the last 72 hours. Urine analysis: No results found for: COLORURINE, APPEARANCEUR, LABSPEC, PHURINE, GLUCOSEU, HGBUR, BILIRUBINUR, KETONESUR, PROTEINUR, UROBILINOGEN, NITRITE, LEUKOCYTESUR Sepsis Labs: Invalid input(s): PROCALCITONIN, [place]  Microbiology: Recent Results (from the past 240 hour(s))  Resp Panel by RT-PCR (Flu A&B, Covid) Nasopharyngeal Swab     Status: None   Collection Time: 03/27/20  3:34 PM   Specimen: Nasopharyngeal Swab; Nasopharyngeal(NP) swabs in vial transport medium  Result Value Ref Range Status   SARS  Coronavirus 2 by RT PCR NEGATIVE NEGATIVE Final    Comment: (NOTE) SARS-CoV-2 target nucleic acids are NOT DETECTED.  The SARS-CoV-2 RNA is generally detectable in upper respiratory specimens during the acute phase of infection. The lowest concentration of SARS-CoV-2 viral copies this assay can detect is 138 copies/mL. A negative result does not preclude SARS-Cov-2 infection and should not be used as the sole basis for treatment or other patient management decisions. A negative result may occur with  improper specimen collection/handling, submission of specimen other than nasopharyngeal swab, presence of viral mutation(s) within the areas targeted by this assay, and inadequate number of viral copies(<138 copies/mL). A negative result must be combined with clinical observations, patient history, and epidemiological information. The expected result is Negative.  Fact Sheet for Patients:  EntrepreneurPulse.com.au  Fact Sheet for Healthcare Providers:  IncredibleEmployment.be  This test is no t yet approved or cleared by the [place] FDA and  has been authorized for detection and/or diagnosis of SARS-CoV-2 by FDA under an Emergency Use Authorization (EUA). This EUA will remain  in effect (meaning this test can be used) for the duration of the COVID-19 declaration under Section 564(b)(1) of the Act, 21 U.S.C.section 360bbb-3(b)(1), unless the authorization is terminated  or revoked sooner.       Influenza A by PCR NEGATIVE NEGATIVE Final   Influenza B by PCR NEGATIVE NEGATIVE Final    Comment: (NOTE) The Xpert Xpress SARS-CoV-2/FLU/RSV plus assay is intended as an aid in the diagnosis of influenza from Nasopharyngeal swab specimens and should not be used as a sole basis for treatment. Nasal washings and aspirates are unacceptable for Xpert Xpress SARS-CoV-2/FLU/RSV testing.  Fact Sheet for  Patients: EntrepreneurPulse.com.au  Fact Sheet for Healthcare Providers: IncredibleEmployment.be  This test is not yet approved or cleared by the [place] FDA and has been authorized for detection and/or diagnosis of SARS-CoV-2 by FDA under an Emergency Use Authorization (EUA). This EUA will remain in effect (meaning this test can be used) for the duration of the COVID-19 declaration under Section 564(b)(1) of the Act, 21 U.S.C. section 360bbb-3(b)(1), unless the authorization is terminated or revoked.  Performed at [facility], [place] [address],  [place], [place] [address]     Radiology Studies: No results found.    Mary-Ann Pennella T. [place]  If 7PM-7AM, please contact night-coverage [email or web address] 04/02/2020, 2:04 PM

## 2020-04-02 NOTE — Progress Notes (Signed)
Patient ID: Stacie Greene, female   DOB: 1975/10/23, 44 y.o.   MRN: [id number]  [facility] Progress Note   Assessment/ Plan:   1.  Chest pain/non-ST elevation MI: Status post coronary angiography with drug-eluting stents to proximal and mid LAD on 12/10.  DAPT will be indicated for the next year.  She is on carvedilol and statin. 2. ESRD: With history of progressive underlying chronic kidney disease now progressing to end-stage renal disease and started on dialysis 2/10.  On schedule for hemodialysis today to continue TTS dialysis schedule while here in the hospital.  Process initiated yesterday for outpatient dialysis unit placement likely [facility]. 3. Anemia: Marginally depressed hemoglobin and hematocrit, labs ordered to check iron studies today along with administration of ESA. 4. CKD-MBD: Phosphorus level under control with acceptable calcium level.  On calcitriol for PTH suppression. 5. Nutrition: Continue protein/nutritional supplementation with renal diet and begin renal multivitamin. 6. Hypertension: Blood pressure under acceptable control, continue to follow with HD/ongoing medications.  Subjective:   Denies any chest pain or shortness of breath, continues to feel better.   Objective:   BP 133/79 (BP Location: Right Arm)   Pulse 100   Temp 98.2 F (36.8 C) (Oral)   Resp 17   Ht 5\' 7"  (1.702 m)   Wt 75 kg   SpO2 96%   BMI 25.89 kg/m   Physical Exam: Gen: Comfortably resting in bed, watching television CVS: Pulse regular rhythm, normal rate, S1 and S2 normal Resp: Clear to auscultation bilaterally, no distinct rales or rhonchi Abd: Soft, flat, nontender Ext: Trace lower extremity edema with left RCF that has a palpable thrill.  Labs: BMET Recent Labs  Lab 03/27/20 1419 03/28/20 0424 03/29/20 0338 03/30/20 0145 04/02/20 0258  NA 139 137 136 133* 135  K 4.8 4.5 4.5 4.1 4.2  CL 108 105 103 101 101  CO2 17* 20* 20* 21* 22  GLUCOSE 106* 114* 90 132* 106*   BUN 44* 54* 51* 38* 46*  CREATININE 11.16* 10.78* 10.60* 8.58* 10.15*  CALCIUM 9.4 8.7* 8.5* 8.6* 8.7*  PHOS  --   --  5.6* 2.7 5.7*   CBC Recent Labs  Lab 03/28/20 0424 03/29/20 0338 03/30/20 0145 04/02/20 0258  WBC 15.5* 13.4* 13.8* 14.0*  NEUTROABS  --  8.5*  --   --   HGB 9.8* 9.9* 9.4* 9.0*  HCT 31.4* 30.2* 29.3* 26.8*  MCV 84.0 83.0 81.4 81.7  PLT 339 311 308 311     Medications:    . aspirin EC  81 mg Oral Daily  . atorvastatin  40 mg Oral q1800  . calcitRIOL  0.25 mcg Oral Q M,W,F  . carvedilol  6.25 mg Oral BID WC  . Chlorhexidine Gluconate Cloth  6 each Topical Q0600  . cholecalciferol  400 Units Oral Daily  . darbepoetin (ARANESP) injection - DIALYSIS  60 mcg Intravenous Q Tue-HD  . influenza vac split quadrivalent PF  0.5 mL Intramuscular Tomorrow-1000  . pneumococcal 13-valent conjugate vaccine  0.5 mL Intramuscular Tomorrow-1000  . sodium bicarbonate  650 mg Oral BID  . sodium chloride flush  3 mL Intravenous Q12H  . sodium chloride flush  3 mL Intravenous Q12H  . ticagrelor  90 mg Oral BID   Elmarie Shiley, MD 04/02/2020, 10:40 AM

## 2020-04-03 ENCOUNTER — Other Ambulatory Visit (HOSPITAL_COMMUNITY): Payer: Self-pay | Admitting: Internal Medicine

## 2020-04-03 MED ORDER — TICAGRELOR 90 MG PO TABS: 90.0000 mg | ORAL_TABLET | Freq: Two times a day (BID) | ORAL | 1 refills | Status: DC

## 2020-04-03 MED ORDER — ASPIRIN 81 MG PO TBEC: 81.0000 mg | DELAYED_RELEASE_TABLET | Freq: Every day | ORAL | 11 refills | Status: DC

## 2020-04-03 MED ORDER — ATORVASTATIN CALCIUM 40 MG PO TABS: 40.0000 mg | ORAL_TABLET | Freq: Every day | ORAL | 1 refills | Status: DC

## 2020-04-03 MED ORDER — CARVEDILOL 6.25 MG PO TABS: 6.2500 mg | ORAL_TABLET | Freq: Two times a day (BID) | ORAL | 1 refills | Status: DC

## 2020-04-03 MED FILL — BRILINTA 90 MG TABLET: 90 | 30 days supply | Qty: 60 | Fill #0

## 2020-04-03 MED FILL — ASPIRIN LOW DOSE 81 MG TBEC: 81 | 30 days supply | Qty: 30 | Fill #0

## 2020-04-03 MED FILL — CARVEDILOL 6.25 MG TABLET: 6.25 | 30 days supply | Qty: 60 | Fill #0

## 2020-04-03 MED FILL — ATORVASTATIN CALCIUM 40 MG: 40 | 30 days supply | Qty: 30 | Fill #0

## 2020-04-03 NOTE — Plan of Care (Signed)
  Problem: Health Behavior/Discharge Planning: Goal: Ability to manage health-related needs will improve Outcome: Progressing   Problem: Clinical Measurements: Goal: Ability to maintain clinical measurements within normal limits will improve Outcome: Progressing Goal: Diagnostic test results will improve Outcome: Progressing Goal: Cardiovascular complication will be avoided Outcome: Progressing   Problem: Activity: Goal: Risk for activity intolerance will decrease Outcome: Progressing   Problem: Pain Managment: Goal: General experience of comfort will improve Outcome: Progressing   Problem: Education: Goal: Understanding of CV disease, CV risk reduction, and recovery process will improve Outcome: Progressing Goal: Individualized Educational Video(s) Outcome: Progressing

## 2020-04-03 NOTE — Progress Notes (Signed)
Patient ID: Stacie Greene, female   DOB: [birth year], 44 y.o.   MRN: [id number]  [facility] Progress Note   Assessment/ Plan:   1.  Chest pain/non-ST elevation MI: Status post coronary angiography with drug-eluting stents to proximal and mid LAD on 12/10.  DAPT will be indicated for the next year.  She is on carvedilol and statin. 2. ESRD: With history of progressive underlying chronic kidney disease now progressing to end-stage renal disease and started on dialysis 2/10.  She was dialyzed yesterday on a TTS schedule while here in the hospital and is clinically stable to discharge home today and follow-up at the outpatient dialysis unit on Friday ([place]) at 11 AM. 3. Anemia: Marginally depressed hemoglobin and hematocrit, continue ESA with hemodialysis and start intravenous iron series (iron saturation 10%, ferritin 71). 4. CKD-MBD: Phosphorus level under control with acceptable calcium level.  On calcitriol for PTH suppression. 5. Nutrition: Continue protein/nutritional supplementation with renal diet and begin renal multivitamin. 6. Hypertension: Blood pressure under acceptable control, continue to follow with HD/ongoing medications.  Subjective:   Denies any complaints overnight and tolerated dialysis without problems   Objective:   BP 114/83   Pulse 99   Temp 99.3 F (37.4 C) (Oral)   Resp 18   Ht 5\' 7"  (1.702 m)   Wt 74.9 kg   SpO2 100%   BMI 25.87 kg/m   Physical Exam: Gen: Comfortably being in bed, easy to awaken and engage in conversation CVS: Pulse regular rhythm, normal rate, S1 and S2 normal Resp: Clear to auscultation bilaterally, no distinct rales or rhonchi Abd: Soft, flat, nontender Ext: Trace lower extremity edema with left RCF that has a palpable thrill.  Labs: BMET Recent Labs  Lab 03/27/20 1419 03/28/20 0424 03/29/20 0338 03/30/20 0145 04/02/20 0258  NA 139 137 136 133* 135  K 4.8 4.5 4.5 4.1 4.2  CL 108 105  103 101 101  CO2 17* 20* 20* 21* 22  GLUCOSE 106* 114* 90 132* 106*  BUN 44* 54* 51* 38* 46*  CREATININE 11.16* 10.78* 10.60* 8.58* 10.15*  CALCIUM 9.4 8.7* 8.5* 8.6* 8.7*  PHOS  --   --  5.6* 2.7 5.7*   CBC Recent Labs  Lab 03/28/20 0424 03/29/20 0338 03/30/20 0145 04/02/20 0258  WBC 15.5* 13.4* 13.8* 14.0*  NEUTROABS  --  8.5*  --   --   HGB 9.8* 9.9* 9.4* 9.0*  HCT 31.4* 30.2* 29.3* 26.8*  MCV 84.0 83.0 81.4 81.7  PLT 339 311 308 311     Medications:    . aspirin EC  81 mg Oral Daily  . atorvastatin  40 mg Oral q1800  . calcitRIOL  0.25 mcg Oral Q M,W,F  . carvedilol  6.25 mg Oral BID WC  . cholecalciferol  400 Units Oral Daily  . darbepoetin (ARANESP) injection - DIALYSIS  60 mcg Intravenous Q Tue-HD  . influenza vac split quadrivalent PF  0.5 mL Intramuscular Tomorrow-1000  . pneumococcal 13-valent conjugate vaccine  0.5 mL Intramuscular Tomorrow-1000  . sodium bicarbonate  650 mg Oral BID  . sodium chloride flush  3 mL Intravenous Q12H  . sodium chloride flush  3 mL Intravenous Q12H  . ticagrelor  90 mg Oral BID   Elmarie Shiley, MD 04/03/2020, 11:04 AM

## 2020-04-03 NOTE — Care Management (Signed)
[facility] pay card provided to patient. Graves-Bigelow, Ocie Cornfield, RN, BSN Case Manager

## 2020-04-03 NOTE — Telephone Encounter (Signed)
Currently admitted.

## 2020-04-03 NOTE — Discharge Instructions (Signed)
Femoral Site Care This sheet gives you information about how to care for yourself after your procedure. Your health care provider may also give you more specific instructions. If you have problems or questions, contact your health care provider. What can I expect after the procedure? After the procedure, it is common to have:  Bruising that usually fades within 1-2 weeks.  Tenderness at the site. Follow these instructions at home: Wound care  Follow instructions from your health care provider about how to take care of your insertion site. Make sure you: ? Wash your hands with soap and water before you change your bandage (dressing). If soap and water are not available, use hand sanitizer. ? Change your dressing as told by your health care provider. ? Leave stitches (sutures), skin glue, or adhesive strips in place. These skin closures may need to stay in place for 2 weeks or longer. If adhesive strip edges start to loosen and curl up, you may trim the loose edges. Do not remove adhesive strips completely unless your health care provider tells you to do that.  Do not take baths, swim, or use a hot tub until your health care provider approves.  You may shower 24-48 hours after the procedure or as told by your health care provider. ? Gently wash the site with plain soap and water. ? Pat the area dry with a clean towel. ? Do not rub the site. This may cause bleeding.  Do not apply powder or lotion to the site. Keep the site clean and dry.  Check your femoral site every day for signs of infection. Check for: ? Redness, swelling, or pain. ? Fluid or blood. ? Warmth. ? Pus or a bad smell. Activity  For the first 2-3 days after your procedure, or as long as directed: ? Avoid climbing stairs as much as possible. ? Do not squat.  Do not lift anything that is heavier than 10 lb (4.5 kg), or the limit that you are told, until your health care provider says that it is safe.  Rest as  directed. ? Avoid sitting for a long time without moving. Get up to take short walks every 1-2 hours.  Do not drive for 24 hours if you were given a medicine to help you relax (sedative). General instructions  Take over-the-counter and prescription medicines only as told by your health care provider.  Keep all follow-up visits as told by your health care provider. This is important. Contact a health care provider if you have:  A fever or chills.  You have redness, swelling, or pain around your insertion site. Get help right away if:  The catheter insertion area swells very fast.  You pass out.  You suddenly start to sweat or your skin gets clammy.  The catheter insertion area is bleeding, and the bleeding does not stop when you hold steady pressure on the area.  The area near or just beyond the catheter insertion site becomes pale, cool, tingly, or numb. These symptoms may represent a serious problem that is an emergency. Do not wait to see if the symptoms will go away. Get medical help right away. Call your local emergency services (911 in the [place] Do not drive yourself to the hospital. Summary  After the procedure, it is common to have bruising that usually fades within 1-2 weeks.  Check your femoral site every day for signs of infection.  Do not lift anything that is heavier than 10 lb (4.5 kg), or the   limit that you are told, until your health care provider says that it is safe. This information is not intended to replace advice given to you by your health care provider. Make sure you discuss any questions you have with your health care provider. Document Revised: 04/19/2017 Document Reviewed: 04/19/2017 Elsevier Patient Education  2020 [place] [facility] Dialysis is a treatment that cleans your blood. It is used when your kidneys are damaged. When you need dialysis, you should watch what you eat. This is because some nutrients can build up in  your blood between treatments and make you sick. Your doctor or diet specialist (dietitian) will:  Tell you what nutrients you should include or avoid.  Tell you how much of these nutrients you should get each day.  Help you plan meals.  Tell you how much to drink each day. What are tips for following this plan? Reading food labels  Check food labels for: ? Potassium. This is found in milk, fruits, and vegetables. ? Phosphorus. This is found in milk, cheese, beans, nuts, and carbonated beverages. ? Salt (sodium). This is in processed meats, cured meats, ready-made frozen meals, canned vegetables, and salty snack foods.  Try to find foods that are low in potassium, phosphorus, and sodium.  Look for foods that are labeled "sodium free," "reduced sodium," or "low sodium." Shopping  Do not buy whole-grain and high-fiber foods.  Do not buy or use salt substitutes.  Do not buy processed foods. Cooking  Drain all fluid from cooked vegetables and canned fruits before you eat them.  Before you cook potatoes, cut them into small pieces. Then boil them in unsalted water.  Try using herbs and spices that do not contain sodium to add flavor. Meal planning Most people on dialysis should try to eat:  6-11 servings of grains each day. One serving is equal to 1 slice of bread or  cup of cooked rice or pasta.  2-3 servings of low-potassium vegetables each day. One serving is equal to  cup.  2-3 servings of low-potassium fruits each day. One serving is equal to  cup.  Protein, such as meat, poultry, fish, and eggs. Talk with your doctor or dietitian about the right amount and type of protein to eat.   cup of dairy each day. General information  Follow your doctor's instructions about how much to drink. You may be told to: ? Write down what you drink. ? Write down the foods you eat that are made mostly from water, such as gelatin and soups. ? Drink from small cups.  Take vitamin  and mineral supplements only as told by your doctor.  Take over-the-counter and prescription medicines only as told by your doctor. What foods can I eat?     Fruits Apples. Fresh or frozen berries. Fresh or canned pears, peaches, and pineapple. Grapes. Plums. Vegetables Fresh or frozen broccoli, carrots, and green beans. Cabbage. Cauliflower. Celery. Cucumbers. Eggplant. Radishes. Zucchini. Grains White bread. White rice. Cooked cereal. Unsalted popcorn. Tortillas. Pasta. Meats and other proteins Fresh or frozen beef, pork, chicken, and fish. Eggs. Dairy Cream cheese. Heavy cream. Ricotta cheese. Beverages Apple cider. Cranberry juice. Grape juice. Lemonade. Black coffee. Rice milk (that is not enriched or fortified). Seasonings and condiments Herbs. Spices. Jam and jelly. Honey. Sweets and desserts Sherbet. Cakes. Cookies. Fats and oils Olive oil, canola oil, and safflower oil. Other foods Non-dairy creamer. Non-dairy whipped topping. Homemade broth without salt. The items listed above  may not be a complete list of foods and beverages you can eat. Contact your dietitian for more options. What foods should I avoid? Fruits Star fruit. Bananas. Oranges. Kiwi. Nectarines. Prunes. Melon. Dried fruit. Avocado. Vegetables Potatoes. Beets. Tomatoes. Winter squash and pumpkin. Asparagus. Spinach. Parsnips. Grains Whole-grain bread. Whole-grain pasta. High-fiber cereal. Meats and other proteins Canned, smoked, and cured meats. Packaged lunch meat. Sardines. Nuts and seeds. Peanut butter. Beans and legumes. Dairy Milk. Buttermilk. Yogurt. Cheese and cottage cheese. Processed cheese spreads. Beverages Orange juice. Prune juice. Carbonated soft drinks. Seasonings and condiments Salt. Salt substitutes. Soy sauce. Sweets and desserts Ice cream. Chocolate. Candied nuts. Fats and oils Butter. Margarine. Other foods Ready-made frozen meals. Canned soups. The items listed above may not  be a complete list of foods and beverages you should avoid. Contact your dietitian for more information. Summary  If you are having dialysis, it is important to watch what you eat. Certain nutrients and wastes can build up in your blood and cause you to get sick.  Your dietitian will help you make an eating plan that meets your needs.  Avoid foods that are high in potassium, salt (sodium), and phosphorus. Restrict fluids as told by your doctor or dietitian. This information is not intended to replace advice given to you by your health care provider. Make sure you discuss any questions you have with your health care provider. Document Revised: 06/23/2017 Document Reviewed: 04/07/2017 Elsevier Patient Education  [place].

## 2020-04-03 NOTE — Discharge Summary (Addendum)
[facility]  Physician Discharge Summary   Patient ID: Stacie Greene MRN: [id number] DOB/AGE: 11-26-1975 44 y.o.  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 Patient established with outpatient dialysis unit. 2. 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  Pain is worse with deep breathing and coughing.  Pain is not exertional.  Never had similar pain.  In ED, hemodynamically stable.  WBC 14.3.  Hgb 11.2.  Cr 11.16.  BUN 44.  Bicarb 17.  BNP 160.7.  Troponin 1300.  EKG without acute ischemic finding or normal intervals.  She was admitted with concern for pericarditis although etiology was not clear.  Nephrology and cardiology consulted.  TTE ordered.  Patient had no further cardiopulmonary symptoms but troponin peaked at 4690. TTE with LVEF of 40%, akinesis of distal inferior, anterior and septal walls, G1-DD.  Cardiology consulted  [place] revealed 99% and 70% stenosis in proximal and mid LAD respectively.  She had DES stent to proximal and mid LAD on 03/29/2020.  Started on Brilinta and aspirin.   Consultations:  Cardiology  Nephrology  Procedures: 04/18/2020-LHC revealed 99% and 70% stenosis in proximal and mid LAD respectively.  She had DES stent to proximal and mid LAD   HOSPITAL COURSE:    Non-STEMI/CAD Presented with atypical chest pain (sharp and at rest).  Troponin trended from 1300>> 4691.  No significant finding on EKG. TTE with LVEF of 40%, akinesis of distal inferior, anterior and septal walls, G1-DD. LHC revealed 99% and 70% stenosis in proximal and mid LAD respectively.  She had DES stent to proximal and mid LAD on 03/29/2020.  Patient to be discharged on aspirin Brilinta carvedilol and statin.  Acute combined CHF/ICM Noted to have a EF of 40%.  Appears to be euvolemic.  Volume being managed with dialysis.    ESRD, new Nephrology has been following.  Patient has a fistula in her left arm.  Currently on dialysis.  Looks like they are planning a MWF schedule.    Patient has been established at outpatient dialysis center. Cleared by nephrology for discharge.  Renal osteodystrophy/metabolic acidosis  Anemia of renal disease Hemoglobin is stable.   Essential hypertension   Leukocytosis Unspecified.  No obvious source of infection.   Overall stable.  Patient has been waiting to be established at outpatient dialysis center.  Informed by the nephrology navigator that this has been arranged.  Remains medically stable for discharge.  She has appointment with cardiology next week.  Okay for discharge today.     PERTINENT LABS:  The results of significant diagnostics from this hospitalization (including imaging, microbiology, ancillary and laboratory) are listed below for reference.    Microbiology: Recent Results (from the past 240 hour(s))  Resp Panel by RT-PCR (Flu A&B, Covid) Nasopharyngeal Swab     Status: None   Collection Time: 03/27/20  3:34 PM   Specimen: Nasopharyngeal Swab; Nasopharyngeal(NP) swabs in vial transport  medium  Result Value Ref Range Status   SARS Coronavirus 2 by RT PCR NEGATIVE NEGATIVE Final    Comment: (NOTE) SARS-CoV-2 target nucleic acids are NOT DETECTED.  The SARS-CoV-2 RNA is generally detectable in upper  respiratory specimens during the acute phase of infection. The lowest concentration of SARS-CoV-2 viral copies this assay can detect is 138 copies/mL. A negative result does not preclude SARS-Cov-2 infection and should not be used as the sole basis for treatment or other patient management decisions. A negative result may occur with  improper specimen collection/handling, submission of specimen other than nasopharyngeal swab, presence of viral mutation(s) within the areas targeted by this assay, and inadequate number of viral copies(<138 copies/mL). A negative result must be combined with clinical observations, patient history, and epidemiological information. The expected result is Negative.  Fact Sheet for Patients:  EntrepreneurPulse.com.au  Fact Sheet for Healthcare Providers:  IncredibleEmployment.be  This test is no t yet approved or cleared by the [place] FDA and  has been authorized for detection and/or diagnosis of SARS-CoV-2 by FDA under an Emergency Use Authorization (EUA). This EUA will remain  in effect (meaning this test can be used) for the duration of the COVID-19 declaration under Section 564(b)(1) of the Act, 21 U.S.C.section 360bbb-3(b)(1), unless the authorization is terminated  or revoked sooner.       Influenza A by PCR NEGATIVE NEGATIVE Final   Influenza B by PCR NEGATIVE NEGATIVE Final    Comment: (NOTE) The Xpert Xpress SARS-CoV-2/FLU/RSV plus assay is intended as an aid in the diagnosis of influenza from Nasopharyngeal swab specimens and should not be used as a sole basis for treatment. Nasal washings and aspirates are unacceptable for Xpert Xpress SARS-CoV-2/FLU/RSV testing.  Fact Sheet for Patients: EntrepreneurPulse.com.au  Fact Sheet for Healthcare Providers: IncredibleEmployment.be  This test is not yet approved or cleared by the [place] FDA and has been  authorized for detection and/or diagnosis of SARS-CoV-2 by FDA under an Emergency Use Authorization (EUA). This EUA will remain in effect (meaning this test can be used) for the duration of the COVID-19 declaration under Section 564(b)(1) of the Act, 21 U.S.C. section 360bbb-3(b)(1), unless the authorization is terminated or revoked.  Performed at [facility], [place] [address], [place], [place] [address]      Labs:    Basic Metabolic Panel: Recent Labs  Lab 03/27/20 1419 03/28/20 0424 03/29/20 0338 03/30/20 0145 04/02/20 0258  NA 139 137 136 133* 135  K 4.8 4.5 4.5 4.1 4.2  CL 108 105 103 101 101  CO2 17* 20* 20* 21* 22  GLUCOSE 106* 114* 90 132* 106*  BUN 44* 54* 51* 38* 46*  CREATININE 11.16* 10.78* 10.60* 8.58* 10.15*  CALCIUM 9.4 8.7* 8.5* 8.6* 8.7*  MG  --   --  1.7 1.7 2.0  PHOS  --   --  5.6* 2.7 5.7*   Liver Function Tests: Recent Labs  Lab 03/29/20 0338 03/30/20 0145 04/02/20 0258  ALBUMIN 3.3* 3.2* 3.2*   CBC: Recent Labs  Lab 03/27/20 1419 03/28/20 0424 03/29/20 0338 03/30/20 0145 04/02/20 0258  WBC 14.3* 15.5* 13.4* 13.8* 14.0*  NEUTROABS  --   --  8.5*  --   --   HGB 11.2* 9.8* 9.9* 9.4* 9.0*  HCT 35.1* 31.4* 30.2* 29.3* 26.8*  MCV 84.4 84.0 83.0 81.4 81.7  PLT 403* 339 311 308 311   BNP: BNP (last 3 results) Recent Labs    03/27/20 1419  BNP 160.7*  IMAGING STUDIES DG Chest 2 View  Result Date: 03/27/2020 CLINICAL DATA:  Intermittent chest pain.  History of hypertension. EXAM: CHEST - 2 VIEW COMPARISON:  None. FINDINGS: Heart and mediastinal shadows are normal. The lungs are clear. The vascularity is normal. No effusions. Surgical clips in the low right neck probably related to thyroid surgery. Mild thoracolumbar scoliotic curvature. IMPRESSION: No active cardiopulmonary disease. Mild thoracolumbar scoliotic curvature. Electronically Signed   By: Nelson Chimes M.D.   On: 03/27/2020 15:26   CARDIAC CATHETERIZATION  Result  Date: 04/01/2020  Mid Cx to Dist Cx lesion is 20% stenosed.  Prox LAD lesion is 99% stenosed.  Mid LAD lesion is 70% stenosed.  Dist LAD lesion is 40% stenosed.  A drug-eluting stent was successfully placed using a SYNERGY XD 3.0X20.  Post intervention, there is a 0% residual stenosis.  A drug-eluting stent was successfully placed using a SYNERGY XD 3.0X20.  Post intervention, there is a 0% residual stenosis.  1. Severe stenosis proximal LAD and mid LAD. 2. Successful PTCA/DES x 2 proximal to mid LAD (overlapping stents). IVUS optimization used post stent deployment. 3. Mild non-obstructive disease in the Circumflex and distal LAD Recommendations: DAPT with ASA and Brilinta for one year. Continue statin and beta blocker.   ECHOCARDIOGRAM COMPLETE  Result Date: 03/28/2020    ECHOCARDIOGRAM REPORT   Patient Name:   EMERI ESTILL Date of Exam: 03/28/2020 Medical Rec #:  [id number]     Height:       67.0 in Accession #:    [id number]    Weight:       163.4 lb Date of Birth:  [birth year]     BSA:          1.856 m Patient Age:    45 years      BP:           117/74 mmHg Patient Gender: F             HR:           84 bpm. Exam Location:  Inpatient Procedure: 2D Echo Indications:    Chest Pain  History:        Patient has no prior history of Echocardiogram examinations.                 Risk Factors:Hypertension.  Sonographer:    Mikki Santee RDCS (AE) Referring Phys: [id number] [place]  1. LVEF is approximately 40% with akinesis of the distal inferior, distal anterior, mid/distal septal walls.. The left ventricle has moderately decreased function. description). There is mild left ventricular hypertrophy. Left ventricular diastolic parameters are consistent with Grade I diastolic dysfunction (impaired relaxation).  2. Right ventricular systolic function is normal. The right ventricular size is normal.  3. The mitral valve is normal in structure. Trivial mitral valve regurgitation.  4.  The aortic valve is normal in structure. Aortic valve regurgitation is not visualized.  5. The inferior vena cava is normal in size with greater than 50% respiratory variability, suggesting right atrial pressure of 3 mmHg. FINDINGS  Left Ventricle: LVEF is approximately 40% with akinesis of the distal inferior, distal anterior, mid/distal septal walls. The left ventricle has moderately decreased function. The left ventricle demonstrates regional wall motion abnormalities. The left ventricular internal cavity size was normal in size. There is mild left ventricular hypertrophy. Left ventricular diastolic parameters are consistent with Grade I diastolic dysfunction (impaired relaxation). Right Ventricle: The right ventricular size is normal. Right vetricular wall thickness  was not assessed. Right ventricular systolic function is normal. Left Atrium: Left atrial size was normal in size. Right Atrium: Right atrial size was normal in size. Pericardium: There is no evidence of pericardial effusion. Mitral Valve: The mitral valve is normal in structure. Trivial mitral valve regurgitation. Tricuspid Valve: The tricuspid valve is normal in structure. Tricuspid valve regurgitation is trivial. Aortic Valve: The aortic valve is normal in structure. Aortic valve regurgitation is not visualized. Pulmonic Valve: The pulmonic valve was normal in structure. Pulmonic valve regurgitation is not visualized. Aorta: The aortic root is normal in size and structure. Venous: The inferior vena cava is normal in size with greater than 50% respiratory variability, suggesting right atrial pressure of 3 mmHg. IAS/Shunts: No atrial level shunt detected by color flow Doppler.  LEFT VENTRICLE PLAX 2D LVIDd:         4.20 cm  Diastology LVIDs:         2.70 cm  LV e' medial:    5.55 cm/s LV PW:         1.40 cm  LV E/e' medial:  10.4 LV IVS:        1.40 cm  LV e' lateral:   7.07 cm/s LVOT diam:     2.10 cm  LV E/e' lateral: 8.2 LV SV:         54 LV SV  Index:   29 LVOT Area:     3.46 cm  RIGHT VENTRICLE RV S prime:     10.70 cm/s TAPSE (M-mode): 1.6 cm LEFT ATRIUM             Index       RIGHT ATRIUM           Index LA diam:        3.10 cm 1.67 cm/m  RA Area:     11.70 cm LA Vol (A2C):   47.7 ml 25.71 ml/m RA Volume:   26.00 ml  14.01 ml/m LA Vol (A4C):   31.2 ml 16.81 ml/m LA Biplane Vol: 40.7 ml 21.93 ml/m  AORTIC VALVE LVOT Vmax:   86.80 cm/s LVOT Vmean:  53.000 cm/s LVOT VTI:    0.157 m  AORTA Ao Root diam: 2.80 cm MITRAL VALVE MV Area (PHT): 2.56 cm    SHUNTS MV Decel Time: 296 msec    Systemic VTI:  0.16 m MV E velocity: 57.80 cm/s  Systemic Diam: 2.10 cm MV A velocity: 58.30 cm/s MV E/A ratio:  0.99 Dorris Carnes MD Electronically signed by Dorris Carnes MD Signature Date/Time: 03/28/2020/1:33:57 PM    Final     DISCHARGE EXAMINATION: See progress note from earlier today   DISPOSITION: Home  Discharge Instructions    (HEART FAILURE PATIENTS) Call MD:  Anytime you have any of the following symptoms: 1) 3 pound weight gain in 24 hours or 5 pounds in 1 week 2) shortness of breath, with or without a dry hacking cough 3) swelling in the hands, feet or stomach 4) if you have to sleep on extra pillows at night in order to breathe.   Complete by: As directed    Amb Referral to Cardiac Rehabilitation   Complete by: As directed    Diagnosis:  NSTEMI Coronary Stents     After initial evaluation and assessments completed: Virtual Based Care may be provided alone or in conjunction with Phase 2 Cardiac Rehab based on patient barriers.: Yes   Call MD for:  difficulty breathing, headache or visual disturbances   Complete  by: As directed    Call MD for:  extreme fatigue   Complete by: As directed    Call MD for:  hives   Complete by: As directed    Call MD for:  persistant dizziness or light-headedness   Complete by: As directed    Call MD for:  persistant nausea and vomiting   Complete by: As directed    Call MD for:  severe uncontrolled pain    Complete by: As directed    Call MD for:  temperature >100.4   Complete by: As directed    Diet - low sodium heart healthy   Complete by: As directed    Discharge instructions   Complete by: As directed    Take your medications as prescribed.  You should hear from cardiology office regarding a follow-up visit with them.  You were cared for by a hospitalist during your hospital stay. If you have any questions about your discharge medications or the care you received while you were in the hospital after you are discharged, you can call the unit and asked to speak with the hospitalist on call if the hospitalist that took care of you is not available. Once you are discharged, your primary care physician will handle any further medical issues. Please note that NO REFILLS for any discharge medications will be authorized once you are discharged, as it is imperative that you return to your primary care physician (or establish a relationship with a primary care physician if you do not have one) for your aftercare needs so that they can reassess your need for medications and monitor your lab values. If you do not have a primary care physician, you can call [phone] for a physician referral.   Increase activity slowly   Complete by: As directed         Allergies as of 04/03/2020   No Known Allergies     Medication List    STOP taking these medications   labetalol 200 MG tablet Commonly known as: NORMODYNE     TAKE these medications   aspirin 81 MG EC tablet Take 1 tablet (81 mg total) by mouth daily. Swallow whole. Start taking on: April 04, 2020   atorvastatin 40 MG tablet Commonly known as: LIPITOR Take 1 tablet (40 mg total) by mouth daily at 6 PM.   calcitRIOL 0.25 MCG capsule Commonly known as: ROCALTROL Take 0.25 mcg by mouth every Monday, Wednesday, and Friday.   carvedilol 6.25 MG tablet Commonly known as: COREG Take 1 tablet (6.25 mg total) by mouth 2 (two) times daily  with a meal.   FeroSul 325 (65 FE) MG tablet Generic drug: ferrous sulfate Take 325 mg by mouth daily.   sodium bicarbonate 650 MG tablet Take 650 mg by mouth 2 (two) times daily.   ticagrelor 90 MG Tabs tablet Commonly known as: BRILINTA Take 1 tablet (90 mg total) by mouth 2 (two) times daily.   VITAMIN D PO Take 1 tablet by mouth daily.         Follow-up Information    [facility]. Go on 05/02/2020.   Why: 2:15 pm  with Dr. Karle Plumber Contact information: [place] [address] [place], PA-C Follow up on 04/10/2020.   Specialties: Cardiology, Radiology Why: at 2:15pm on 04/10/20 Contact information: [place] [address] [phone]  TOTAL DISCHARGE TIME: 35-minute  [place]  [facility] operational officer on [email or web address]  04/03/2020, 11:46 AM

## 2020-04-03 NOTE — Progress Notes (Signed)
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  Pain is worse with deep breathing and coughing.  Pain is not exertional.  Never had similar pain.  In ED, hemodynamically stable.  WBC 14.3.  Hgb 11.2.  Cr 11.16.  BUN 44.  Bicarb 17.  BNP 160.7.  Troponin 1300.  EKG without acute ischemic finding or normal intervals.  She was admitted with concern for pericarditis although etiology was not clear.  Nephrology and cardiology consulted.  TTE ordered.  Patient had no further cardiopulmonary symptoms but troponin peaked at 4690. TTE with LVEF of 40%, akinesis of distal inferior, anterior and septal walls, G1-DD.  Cardiology consulted  [place] revealed 99% and 70% stenosis in proximal and mid LAD respectively.  She had DES stent to proximal and mid LAD on 03/29/2020.  Started on Brilinta and aspirin.  Nephrology started hemodialysis. CLIP in process.  Subjective: Patient denies any complaints this morning.  Waiting to be established at her dialysis center.  No chest pain shortness of breath nausea vomiting.  Has been ambulating without difficulty.    Objective: Vitals:   04/02/20 1705 04/02/20 2312 04/03/20 0505 04/03/20 0827  BP: 134/82 123/74 123/72 114/83  Pulse:  99 100 99  Resp: 16 16 18    Temp:  99 F (37.2 C) 99.3 F (37.4 C)   TempSrc:  Oral Oral   SpO2:  99% 100%   Weight:   74.9 kg   Height:        Intake/Output Summary (Last 24 hours) at 04/03/2020 1008 Last data filed at 04/03/2020 0839 Gross per 24 hour  Intake 540 ml  Output 500 ml  Net 40 ml   Filed Weights   04/02/20 1301 04/02/20 1620 04/03/20  0505  Weight: 77.3 kg 76.7 kg 74.9 kg    Examination:  General appearance: Awake alert.  In no distress Resp: Clear to auscultation bilaterally.  Normal effort Cardio: S1-S2 is normal regular.  No S3-S4.  No rubs murmurs or bruit GI: Abdomen is soft.  Nontender nondistended.  Bowel sounds are present normal.  No masses organomegaly Extremities: No edema.  Full range of motion of lower extremities. Neurologic: Alert and oriented x3.  No focal neurological deficits.    Procedures:  04/18/2020-LHC revealed 99% and 70% stenosis in proximal and mid LAD respectively.  She had DES stent to proximal and mid LAD  Microbiology summarized: COVID-19 and influenza PCR nonreactive.  Assessment & Plan:  Non-STEMI/CAD Presented with atypical chest pain (sharp and at rest).  Troponin trended from 1300>> 4691.  No significant finding on EKG. TTE with LVEF of 40%, akinesis of distal inferior, anterior and septal walls, G1-DD. LHC revealed 99% and 70% stenosis in proximal and mid LAD respectively.  She had DES stent to proximal and mid LAD on 03/29/2020.  -Continue Coreg -DAPT with Brilinta and aspirin for 1 year -Cardiology to arrange outpatient follow-up. Patient is stable from a respiratory standpoint.  Brilinta will need to be arranged at [place].    Acute combined CHF/ICM Noted to have a EF of 40%.  Appears to be euvolemic.  Volume being managed with dialysis.    ESRD, new Nephrology has been following.  Patient has a fistula in her left arm.  Currently on dialysis.  Looks like they are planning a TTS schedule.  Patient waiting to be established at outpatient dialysis center.   Initial plan was for PD and renal transplant down the road but started on HD this admission. Makes urine.  -CLIP in process.  Renal osteodystrophy/metabolic acidosis -Per nephrology.  Anemia of renal disease Hemoglobin is stable.  Recheck tomorrow.  Essential hypertension -Now on Coreg blood pressure well  controlled.  Will not be too aggressive considering that she is will be on hemodialysis  Leukocytosis Unspecified.  No obvious source of infection. Continue trending  DVT prophylaxis: On SCDs Code Status: Full code Family Communication: Discussed with patient Status is: Inpatient  Remains inpatient appropriate because:Unsafe d/c plan and Inpatient level of care appropriate due to severity of illness   Dispo: The patient is from: Home              Anticipated d/c is to: Home              Anticipated d/c date is: 1 day. Needs CLIP for HD              Patient currently is not medically stable to d/c.     Consultants:  Nephrology Cardiology-signed off   Sch Meds:  Scheduled Meds: . aspirin EC  81 mg Oral Daily  . atorvastatin  40 mg Oral q1800  . calcitRIOL  0.25 mcg Oral Q M,W,F  . carvedilol  6.25 mg Oral BID WC  . cholecalciferol  400 Units Oral Daily  . darbepoetin (ARANESP) injection - DIALYSIS  60 mcg Intravenous Q Tue-HD  . influenza vac split quadrivalent PF  0.5 mL Intramuscular Tomorrow-1000  . pneumococcal 13-valent conjugate vaccine  0.5 mL Intramuscular Tomorrow-1000  . sodium bicarbonate  650 mg Oral BID  . sodium chloride flush  3 mL Intravenous Q12H  . sodium chloride flush  3 mL Intravenous Q12H  . ticagrelor  90 mg Oral BID   Continuous Infusions: . sodium chloride     PRN Meds:.sodium chloride, acetaminophen **OR** acetaminophen, ondansetron **OR** ondansetron (ZOFRAN) IV, sodium chloride flush  Antimicrobials: Anti-infectives (From admission, onward)   None        CBC: Recent Labs  Lab 03/27/20 1419 03/28/20 0424 03/29/20 0338 03/30/20 0145 04/02/20 0258  WBC 14.3* 15.5* 13.4* 13.8* 14.0*  NEUTROABS  --   --  8.5*  --   --   HGB 11.2* 9.8* 9.9* 9.4* 9.0*  HCT 35.1* 31.4* 30.2* 29.3* 26.8*  MCV 84.4 84.0 83.0 81.4 81.7  PLT 403* 339 311 308 311   BMP &GFR Recent Labs  Lab 03/27/20 1419 03/28/20 0424 03/29/20 0338 03/30/20 0145  04/02/20 0258  NA 139 137 136 133* 135  K 4.8 4.5 4.5 4.1 4.2  CL 108 105 103 101 101  CO2 17* 20* 20* 21* 22  GLUCOSE 106* 114* 90 132* 106*  BUN 44* 54* 51* 38* 46*  CREATININE 11.16* 10.78* 10.60* 8.58* 10.15*  CALCIUM 9.4 8.7* 8.5* 8.6* 8.7*  MG  --   --  1.7 1.7 2.0  PHOS  --   --  5.6* 2.7 5.7*   Estimated Creatinine Clearance: 7.5 mL/min (A) (by C-G formula based on SCr of 10.15 mg/dL (H)). Liver & Pancreas: Recent Labs  Lab 03/29/20 0338 03/30/20 0145 04/02/20 0258  ALBUMIN 3.3* 3.2* 3.2*  Anemia Panel: Recent Labs    04/02/20 1431  FERRITIN 71  TIBC 323  IRON 33    Microbiology: Recent Results (from the past 240 hour(s))  Resp Panel by RT-PCR (Flu A&B, Covid) Nasopharyngeal Swab     Status: None   Collection Time: 03/27/20  3:34 PM   Specimen: Nasopharyngeal Swab; Nasopharyngeal(NP) swabs in vial transport medium  Result Value Ref Range Status   SARS Coronavirus 2 by RT PCR NEGATIVE NEGATIVE Final    Comment: (NOTE) SARS-CoV-2 target nucleic acids are NOT DETECTED.  The SARS-CoV-2 RNA is generally detectable in upper respiratory specimens during the acute phase of infection. The lowest concentration of SARS-CoV-2 viral copies this assay can detect is 138 copies/mL. A negative result does not preclude SARS-Cov-2 infection and should not be used as the sole basis for treatment or other patient management decisions. A negative result may occur with  improper specimen collection/handling, submission of specimen other than nasopharyngeal swab, presence of viral mutation(s) within the areas targeted by this assay, and inadequate number of viral copies(<138 copies/mL). A negative result must be combined with clinical observations, patient history, and epidemiological information. The expected result is Negative.  Fact Sheet for Patients:  EntrepreneurPulse.com.au  Fact Sheet for Healthcare Providers:   IncredibleEmployment.be  This test is no t yet approved or cleared by the [place] FDA and  has been authorized for detection and/or diagnosis of SARS-CoV-2 by FDA under an Emergency Use Authorization (EUA). This EUA will remain  in effect (meaning this test can be used) for the duration of the COVID-19 declaration under Section 564(b)(1) of the Act, 21 U.S.C.section 360bbb-3(b)(1), unless the authorization is terminated  or revoked sooner.       Influenza A by PCR NEGATIVE NEGATIVE Final   Influenza B by PCR NEGATIVE NEGATIVE Final    Comment: (NOTE) The Xpert Xpress SARS-CoV-2/FLU/RSV plus assay is intended as an aid in the diagnosis of influenza from Nasopharyngeal swab specimens and should not be used as a sole basis for treatment. Nasal washings and aspirates are unacceptable for Xpert Xpress SARS-CoV-2/FLU/RSV testing.  Fact Sheet for Patients: EntrepreneurPulse.com.au  Fact Sheet for Healthcare Providers: IncredibleEmployment.be  This test is not yet approved or cleared by the [place] FDA and has been authorized for detection and/or diagnosis of SARS-CoV-2 by FDA under an Emergency Use Authorization (EUA). This EUA will remain in effect (meaning this test can be used) for the duration of the COVID-19 declaration under Section 564(b)(1) of the Act, 21 U.S.C. section 360bbb-3(b)(1), unless the authorization is terminated or revoked.  Performed at [facility], [place] [address], [place], [place] [address]     Radiology Studies: No results found.    [place]  [facility]  If 7PM-7AM, please contact night-coverage [email or web address] 04/03/2020, 10:08 AM

## 2020-04-03 NOTE — Progress Notes (Signed)
Patient has been admitted at [facility] HD clinic on a MWF schedule with a seat time of 11:00am. She needs to arrive to her appointments at 10:40am.  Nephrologist states patient can have her next HD treatment at her outpatient clinic on Friday, 04/05/20. She needs to arrive to her first appointment at 10:00am in order to sign intake paperwork prior to her first treatment.  Navigator met with patient to inform of above verbally and in writing. Patient states understanding and can teach back. She again states that she has her own transportation. She states no questions or concerns and thanked Navigator.  Attending updated and states patient can discharge today. Renal Navigator asked Renal PA/D. Zeyfang to send outpatient orders to [facility] for treatment starting Friday.  Alphonzo Cruise, [place] Renal Navigator [phone]

## 2020-04-04 ENCOUNTER — Telehealth: Payer: Self-pay | Admitting: Nephrology

## 2020-04-04 NOTE — Telephone Encounter (Signed)
Transition of care contact from inpatient facility  Date of discharge: 04/03/20 Date of contact: 04/04/20 Method: Phone Spoke to: Patient  Patient contacted to discuss transition of care from recent inpatient hospitalization. Patient was admitted to [facility] from 03/27/20 to 04/03/20... with discharge diagnosis of  ESRD new start HD, CAD  Non-Stemi, Iron deficiency anemia   Medication changes were reviewed. Told to stop iron and sod bicarb   Patient will follow up with his/her outpatient HD unit on:

## 2020-04-05 ENCOUNTER — Telehealth (HOSPITAL_COMMUNITY): Payer: Self-pay

## 2020-04-05 NOTE — Telephone Encounter (Signed)
Attempted to call patient in regards to Cardiac Rehab - LM on VM 

## 2020-04-05 NOTE — Telephone Encounter (Signed)
TOC attempt #1 - left message to call back (plus appt date/time/location/provider)

## 2020-04-09 NOTE — Telephone Encounter (Signed)
TOC attempt x2. VM full.

## 2020-04-10 ENCOUNTER — Other Ambulatory Visit: Payer: Self-pay

## 2020-04-10 ENCOUNTER — Ambulatory Visit: Payer: POS | Admitting: Cardiology

## 2020-04-10 ENCOUNTER — Encounter: Payer: Self-pay | Admitting: Cardiology

## 2020-04-10 VITALS — BP 114/70 | HR 95 | Ht 67.0 in | Wt 163.0 lb

## 2020-04-10 DIAGNOSIS — I10 Essential (primary) hypertension: Secondary | ICD-10-CM | POA: Insufficient documentation

## 2020-04-10 DIAGNOSIS — E785 Hyperlipidemia, unspecified: Secondary | ICD-10-CM | POA: Insufficient documentation

## 2020-04-10 DIAGNOSIS — I255 Ischemic cardiomyopathy: Secondary | ICD-10-CM

## 2020-04-10 DIAGNOSIS — I2510 Atherosclerotic heart disease of native coronary artery without angina pectoris: Secondary | ICD-10-CM

## 2020-04-10 DIAGNOSIS — Z9861 Coronary angioplasty status: Secondary | ICD-10-CM

## 2020-04-10 DIAGNOSIS — I214 Non-ST elevation (NSTEMI) myocardial infarction: Secondary | ICD-10-CM

## 2020-04-10 DIAGNOSIS — N186 End stage renal disease: Secondary | ICD-10-CM

## 2020-04-10 MED ORDER — NITROGLYCERIN 0.4 MG SL SUBL: 0.4000 mg | SUBLINGUAL_TABLET | SUBLINGUAL | 3 refills | Status: DC | PRN

## 2020-04-10 MED ORDER — ATORVASTATIN CALCIUM 40 MG PO TABS: 40.0000 mg | ORAL_TABLET | Freq: Every day | ORAL | 3 refills | Status: DC

## 2020-04-10 MED ORDER — TICAGRELOR 90 MG PO TABS: 90.0000 mg | ORAL_TABLET | Freq: Two times a day (BID) | ORAL | 3 refills | Status: DC

## 2020-04-10 MED ORDER — CARVEDILOL 6.25 MG PO TABS: 6.2500 mg | ORAL_TABLET | Freq: Two times a day (BID) | ORAL | 3 refills | Status: DC

## 2020-04-10 NOTE — Assessment & Plan Note (Signed)
On dialysis s/p cath/ PCI

## 2020-04-10 NOTE — Assessment & Plan Note (Signed)
EF 40% with moderate LVH, grade 1 DD and anterior apical WMA on admission

## 2020-04-10 NOTE — Assessment & Plan Note (Signed)
LDL 144- high dose statin added

## 2020-04-10 NOTE — Patient Instructions (Signed)
Medication Instructions:  Continue current medications  *If you need a refill on your cardiac medications before your next appointment, please call your pharmacy*   Lab Work: None Ordered   Testing/Procedures: None Ordered   Follow-Up: At [facility], you and your health needs are our priority.  As part of our continuing mission to provide you with exceptional heart care, we have created designated Provider Care Teams.  These Care Teams include your primary Cardiologist (physician) and Advanced Practice Providers (APPs -  Physician Assistants and Nurse Practitioners) who all work together to provide you with the care you need, when you need it.  We recommend signing up for the patient portal called "MyChart".  Sign up information is provided on this After Visit Summary.  MyChart is used to connect with patients for Virtual Visits (Telemedicine).  Patients are able to view lab/test results, encounter notes, upcoming appointments, etc.  Non-urgent messages can be sent to your provider as well.   To learn more about what you can do with MyChart, go to [email or web address].    Your next appointment:   6-8 week(s)  The format for your next appointment:   In Person  Provider:   You may see Kirk Ruths, MD or one of the following Advanced Practice Providers on your designated Care Team:    Kerin Ransom, PA-C  [place], [place]  Coletta Memos, [place]

## 2020-04-10 NOTE — Telephone Encounter (Signed)
Patient contacted regarding discharge from [facility] on 04/03/20.  Patient understands to follow up with provider Kerin Ransom, PA on 04/10/20 at 2:15 pm  at [facility]. Patient understands discharge instructions? yes Patient understands medications and regiment? yes Patient understands to bring all medications to this visit? yes

## 2020-04-10 NOTE — Assessment & Plan Note (Signed)
03/27/2020- Troponin peak 4677

## 2020-04-10 NOTE — Assessment & Plan Note (Signed)
Proximal to mid LAD PCI- DES 03/28/2020 R-L collaterals at cath

## 2020-04-10 NOTE — Progress Notes (Signed)
Cardiology Office Note:    Date:  04/10/2020   ID:  Stacie Greene, DOB 1976-04-15, MRN [id number]  PCP:  Pcp, No  Cardiologist:  No primary care provider on file.  Electrophysiologist:  None   Referring MD: No ref. provider found   Chief Complaint  Patient presents with  . 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  She was being followed by the [facility] renal transplant service. She presented 03/27/2020 with [place].  She ruled in for a NSTEMI- Troponin peak 4691.  Echo showed an EF of 40% with anterior WMA.  Cath 03/29/2020- showed 99% pLAD with R-L collaterals, 70% mLAD, 40% dLAD. She is now on HD, followed by [facility].   Since discharge she has done well, no further chest pain.  She has been complaint with her medications.    Past Medical History:  Diagnosis Date  . CKD (chronic kidney disease), stage IV ([place])   . Hypertension     Past Surgical History:  Procedure Laterality Date  . APPENDECTOMY    . CORONARY STENT INTERVENTION N/A 03/29/2020   Procedure: CORONARY STENT INTERVENTION;  Surgeon: Burnell Blanks, MD;  Location: [facility];  Service: Cardiovascular;  Laterality: N/A;  . INTRAVASCULAR ULTRASOUND/IVUS N/A 03/29/2020   Procedure: Intravascular Ultrasound/IVUS;  Surgeon: Burnell Blanks, MD;  Location: [place] [facility];  Service: Cardiovascular;  Laterality: N/A;  . LEFT HEART CATH AND CORONARY ANGIOGRAPHY N/A 03/29/2020   Procedure: LEFT HEART CATH AND CORONARY ANGIOGRAPHY;  Surgeon: Burnell Blanks, MD;  Location: [place];  Service: Cardiovascular;  Laterality: N/A;  . THYROID SURGERY      Current Medications: Current Meds  Medication Sig  . aspirin EC 81 MG EC tablet Take 1 tablet (81 mg total) by mouth daily. Swallow whole.  . calcitRIOL  (ROCALTROL) 0.25 MCG capsule Take 0.25 mcg by mouth every Monday, Wednesday, and Friday.  . FEROSUL 325 (65 Fe) MG tablet Take 325 mg by mouth daily.  . sodium bicarbonate 650 MG tablet Take 650 mg by mouth 2 (two) times daily.  Marland Kitchen VITAMIN D PO Take 1 tablet by mouth daily.  . [DISCONTINUED] atorvastatin (LIPITOR) 40 MG tablet Take 1 tablet (40 mg total) by mouth daily at 6 PM.  . [DISCONTINUED] carvedilol (COREG) 6.25 MG tablet Take 1 tablet (6.25 mg total) by mouth 2 (two) times daily with a meal.  . [DISCONTINUED] ticagrelor (BRILINTA) 90 MG TABS tablet Take 1 tablet (90 mg total) by mouth 2 (two) times daily.     Allergies:   Patient has no known allergies.   Social History   Socioeconomic History  . Marital status: Single    Spouse name: Not on file  . Number of children: Not on file  . Years of education: Not on file  . Highest education level: Not on file  Occupational History  . Not on file  Tobacco Use  . Smoking status: Former Research scientist (life sciences)  . Smokeless tobacco: Never Used  Vaping Use  . Vaping Use: Never used  Substance and Sexual Activity  . Alcohol use: Not Currently  . Drug use: Never  . Sexual activity: Not on file  Other Topics Concern  . Not on file  Social History Narrative  . Not on file  Social Determinants of Health   Financial Resource Strain: Not on file  Food Insecurity: Not on file  Transportation Needs: Not on file  Physical Activity: Not on file  Stress: Not on file  Social Connections: Not on file     Family History: The patient's family history is not on file.  ROS:   Please see the history of present illness.     All other systems reviewed and are negative.  EKGs/Labs/Other Studies Reviewed:    The following studies were reviewed today: Cath/PCI 03/29/2020-  EKG:  EKG is ordered today.  The ekg ordered today demonstrates NSR, TWI AVL, V2, poor anterior RW  Recent Labs: 03/27/2020: B Natriuretic Peptide 160.7 03/29/2020: TSH  2.953 04/02/2020: BUN 46; Creatinine, Ser 10.15; Hemoglobin 9.0; Magnesium 2.0; Platelets 311; Potassium 4.2; Sodium 135  Recent Lipid Panel    Component Value Date/Time   CHOL 205 (H) 03/28/2020 1409   TRIG 174 (H) 03/28/2020 1409   HDL 26 (L) 03/28/2020 1409   CHOLHDL 7.9 03/28/2020 1409   VLDL 35 03/28/2020 1409   LDLCALC 144 (H) 03/28/2020 1409    Physical Exam:    VS:  BP 114/70 (BP Location: Right Arm, Patient Position: Sitting, Cuff Size: Normal)   Pulse 95   Ht 5\' 7"  (1.702 m)   Wt 163 lb (73.9 kg)   BMI 25.53 kg/m     Wt Readings from Last 3 Encounters:  04/10/20 163 lb (73.9 kg)  04/03/20 165 lb 3.2 oz (74.9 kg)     GEN: Well nourished, well developed female in no acute distress HEENT: Normal NECK: No JVD; No carotid bruits CARDIAC: RRR, no murmurs, rubs, gallops RESPIRATORY:  Clear to auscultation without rales, wheezing or rhonchi  ABDOMEN: Soft, non-distended MUSCULOSKELETAL:  No edema; No deformity  SKIN: Warm and dry NEUROLOGIC:  Alert and oriented x 3 PSYCHIATRIC:  Normal affect   ASSESSMENT:    NSTEMI (non-ST elevated myocardial infarction) ([place]) 03/27/2020- Troponin peak 4677  CAD S/P percutaneous coronary angioplasty Proximal to mid LAD PCI- DES 03/28/2020 R-L collaterals at cath  ESRD (end stage renal disease) ([place]) On dialysis s/p cath/ PCI  Ischemic cardiomyopathy EF 40% with moderate LVH, grade 1 DD and anterior apical WMA on admission  Essential hypertension Controlled- Moderate LVH on echo  Dyslipidemia, goal LDL below 70 LDL 144- high dose statin added  PLAN:    Same Rx. OK to return to work 04/28/2020 without restrictions. F/U 6-8 weeks.  Check lipids and consider f/u echo then.    Medication Adjustments/Labs and Tests Ordered: Current medicines are reviewed at length with the patient today.  Concerns regarding medicines are outlined above.  Orders Placed This Encounter  Procedures  . EKG 12-Lead   Meds ordered this  encounter  Medications  . nitroGLYCERIN (NITROSTAT) 0.4 MG SL tablet    Sig: Place 1 tablet (0.4 mg total) under the tongue every 5 (five) minutes as needed for chest pain.    Dispense:  25 tablet    Refill:  3  . atorvastatin (LIPITOR) 40 MG tablet    Sig: Take 1 tablet (40 mg total) by mouth daily at 6 PM.    Dispense:  90 tablet    Refill:  3  . carvedilol (COREG) 6.25 MG tablet    Sig: Take 1 tablet (6.25 mg total) by mouth 2 (two) times daily with a meal.    Dispense:  180 tablet    Refill:  3  . ticagrelor (BRILINTA) 90 MG  TABS tablet    Sig: Take 1 tablet (90 mg total) by mouth 2 (two) times daily.    Dispense:  180 tablet    Refill:  3    Patient Instructions  Medication Instructions:  Continue current medications  *If you need a refill on your cardiac medications before your next appointment, please call your pharmacy*   Lab Work: None Ordered   Testing/Procedures: None Ordered   Follow-Up: At [facility], you and your health needs are our priority.  As part of our continuing mission to provide you with exceptional heart care, we have created designated Provider Care Teams.  These Care Teams include your primary Cardiologist (physician) and Advanced Practice Providers (APPs -  Physician Assistants and Nurse Practitioners) who all work together to provide you with the care you need, when you need it.  We recommend signing up for the patient portal called "MyChart".  Sign up information is provided on this After Visit Summary.  MyChart is used to connect with patients for Virtual Visits (Telemedicine).  Patients are able to view lab/test results, encounter notes, upcoming appointments, etc.  Non-urgent messages can be sent to your provider as well.   To learn more about what you can do with MyChart, go to [email or web address].    Your next appointment:   6-8 week(s)  The format for your next appointment:   In Person  Provider:   You may see Kirk Ruths,  MD or one of the following Advanced Practice Providers on your designated Care Team:    Kerin Ransom, PA-C  [place], [place]  Coletta Memos, FNP         Signed, Kerin Ransom, [place]  04/10/2020 2:59 PM    [place]

## 2020-04-10 NOTE — Assessment & Plan Note (Signed)
Controlled- Moderate LVH on echo

## 2020-04-16 ENCOUNTER — Telehealth (HOSPITAL_COMMUNITY): Payer: Self-pay | Admitting: *Deleted

## 2020-04-16 NOTE — Telephone Encounter (Signed)
Attempted to contact pt from cardiac rehab for information regarding which dialysis center and nephrologist she uses. Unable to leave message - voice mailbox is full. Will have support staff sent please contact letter. Cherre Huger, BSN Cardiac and Training and development officer

## 2020-04-23 ENCOUNTER — Encounter (HOSPITAL_COMMUNITY): Payer: Self-pay

## 2020-05-02 ENCOUNTER — Inpatient Hospital Stay: Payer: POS | Admitting: Internal Medicine

## 2020-07-06 NOTE — Progress Notes (Signed)
HPI: Follow-up coronary artery disease.  Patient had non-ST elevation myocardial infarction December 2021.  Cardiac catheterization revealed a 99% proximal LAD with right to left collaterals, 70% mid LAD, 40% distal LAD.  Patient had PCI of the LAD with 2 drug-eluting stents.  Echocardiogram showed ejection fraction 40%.  Patient had a follow-up echocardiogram at [facility] March 2022 as she is being evaluated for renal transplant.  Her ejection fraction had improved to 55 to 60% and there was mild tricuspid regurgitation.  Stress echocardiogram March 2022 was normal. Since last seen she denies dyspnea, chest pain, palpitations or syncope.  Current Outpatient Medications  Medication Sig Dispense Refill  . aspirin EC 81 MG EC tablet Take 1 tablet (81 mg total) by mouth daily. Swallow whole. 30 tablet 11  . atorvastatin (LIPITOR) 40 MG tablet Take 1 tablet (40 mg total) by mouth daily at 6 PM. 90 tablet 3  . calcitRIOL (ROCALTROL) 0.25 MCG capsule Take 0.25 mcg by mouth every Monday, Wednesday, and Friday.    . carvedilol (COREG) 6.25 MG tablet Take 1 tablet (6.25 mg total) by mouth 2 (two) times daily with a meal. 180 tablet 3  . FEROSUL 325 (65 Fe) MG tablet Take 325 mg by mouth daily.    . nitroGLYCERIN (NITROSTAT) 0.4 MG SL tablet Place 1 tablet (0.4 mg total) under the tongue every 5 (five) minutes as needed for chest pain. 25 tablet 3  . sodium bicarbonate 650 MG tablet Take 650 mg by mouth 2 (two) times daily.    . ticagrelor (BRILINTA) 90 MG TABS tablet Take 1 tablet (90 mg total) by mouth 2 (two) times daily. 180 tablet 3  . VITAMIN D PO Take 1 tablet by mouth daily.     No current facility-administered medications for this visit.     Past Medical History:  Diagnosis Date  . CKD (chronic kidney disease), stage IV ([place])   . Hypertension     Past Surgical History:  Procedure Laterality Date  . APPENDECTOMY    . CORONARY STENT INTERVENTION N/A 03/29/2020   Procedure: CORONARY  STENT INTERVENTION;  Surgeon: Burnell Blanks, MD;  Location: [place] [facility];  Service: Cardiovascular;  Laterality: N/A;  . INTRAVASCULAR ULTRASOUND/IVUS N/A 03/29/2020   Procedure: Intravascular Ultrasound/IVUS;  Surgeon: Burnell Blanks, MD;  Location: [place] [facility];  Service: Cardiovascular;  Laterality: N/A;  . LEFT HEART CATH AND CORONARY ANGIOGRAPHY N/A 03/29/2020   Procedure: LEFT HEART CATH AND CORONARY ANGIOGRAPHY;  Surgeon: Burnell Blanks, MD;  Location: [place] [facility];  Service: Cardiovascular;  Laterality: N/A;  . THYROID SURGERY      Social History   Socioeconomic History  . Marital status: Single    Spouse name: Not on file  . Number of children: Not on file  . Years of education: Not on file  . Highest education level: Not on file  Occupational History  . Not on file  Tobacco Use  . Smoking status: Former Research scientist (life sciences)  . Smokeless tobacco: Never Used  Vaping Use  . Vaping Use: Never used  Substance and Sexual Activity  . Alcohol use: Not Currently  . Drug use: Never  . Sexual activity: Not on file  Other Topics Concern  . Not on file  Social History Narrative  . Not on file   Social Determinants of Health   Financial Resource Strain: Not on file  Food Insecurity: Not on file  Transportation Needs: Not on file  Physical Activity: Not on file  Stress: Not on file  Social Connections: Not on file  Intimate Partner Violence: Not on file    History reviewed. No pertinent family history.  ROS: no fevers or chills, productive cough, hemoptysis, dysphasia, odynophagia, melena, hematochezia, dysuria, hematuria, rash, seizure activity, orthopnea, PND, pedal edema, claudication. Remaining systems are negative.  Physical Exam: Well-developed well-nourished in no acute distress.  Skin is warm and dry.  HEENT is normal.  Neck is supple.  Chest is clear to auscultation with normal expansion.  Cardiovascular exam is regular rate and  rhythm.  Abdominal exam nontender or distended. No masses palpated. Extremities show no edema. neuro grossly intact  A/P  1 coronary artery disease status post PCI of LAD-plan to continue aspirin, Brilinta and statin.  Patient denies chest pain.  2 ischemic cardiomyopathy-continue carvedilol.  Blood pressure will not allow ARB or Entresto.  She had a recent follow-up echocardiogram at [facility] that showed normalization of LV function.  3 hyperlipidemia-continue statin.  Check ipids and liver.  4 hypertension-blood pressure controlled.  Continue present medications.  5 end-stage renal disease-dialysis dependent.  Kirk Ruths, MD

## 2020-07-09 ENCOUNTER — Other Ambulatory Visit: Payer: Self-pay

## 2020-07-09 ENCOUNTER — Ambulatory Visit (INDEPENDENT_AMBULATORY_CARE_PROVIDER_SITE_OTHER): Payer: POS | Admitting: Cardiology

## 2020-07-09 ENCOUNTER — Encounter: Payer: Self-pay | Admitting: Cardiology

## 2020-07-09 VITALS — BP 138/72 | HR 88 | Ht 67.0 in | Wt 160.0 lb

## 2020-07-09 DIAGNOSIS — I255 Ischemic cardiomyopathy: Secondary | ICD-10-CM | POA: Diagnosis not present

## 2020-07-09 DIAGNOSIS — I10 Essential (primary) hypertension: Secondary | ICD-10-CM | POA: Diagnosis not present

## 2020-07-09 DIAGNOSIS — E785 Hyperlipidemia, unspecified: Secondary | ICD-10-CM | POA: Diagnosis not present

## 2020-07-09 DIAGNOSIS — I2510 Atherosclerotic heart disease of native coronary artery without angina pectoris: Secondary | ICD-10-CM | POA: Diagnosis not present

## 2020-07-09 DIAGNOSIS — Z9861 Coronary angioplasty status: Secondary | ICD-10-CM

## 2020-07-09 NOTE — Patient Instructions (Addendum)
  Lab Work:  Your physician recommends that you return for lab work FASTING  If you have labs (blood work) drawn today and your tests are completely normal, you will receive your results only by: Marland Kitchen MyChart Message (if you have MyChart) OR . A paper copy in the mail If you have any lab test that is abnormal or we need to change your treatment, we will call you to review the results.   Follow-Up: At [facility], you and your health needs are our priority.  As part of our continuing mission to provide you with exceptional heart care, we have created designated Provider Care Teams.  These Care Teams include your primary Cardiologist (physician) and Advanced Practice Providers (APPs -  Physician Assistants and Nurse Practitioners) who all work together to provide you with the care you need, when you need it.  We recommend signing up for the patient portal called "MyChart".  Sign up information is provided on this After Visit Summary.  MyChart is used to connect with patients for Virtual Visits (Telemedicine).  Patients are able to view lab/test results, encounter notes, upcoming appointments, etc.  Non-urgent messages can be sent to your provider as well.   To learn more about what you can do with MyChart, go to [email or web address].    Your next appointment:   6 month(s)  The format for your next appointment:   In Person  Provider:   Kirk Ruths, MD

## 2020-08-12 ENCOUNTER — Encounter: Payer: Self-pay | Admitting: *Deleted

## 2020-09-20 ENCOUNTER — Encounter: Payer: Self-pay | Admitting: *Deleted

## 2020-10-16 ENCOUNTER — Emergency Department (HOSPITAL_COMMUNITY): Payer: POS

## 2020-10-16 ENCOUNTER — Inpatient Hospital Stay (HOSPITAL_COMMUNITY)
Admission: EM | Admit: 2020-10-16 | Discharge: 2020-10-20 | DRG: 811 | Disposition: A | Discharge status: Home / Self Care | Payer: POS | Attending: Internal Medicine | Admitting: Internal Medicine

## 2020-10-16 ENCOUNTER — Encounter (HOSPITAL_COMMUNITY): Payer: Self-pay

## 2020-10-16 DIAGNOSIS — I120 Hypertensive chronic kidney disease with stage 5 chronic kidney disease or end stage renal disease: Secondary | ICD-10-CM | POA: Diagnosis present

## 2020-10-16 DIAGNOSIS — Z20822 Contact with and (suspected) exposure to covid-19: Secondary | ICD-10-CM | POA: Diagnosis present

## 2020-10-16 DIAGNOSIS — Z87891 Personal history of nicotine dependence: Secondary | ICD-10-CM | POA: Diagnosis not present

## 2020-10-16 DIAGNOSIS — M898X9 Other specified disorders of bone, unspecified site: Secondary | ICD-10-CM | POA: Diagnosis present

## 2020-10-16 DIAGNOSIS — N186 End stage renal disease: Secondary | ICD-10-CM | POA: Diagnosis present

## 2020-10-16 DIAGNOSIS — K449 Diaphragmatic hernia without obstruction or gangrene: Secondary | ICD-10-CM | POA: Diagnosis present

## 2020-10-16 DIAGNOSIS — D631 Anemia in chronic kidney disease: Secondary | ICD-10-CM | POA: Diagnosis present

## 2020-10-16 DIAGNOSIS — Z992 Dependence on renal dialysis: Secondary | ICD-10-CM

## 2020-10-16 DIAGNOSIS — K921 Melena: Secondary | ICD-10-CM | POA: Diagnosis not present

## 2020-10-16 DIAGNOSIS — K5520 Angiodysplasia of colon without hemorrhage: Secondary | ICD-10-CM | POA: Diagnosis present

## 2020-10-16 DIAGNOSIS — Z9861 Coronary angioplasty status: Secondary | ICD-10-CM

## 2020-10-16 DIAGNOSIS — D62 Acute posthemorrhagic anemia: Secondary | ICD-10-CM | POA: Diagnosis present

## 2020-10-16 DIAGNOSIS — R8271 Bacteriuria: Secondary | ICD-10-CM | POA: Diagnosis not present

## 2020-10-16 DIAGNOSIS — K922 Gastrointestinal hemorrhage, unspecified: Secondary | ICD-10-CM | POA: Diagnosis not present

## 2020-10-16 DIAGNOSIS — Z7682 Awaiting organ transplant status: Secondary | ICD-10-CM | POA: Diagnosis not present

## 2020-10-16 DIAGNOSIS — E861 Hypovolemia: Secondary | ICD-10-CM | POA: Diagnosis present

## 2020-10-16 DIAGNOSIS — E876 Hypokalemia: Secondary | ICD-10-CM | POA: Diagnosis not present

## 2020-10-16 DIAGNOSIS — Z79899 Other long term (current) drug therapy: Secondary | ICD-10-CM

## 2020-10-16 DIAGNOSIS — D72829 Elevated white blood cell count, unspecified: Secondary | ICD-10-CM | POA: Diagnosis present

## 2020-10-16 DIAGNOSIS — Z7902 Long term (current) use of antithrombotics/antiplatelets: Secondary | ICD-10-CM

## 2020-10-16 DIAGNOSIS — I10 Essential (primary) hypertension: Secondary | ICD-10-CM | POA: Diagnosis not present

## 2020-10-16 DIAGNOSIS — E875 Hyperkalemia: Secondary | ICD-10-CM | POA: Diagnosis not present

## 2020-10-16 DIAGNOSIS — D509 Iron deficiency anemia, unspecified: Secondary | ICD-10-CM | POA: Diagnosis not present

## 2020-10-16 DIAGNOSIS — I2510 Atherosclerotic heart disease of native coronary artery without angina pectoris: Secondary | ICD-10-CM | POA: Diagnosis present

## 2020-10-16 DIAGNOSIS — R109 Unspecified abdominal pain: Secondary | ICD-10-CM | POA: Diagnosis not present

## 2020-10-16 DIAGNOSIS — K31819 Angiodysplasia of stomach and duodenum without bleeding: Secondary | ICD-10-CM | POA: Diagnosis not present

## 2020-10-16 DIAGNOSIS — Z955 Presence of coronary angioplasty implant and graft: Secondary | ICD-10-CM | POA: Diagnosis not present

## 2020-10-16 DIAGNOSIS — R8281 Pyuria: Secondary | ICD-10-CM | POA: Diagnosis not present

## 2020-10-16 DIAGNOSIS — Z7982 Long term (current) use of aspirin: Secondary | ICD-10-CM

## 2020-10-16 DIAGNOSIS — K648 Other hemorrhoids: Secondary | ICD-10-CM | POA: Diagnosis present

## 2020-10-16 DIAGNOSIS — D649 Anemia, unspecified: Secondary | ICD-10-CM

## 2020-10-16 DIAGNOSIS — R0602 Shortness of breath: Secondary | ICD-10-CM

## 2020-10-16 DIAGNOSIS — E785 Hyperlipidemia, unspecified: Secondary | ICD-10-CM | POA: Diagnosis present

## 2020-10-16 LAB — RETICULOCYTES
Immature Retic Fract: 37.7 % — ABNORMAL HIGH (ref 2.3–15.9)
RBC.: 1.4 MIL/uL — ABNORMAL LOW (ref 3.87–5.11)
Retic Count, Absolute: 155.1 10*3/uL (ref 19.0–186.0)
Retic Ct Pct: 11.1 % — ABNORMAL HIGH (ref 0.4–3.1)

## 2020-10-16 LAB — CBC WITH DIFFERENTIAL/PLATELET
Abs Immature Granulocytes: 0.18 10*3/uL — ABNORMAL HIGH (ref 0.00–0.07)
Basophils Absolute: 0 10*3/uL (ref 0.0–0.1)
Basophils Relative: 0 %
Eosinophils Absolute: 0 10*3/uL (ref 0.0–0.5)
Eosinophils Relative: 0 %
HCT: 13.6 % — ABNORMAL LOW (ref 36.0–46.0)
Hemoglobin: 4.1 g/dL — CL (ref 12.0–15.0)
Immature Granulocytes: 1 %
Lymphocytes Relative: 15 %
Lymphs Abs: 2.8 10*3/uL (ref 0.7–4.0)
MCH: 28.5 pg (ref 26.0–34.0)
MCHC: 30.1 g/dL (ref 30.0–36.0)
MCV: 94.4 fL (ref 80.0–100.0)
Monocytes Absolute: 0.7 10*3/uL (ref 0.1–1.0)
Monocytes Relative: 4 %
Neutro Abs: 15.6 10*3/uL — ABNORMAL HIGH (ref 1.7–7.7)
Neutrophils Relative %: 80 %
Platelets: 355 10*3/uL (ref 150–400)
RBC: 1.44 MIL/uL — ABNORMAL LOW (ref 3.87–5.11)
RDW: 18.2 % — ABNORMAL HIGH (ref 11.5–15.5)
WBC: 19.4 10*3/uL — ABNORMAL HIGH (ref 4.0–10.5)
nRBC: 0.2 % (ref 0.0–0.2)

## 2020-10-16 LAB — COMPREHENSIVE METABOLIC PANEL
ALT: 18 U/L (ref 0–44)
AST: 11 U/L — ABNORMAL LOW (ref 15–41)
Albumin: 3.9 g/dL (ref 3.5–5.0)
Alkaline Phosphatase: 52 U/L (ref 38–126)
Anion gap: 12 (ref 5–15)
BUN: 118 mg/dL — ABNORMAL HIGH (ref 6–20)
CO2: 12 mmol/L — ABNORMAL LOW (ref 22–32)
Calcium: 8.6 mg/dL — ABNORMAL LOW (ref 8.9–10.3)
Chloride: 113 mmol/L — ABNORMAL HIGH (ref 98–111)
Creatinine, Ser: 10.66 mg/dL — ABNORMAL HIGH (ref 0.44–1.00)
GFR, Estimated: 4 mL/min — ABNORMAL LOW (ref >60)
Glucose, Bld: 127 mg/dL — ABNORMAL HIGH (ref 70–99)
Potassium: 5.1 mmol/L (ref 3.5–5.1)
Sodium: 137 mmol/L (ref 135–145)
Total Bilirubin: 0.5 mg/dL (ref 0.3–1.2)
Total Protein: 7 g/dL (ref 6.5–8.1)

## 2020-10-16 LAB — IRON AND TIBC
Iron: 84 ug/dL (ref 28–170)
Saturation Ratios: 26 % (ref 10.4–31.8)
TIBC: 322 ug/dL (ref 250–450)
UIBC: 238 ug/dL

## 2020-10-16 LAB — LIPASE, BLOOD: Lipase: 47 U/L (ref 11–51)

## 2020-10-16 LAB — VITAMIN B12: Vitamin B-12: 138 pg/mL — ABNORMAL LOW (ref 180–914)

## 2020-10-16 LAB — POC OCCULT BLOOD, ED: Fecal Occult Bld: POSITIVE — AB

## 2020-10-16 LAB — FERRITIN: Ferritin: 412 ng/mL — ABNORMAL HIGH (ref 11–307)

## 2020-10-16 LAB — FOLATE: Folate: 6.1 ng/mL (ref >5.9)

## 2020-10-16 MED ORDER — ONDANSETRON 4 MG PO TBDP
4.0000 mg | ORAL_TABLET | Freq: Once | ORAL | Status: AC | PRN
  Administered 2020-10-16: 4 mg via ORAL
  Filled 2020-10-16: qty 1

## 2020-10-16 MED ORDER — ONDANSETRON HCL 4 MG/2ML IJ SOLN
4.0000 mg | Freq: Four times a day (QID) | INTRAMUSCULAR | Status: DC | PRN
  Administered 2020-10-17 – 2020-10-18 (×2): 4 mg via INTRAVENOUS
  Filled 2020-10-16 (×2): qty 2

## 2020-10-16 MED ORDER — PANTOPRAZOLE SODIUM 40 MG IV SOLR: 40.0000 mg | Freq: Every day | INTRAVENOUS | Status: DC

## 2020-10-16 MED ORDER — PANTOPRAZOLE SODIUM 40 MG IV SOLR
40.0000 mg | Freq: Once | INTRAVENOUS | Status: AC
  Administered 2020-10-16: 40 mg via INTRAVENOUS
  Filled 2020-10-16: qty 40

## 2020-10-16 MED ORDER — SODIUM CHLORIDE 0.9 % IV SOLN
10.0000 mL/h | Freq: Once | INTRAVENOUS | Status: AC
  Administered 2020-10-16: 10 mL/h via INTRAVENOUS

## 2020-10-16 NOTE — ED Triage Notes (Signed)
Pt arrived via walk in, c/o diffuse abd pain.   Has not been to dialysis x2 weeks.

## 2020-10-16 NOTE — ED Provider Notes (Signed)
[facility] DEPT Provider Note   CSN: [id number] Arrival date & time: 10/16/20  1431     History Chief Complaint  Patient presents with   Abdominal Pain    Stacie Greene is a 45 y.o. female.  The history is provided by the patient.  Abdominal Pain Associated symptoms: shortness of breath   Associated symptoms: no vaginal bleeding   Patient presents with nausea.  Only mild abdominal pain with it.  She is an end-stage renal disease patient.  On dialysis but has not gone over the last 2 weeks due to work.  No vomiting just nausea.  States she is dizzy.  Gets lightheaded when she tries to move.  Has had some black stool.  Had been triage before I seen her and has a hemoglobin of 4.4.  No known history of anemia.  States she was trying to get at home dialysis set up but has not been arranged yet.  Gets dialysis at [facility].  States her nephrologist is the doctor there which sounds to be one of the doctors at [facility].  Has not seen gastroenterology previously.  States does not have a primary care doctor.  Patient is on Brilinta for previous cardiac stents.  Also on baby aspirin.  States however she ran out of Brilinta and did not have today's dose.    Past Medical History:  Diagnosis Date   CKD (chronic kidney disease), stage IV ([place])    Hypertension     Patient Active Problem List   Diagnosis Date Noted   CAD S/P percutaneous coronary angioplasty 04/10/2020   Ischemic cardiomyopathy 04/10/2020   Essential hypertension 04/10/2020   Dyslipidemia, goal LDL below 70 04/10/2020   NSTEMI (non-ST elevated myocardial infarction) ([place]) 03/28/2020   ESRD (end stage renal disease) ([place]) 03/27/2020   Pericarditis 03/27/2020    Past Surgical History:  Procedure Laterality Date   APPENDECTOMY     CORONARY STENT INTERVENTION N/A 03/29/2020   Procedure: CORONARY STENT INTERVENTION;  Surgeon: Burnell Blanks, MD;  Location: [place]  [facility];  Service: Cardiovascular;  Laterality: N/A;   INTRAVASCULAR ULTRASOUND/IVUS N/A 03/29/2020   Procedure: Intravascular Ultrasound/IVUS;  Surgeon: Burnell Blanks, MD;  Location: [place] [facility];  Service: Cardiovascular;  Laterality: N/A;   LEFT HEART CATH AND CORONARY ANGIOGRAPHY N/A 03/29/2020   Procedure: LEFT HEART CATH AND CORONARY ANGIOGRAPHY;  Surgeon: Burnell Blanks, MD;  Location: [place] [facility];  Service: Cardiovascular;  Laterality: N/A;   THYROID SURGERY       OB History   No obstetric history on file.     History reviewed. No pertinent family history.  Social History   Tobacco Use   Smoking status: Former    Pack years: 0.00   Smokeless tobacco: Never  Vaping Use   Vaping Use: Never used  Substance Use Topics   Alcohol use: Not Currently   Drug use: Never    Home Medications Prior to Admission medications   Medication Sig Start Date End Date Taking? Authorizing Provider  aspirin 81 MG EC tablet TAKE 1 TABLET (81 MG TOTAL) BY MOUTH DAILY. SWALLOW WHOLE. 04/03/20 04/03/21  Bonnielee Haff, MD  atorvastatin (LIPITOR) 40 MG tablet Take 1 tablet (40 mg total) by mouth daily at 6 PM. 04/10/20   Rosalyn Gess, Doreene Burke, PA-C  calcitRIOL (ROCALTROL) 0.25 MCG capsule Take 0.25 mcg by mouth every Monday, Wednesday, and Friday. 02/26/20   [provider]  carvedilol (COREG) 6.25 MG  tablet Take 1 tablet (6.25 mg total) by mouth 2 (two) times daily with a meal. 04/10/20   Kilroy, Luke K, PA-C  FEROSUL 325 (65 Fe) MG tablet Take 325 mg by mouth daily. 01/15/20   [provider]  nitroGLYCERIN (NITROSTAT) 0.4 MG SL tablet Place 1 tablet (0.4 mg total) under the tongue every 5 (five) minutes as needed for chest pain. 04/10/20 07/09/20  Erlene Quan, PA-C  sodium bicarbonate 650 MG tablet Take 650 mg by mouth 2 (two) times daily. 11/24/19   [provider]  ticagrelor (BRILINTA) 90 MG TABS tablet Take 1 tablet (90 mg total) by mouth 2  (two) times daily. 04/10/20   Erlene Quan, PA-C  VITAMIN D PO Take 1 tablet by mouth daily.    [provider]    Allergies    Patient has no known allergies.  Review of Systems   Review of Systems  Constitutional:  Negative for appetite change.  HENT:  Negative for congestion.   Respiratory:  Positive for shortness of breath.   Gastrointestinal:  Positive for abdominal pain.  Genitourinary:  Negative for vaginal bleeding.  Musculoskeletal:  Negative for back pain.  Skin:  Negative for rash.  Neurological:  Positive for dizziness and light-headedness.  Psychiatric/Behavioral:  Negative for confusion.    Physical Exam Updated Vital Signs BP (!) 133/57 (BP Location: Right Arm)   Pulse (!) 107   Temp 98.3 F (36.8 C) (Oral)   Resp 18   Ht '5\' 7"'$  (1.702 m)   Wt 70.3 kg   SpO2 100%   BMI 24.28 kg/m   Physical Exam Vitals and nursing note reviewed.  HENT:     Head: Normocephalic.  Eyes:     Pupils: Pupils are equal, round, and reactive to light.  Cardiovascular:     Comments: Mild tachycardia Pulmonary:     Breath sounds: Normal breath sounds.  Abdominal:     Tenderness: There is no abdominal tenderness.     Hernia: No hernia is present.  Genitourinary:    Comments: Rectal exam showed brown stool.  Guaiac positive immediately. Skin:    General: Skin is warm.  Neurological:     General: No focal deficit present.     Mental Status: She is alert.    ED Results / Procedures / Treatments   Labs (all labs ordered are listed, but only abnormal results are displayed) Labs Reviewed  CBC WITH DIFFERENTIAL/PLATELET - Abnormal; Notable for the following components:      Result Value   WBC 19.4 (*)    RBC 1.44 (*)    Hemoglobin 4.1 (*)    HCT 13.6 (*)    RDW 18.2 (*)    Neutro Abs 15.6 (*)    Abs Immature Granulocytes 0.18 (*)    All other components within normal limits  COMPREHENSIVE METABOLIC PANEL - Abnormal; Notable for the following components:    Chloride 113 (*)    CO2 12 (*)    Glucose, Bld 127 (*)    BUN 118 (*)    Creatinine, Ser 10.66 (*)    Calcium 8.6 (*)    AST 11 (*)    GFR, Estimated 4 (*)    All other components within normal limits  POC OCCULT BLOOD, ED - Abnormal; Notable for the following components:   Fecal Occult Bld POSITIVE (*)    All other components within normal limits  LIPASE, BLOOD  URINALYSIS, ROUTINE W REFLEX MICROSCOPIC  VITAMIN B12  FOLATE  IRON AND TIBC  FERRITIN  RETICULOCYTES    EKG None  Radiology DG Chest 1 View  Result Date: 10/16/2020 CLINICAL DATA:  45 year old female with abdominal pain. EXAM: CHEST  1 VIEW COMPARISON:  Chest radiograph dated 03/27/2020 FINDINGS: Right-sided dialysis catheter with tip at the cavoatrial junction. No focal consolidation, pleural effusion, or pneumothorax. The cardiac silhouette is within limits. Coronary vascular stent. No acute osseous pathology. IMPRESSION: No active cardiopulmonary disease. Electronically Signed   By: Anner Crete M.D.   On: 10/16/2020 16:08    Procedures Procedures   Medications Ordered in ED Medications  pantoprazole (PROTONIX) injection 40 mg (has no administration in time range)  ondansetron (ZOFRAN-ODT) disintegrating tablet 4 mg (4 mg Oral Given 10/16/20 1712)    ED Course  I have reviewed the triage vital signs and the nursing notes.  Pertinent labs & imaging results that were available during my care of the patient were reviewed by me and considered in my medical decision making (see chart for details).    MDM Rules/Calculators/A&P                          Patient presents with nausea.  Benign exam but found to have elevated white count.  I think this is most likely reactive as that she has no tenderness of the belly.  Is only had nausea without pain.  Has had previous hysterectomy.  However found to have a hemoglobin of 4.1.  She is guaiac positive but no gross blood.  We will give some IV Protonix.  However I  think this is likely been going for a while.  She is on Brilinta.  She has been noncompliant with her dialysis to last week 2 weeks ago.  Chest x-ray reassuring does not show volume overload but does have a decreased bicarb which appears to be something she is dealt with before with her end-stage renal disease.  Will discuss with nephrology.  However will require some transfusion.  Anemia labs sent.  Normocytic anemia.  [facility].  Discussed with Dr. Johnney Ou from nephrology.  She is aware of patient and will be following.  No emergent plans for dialysis at this time.  CRITICAL CARE Performed by: Davonna Belling Total critical care time: 30 minutes Critical care time was exclusive of separately billable procedures and treating other patients. Critical care was necessary to treat or prevent imminent or life-threatening deterioration. Critical care was time spent personally by me on the following activities: development of treatment plan with patient and/or surrogate as well as nursing, discussions with consultants, evaluation of patient's response to treatment, examination of patient, obtaining history from patient or surrogate, ordering and performing treatments and interventions, ordering and review of laboratory studies, ordering and review of radiographic studies, pulse oximetry and re-evaluation of patient's condition.  Final Clinical Impression(s) / ED Diagnoses Final diagnoses:  End stage renal disease on dialysis [facility])  Gastrointestinal hemorrhage, unspecified gastrointestinal hemorrhage type  Symptomatic anemia    Rx / DC Orders ED Discharge Orders     None        Davonna Belling, MD 10/16/20 2011

## 2020-10-16 NOTE — ED Notes (Signed)
Provider at the bedside.  

## 2020-10-16 NOTE — ED Notes (Signed)
MD made aware type and screen was never ordered. New orders put in. Lab is drawing blood now. Patient is  a hard stick.

## 2020-10-16 NOTE — H&P (Addendum)
History and Physical    Stacie Greene [id number] DOB: May 15, 1975 DOA: 10/16/2020  PCP: Pcp, No  Patient coming from: Home  I have personally briefly reviewed patient's old medical records in [place]  Chief Complaint: dizziness  HPI: Stacie Greene is a 45 y.o. 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  For the past day and a half she has noticed that every time she gets up she has been feeling dizzy.  Also has been feeling nauseous.  Denies any vomiting or abdominal pain.  Has also noticed some dark stool in the past day with no bright red blood per rectum.  No weight loss. 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  States she has plans to eventually get home PD in order for dialysis to work better with her work schedule. Her hemoglobin in the ED was found to be 4.4 from a baseline of around 9.  Denies any EPO shots with her dialysis.  States she is never had blood transfusion. Last aspirin and brilinta dose yesterday.Denies NSAIDS or ETOH use.  BP stable.Leukocytosis of 19.4 from chronic leukocytosis baseline of 13-14. FOBT positive. Cr stable at 10.66 which is around her baseline.   ED physician Dr. Alvino Chapel discussed with Dr. Johnney Ou from nephrology who will see her in consultation at [facility] and no emergent plans for dialysis at this time. Hospitalist called for admission.  Review of Systems: Constitutional: No Weight Change, No Fever ENT/Mouth: No sore throat, No Rhinorrhea Eyes: No Eye Pain, No Vision Changes Cardiovascular: No Chest Pain, no SOB Respiratory: No Cough, No Sputum Gastrointestinal: + Nausea, No Vomiting, No Diarrhea, No Constipation, No Pain Genitourinary: no Urinary Incontinence, No Urgency,  No Flank Pain Musculoskeletal: No Arthralgias, No Myalgias Skin: No Skin Lesions, No Pruritus, Neuro: no Weakness, No Numbness,  No Loss of Consciousness, No Syncope Psych: No Anxiety/Panic, No Depression, no decrease appetite Heme/Lymph: No Bruising, No Bleeding  Past Medical History:  Diagnosis Date   CKD (chronic kidney disease), stage IV (HCC)    Hypertension     Past Surgical History:  Procedure Laterality Date   APPENDECTOMY     CORONARY STENT INTERVENTION N/A 03/29/2020   Procedure: CORONARY STENT INTERVENTION;  Surgeon: Burnell Blanks, MD;  Location: [place] [facility];  Service: Cardiovascular;  Laterality: N/A;   INTRAVASCULAR ULTRASOUND/IVUS N/A 03/29/2020   Procedure: Intravascular Ultrasound/IVUS;  Surgeon: Burnell Blanks, MD;  Location: [place] [facility];  Service: Cardiovascular;  Laterality: N/A;   LEFT HEART CATH AND CORONARY ANGIOGRAPHY N/A 03/29/2020   Procedure: LEFT HEART CATH AND CORONARY ANGIOGRAPHY;  Surgeon: Burnell Blanks, MD;  Location: [place] [facility];  Service: Cardiovascular;  Laterality: N/A;   THYROID SURGERY     Works as a Tour manager  reports that she has quit smoking. She has never used smokeless tobacco. She reports previous alcohol use. She reports that she does not use drugs. Social History  No Known Allergies  No family history of GI disorders or cancer.   Prior to Admission medications   Medication Sig Start Date End Date Taking? Authorizing Provider  aspirin 81 MG EC tablet TAKE 1 TABLET (81 MG TOTAL) BY MOUTH DAILY. SWALLOW  WHOLE. 04/03/20 04/03/21 Yes Bonnielee Haff, MD  atorvastatin (LIPITOR) 40 MG tablet Take 1 tablet (40 mg total) by mouth daily at 6 PM. 04/10/20  Yes Kilroy, Doreene Burke, PA-C  calcitRIOL (ROCALTROL) 0.25 MCG capsule Take 0.25 mcg by mouth every Monday, Wednesday, and Friday. 02/26/20  Yes [provider]  carvedilol (COREG) 6.25 MG tablet Take 1 tablet (6.25 mg total) by mouth 2  (two) times daily with a meal. 04/10/20  Yes Kilroy, Luke K, PA-C  FEROSUL 325 (65 Fe) MG tablet Take 325 mg by mouth daily. 01/15/20  Yes [provider]  lidocaine-prilocaine (EMLA) cream APPLY SMALL AMOUNT TO ACCESS SITE (AVF) 1 HOUR BEFORE DIALYSIS. COVER WITH OCCLUSIVE DRESSING (SARAN WRAP) 05/24/20  Yes [provider]  ticagrelor (BRILINTA) 90 MG TABS tablet Take 1 tablet (90 mg total) by mouth 2 (two) times daily. 04/10/20  Yes Kilroy, Luke K, PA-C  VITAMIN D PO Take 1 tablet by mouth daily.   Yes [provider]  nitroGLYCERIN (NITROSTAT) 0.4 MG SL tablet Place 1 tablet (0.4 mg total) under the tongue every 5 (five) minutes as needed for chest pain. Patient not taking: Reported on 10/16/2020 04/10/20 10/16/20  Erlene Quan, PA-C  sodium bicarbonate 650 MG tablet Take 650 mg by mouth 2 (two) times daily. Patient not taking: Reported on 10/16/2020 11/24/19   [provider]    Physical Exam: Vitals:   10/16/20 1451 10/16/20 1835 10/16/20 1905  BP: 122/69 113/63 (!) 133/57  Pulse: (!) 112 98 (!) 107  Resp: '16 18 18  '$ Temp: 98.3 F (36.8 C)    TempSrc: Oral    SpO2: 100% 100% 100%  Weight:   70.3 kg  Height:   '5\' 7"'$  (1.702 m)    Constitutional: NAD, calm, comfortable, young female laying flat on right side in bed  Vitals:   10/16/20 1451 10/16/20 1835 10/16/20 1905  BP: 122/69 113/63 (!) 133/57  Pulse: (!) 112 98 (!) 107  Resp: '16 18 18  '$ Temp: 98.3 F (36.8 C)    TempSrc: Oral    SpO2: 100% 100% 100%  Weight:   70.3 kg  Height:   '5\' 7"'$  (1.702 m)   Eyes: PERRL, lids and conjunctivae normal ENMT: Mucous membranes are moist.  Neck: normal, supple Respiratory: clear to auscultation bilaterally, no wheezing, no crackles. Normal respiratory effort. No accessory muscle use.  Cardiovascular: Regular rate and rhythm, no murmurs / rubs / gallops. No extremity edema.  Abdomen: no tenderness, no masses palpated. . Bowel sounds positive.   Musculoskeletal: no clubbing / cyanosis. No joint deformity upper and lower extremities. Good ROM, no contractures. Normal muscle tone. AV fistula on left wrist.  Skin: no rashes, lesions, ulcers. No induration Neurologic: CN 2-12 grossly intact. Sensation intact, Strength 5/5 in all 4.  Psychiatric: Normal judgment and insight. Alert and oriented x 3. Normal mood.     Labs on Admission: I have personally reviewed following labs and imaging studies  CBC: Recent Labs  Lab 10/16/20 1714  WBC 19.4*  NEUTROABS 15.6*  HGB 4.1*  HCT 13.6*  MCV 94.4  PLT [id number]   Basic Metabolic Panel: Recent Labs  Lab 10/16/20 1714  NA 137  K 5.1  CL 113*  CO2 12*  GLUCOSE 127*  BUN 118*  CREATININE 10.66*  CALCIUM 8.6*   GFR: Estimated Creatinine Clearance: 6.5 mL/min (A) (by C-G formula based on SCr of 10.66 mg/dL (H)). Liver Function Tests: Recent Labs  Lab 10/16/20 1714  AST 11*  ALT 18  ALKPHOS 52  BILITOT 0.5  PROT 7.0  ALBUMIN 3.9   Recent Labs  Lab 10/16/20 1714  LIPASE 47   No results for input(s): AMMONIA in the last 168 hours. Coagulation Profile: No results for input(s): INR, PROTIME in the last 168 hours. Cardiac Enzymes: No results for input(s): CKTOTAL, CKMB, CKMBINDEX, TROPONINI in the last 168 hours. BNP (last 3 results) No results for input(s): PROBNP in the last 8760 hours. HbA1C: No results for input(s): HGBA1C in the last 72 hours. CBG: No results for input(s): GLUCAP in the last 168 hours. Lipid Profile: No results for input(s): CHOL, HDL, LDLCALC, TRIG, CHOLHDL, LDLDIRECT in the last 72 hours. Thyroid Function Tests: No results for input(s): TSH, T4TOTAL, FREET4, T3FREE, THYROIDAB in the last 72 hours. Anemia Panel: Recent Labs    10/16/20 1926 10/16/20 2155  VITAMINB12  --  138*  FOLATE 6.1  --   FERRITIN  --  412*  TIBC  --  322  IRON  --  84  RETICCTPCT  --  11.1*   Urine analysis: No results found for: COLORURINE, APPEARANCEUR,  LABSPEC, PHURINE, GLUCOSEU, HGBUR, BILIRUBINUR, KETONESUR, PROTEINUR, UROBILINOGEN, NITRITE, LEUKOCYTESUR  Radiological Exams on Admission: DG Chest 1 View  Result Date: 10/16/2020 CLINICAL DATA:  45 year old female with abdominal pain. EXAM: CHEST  1 VIEW COMPARISON:  Chest radiograph dated 03/27/2020 FINDINGS: Right-sided dialysis catheter with tip at the cavoatrial junction. No focal consolidation, pleural effusion, or pneumothorax. The cardiac silhouette is within limits. Coronary vascular stent. No acute osseous pathology. IMPRESSION: No active cardiopulmonary disease. Electronically Signed   By: Anner Crete M.D.   On: 10/16/2020 16:08      Assessment/Plan  Acute on chronic blood loss anemia -Hgb of 4.4 (baseline around 9), +FOBT , transfuse 1 unit pRBC and follow CBC -Threshold for transfuse at Hgb <7  -NPO -Will need GI consult in the morning -daily IV PPI  FSGS with ESRD on HD MWF -pt has missed HD for 2 weeks due to work schedule but Cr remains stable at baseline around 44 -Nephrology Dr. Johnney Ou aware and will see in consultation at [facility]  Hx of CAD s/p DES to LAD 03/29/2020 -hold aspirin and Brilinta due to acute GI bleed -Will need to discuss with cardiology in the morning regarding whether or when to resume DAPT  Leukocytosis -likely reactive to anemia and is chronic   HLD -continue statin   Hypertension -hold Coreg for now while hypovolemic   DVT prophylaxis: SCDs Code Status: Full Family Communication: Plan discussed with patient at bedside  disposition Plan: Home with at least 2 midnight stays  Consults called:  Admission status: inpatient    Level of care: Progressive  Status is: Inpatient  Remains inpatient appropriate because:Inpatient level of care appropriate due to severity of illness  Dispo: The patient is from: Home              Anticipated d/c is to: Home              Patient currently is not medically stable to d/c.   Difficult to  place patient No         Orene Desanctis DO [facility]   If 7PM-7AM, please contact night-coverage [email or web address]   10/16/2020, 11:44 PM

## 2020-10-16 NOTE — ED Notes (Signed)
Consult to Nephrology'@19'$ :45pm.

## 2020-10-16 NOTE — ED Provider Notes (Signed)
Emergency Medicine Provider Triage Evaluation Note  Stacie Greene , a 45 y.o. female  was evaluated in triage.  Pt complains of diffuse abdominal pain, dizziness, weakness.  Patient states she has not been able to go to dialysis for 2 weeks due to her work schedule.  Denies any vomiting, does endorse a mild nausea.  She also endorses dizziness when she moves.  Denies any cough, fevers or chills.  Review of Systems  Positive: As above Negative: As above  Physical Exam  BP 122/69 (BP Location: Right Arm)   Pulse (!) 112   Temp 98.3 F (36.8 C) (Oral)   Resp 16   SpO2 100%  Gen:   Awake, no distress   Resp:  Normal effort  MSK:   Moves extremities without difficulty Other:  Soft  nontender abdomen  Medical Decision Making  Medically screening exam initiated at 3:22 PM.  Appropriate orders placed.  Stacie Greene was informed that the remainder of the evaluation will be completed by another provider, this initial triage assessment does not replace that evaluation, and the importance of remaining in the ED until their evaluation is complete.     Lyndel Safe 10/16/20 1522    Luna Fuse, MD 10/25/20 [phone]

## 2020-10-17 DIAGNOSIS — D72829 Elevated white blood cell count, unspecified: Secondary | ICD-10-CM

## 2020-10-17 LAB — BASIC METABOLIC PANEL
Anion gap: 12 (ref 5–15)
BUN: 139 mg/dL — ABNORMAL HIGH (ref 6–20)
CO2: 13 mmol/L — ABNORMAL LOW (ref 22–32)
Calcium: 8.1 mg/dL — ABNORMAL LOW (ref 8.9–10.3)
Chloride: 115 mmol/L — ABNORMAL HIGH (ref 98–111)
Creatinine, Ser: 10.77 mg/dL — ABNORMAL HIGH (ref 0.44–1.00)
GFR, Estimated: 4 mL/min — ABNORMAL LOW (ref >60)
Glucose, Bld: 100 mg/dL — ABNORMAL HIGH (ref 70–99)
Potassium: 5.5 mmol/L — ABNORMAL HIGH (ref 3.5–5.1)
Sodium: 140 mmol/L (ref 135–145)

## 2020-10-17 LAB — PREPARE RBC (CROSSMATCH)

## 2020-10-17 LAB — URINALYSIS, ROUTINE W REFLEX MICROSCOPIC
Bilirubin Urine: NEGATIVE
Glucose, UA: NEGATIVE mg/dL
Ketones, ur: NEGATIVE mg/dL
Nitrite: NEGATIVE
Protein, ur: 30 mg/dL — AB
Specific Gravity, Urine: 1.01 (ref 1.005–1.030)
pH: 6 (ref 5.0–8.0)

## 2020-10-17 LAB — CBC
HCT: 15.4 % — ABNORMAL LOW (ref 36.0–46.0)
HCT: 20.3 % — ABNORMAL LOW (ref 36.0–46.0)
Hemoglobin: 4.9 g/dL — CL (ref 12.0–15.0)
Hemoglobin: 7.1 g/dL — ABNORMAL LOW (ref 12.0–15.0)
MCH: 29 pg (ref 26.0–34.0)
MCH: 30.2 pg (ref 26.0–34.0)
MCHC: 31.8 g/dL (ref 30.0–36.0)
MCHC: 35 g/dL (ref 30.0–36.0)
MCV: 91.1 fL (ref 80.0–100.0)
MCV: 86.4 fL (ref 80.0–100.0)
Platelets: 238 10*3/uL (ref 150–400)
Platelets: 254 10*3/uL (ref 150–400)
RBC: 1.69 MIL/uL — ABNORMAL LOW (ref 3.87–5.11)
RBC: 2.35 MIL/uL — ABNORMAL LOW (ref 3.87–5.11)
RDW: 16.2 % — ABNORMAL HIGH (ref 11.5–15.5)
RDW: 15.2 % (ref 11.5–15.5)
WBC: 19.7 10*3/uL — ABNORMAL HIGH (ref 4.0–10.5)
WBC: 19.7 10*3/uL — ABNORMAL HIGH (ref 4.0–10.5)
nRBC: 0.5 % — ABNORMAL HIGH (ref 0.0–0.2)
nRBC: 0.7 % — ABNORMAL HIGH (ref 0.0–0.2)

## 2020-10-17 LAB — ABO/RH: ABO/RH(D): A POS

## 2020-10-17 LAB — RESP PANEL BY RT-PCR (FLU A&B, COVID) ARPGX2
Influenza A by PCR: NEGATIVE
Influenza B by PCR: NEGATIVE
SARS Coronavirus 2 by RT PCR: NEGATIVE

## 2020-10-17 MED ORDER — SODIUM CHLORIDE 0.9% IV SOLUTION: Freq: Once | INTRAVENOUS | Status: DC

## 2020-10-17 MED ORDER — ALTEPLASE 2 MG IJ SOLR: 2.0000 mg | Freq: Once | INTRAMUSCULAR | Status: DC | PRN

## 2020-10-17 MED ORDER — PEG-KCL-NACL-NASULF-NA ASC-C 100 G PO SOLR: 1.0000 | Freq: Once | ORAL | Status: DC

## 2020-10-17 MED ORDER — LIDOCAINE-PRILOCAINE 2.5-2.5 % EX CREA: 1.0000 "application " | TOPICAL_CREAM | CUTANEOUS | Status: DC | PRN

## 2020-10-17 MED ORDER — PENTAFLUOROPROP-TETRAFLUOROETH EX AERO: 1.0000 "application " | INHALATION_SPRAY | CUTANEOUS | Status: DC | PRN

## 2020-10-17 MED ORDER — PEG-KCL-NACL-NASULF-NA ASC-C 100 G PO SOLR: 0.5000 | Freq: Once | ORAL | Status: DC

## 2020-10-17 MED ORDER — CARVEDILOL 6.25 MG PO TABS
6.2500 mg | ORAL_TABLET | Freq: Two times a day (BID) | ORAL | Status: DC
  Administered 2020-10-17 – 2020-10-20 (×6): 6.25 mg via ORAL
  Filled 2020-10-17: qty 2
  Filled 2020-10-17 (×5): qty 1

## 2020-10-17 MED ORDER — ATORVASTATIN CALCIUM 40 MG PO TABS
40.0000 mg | ORAL_TABLET | Freq: Every day | ORAL | Status: DC
  Administered 2020-10-17 – 2020-10-19 (×4): 40 mg via ORAL
  Filled 2020-10-17 (×3): qty 1

## 2020-10-17 MED ORDER — LIDOCAINE HCL (PF) 1 % IJ SOLN: 5.0000 mL | INTRAMUSCULAR | Status: DC | PRN

## 2020-10-17 MED ORDER — PANTOPRAZOLE SODIUM 40 MG IV SOLR
40.0000 mg | Freq: Two times a day (BID) | INTRAVENOUS | Status: DC
  Administered 2020-10-17 – 2020-10-19 (×6): 40 mg via INTRAVENOUS
  Filled 2020-10-17 (×7): qty 40

## 2020-10-17 MED ORDER — SODIUM CHLORIDE 0.9 % IV SOLN: 100.0000 mL | INTRAVENOUS | Status: DC | PRN

## 2020-10-17 MED ORDER — HEPARIN SODIUM (PORCINE) 1000 UNIT/ML DIALYSIS: 1000.0000 [IU] | INTRAMUSCULAR | Status: DC | PRN

## 2020-10-17 MED ORDER — PEG-KCL-NACL-NASULF-NA ASC-C 100 G PO SOLR
0.5000 | Freq: Once | ORAL | Status: AC
  Administered 2020-10-18: 100 g via ORAL
  Filled 2020-10-17 (×2): qty 1

## 2020-10-17 MED ORDER — CHLORHEXIDINE GLUCONATE CLOTH 2 % EX PADS
6.0000 | MEDICATED_PAD | Freq: Every day | CUTANEOUS | Status: DC
  Administered 2020-10-18 – 2020-10-20 (×3): 6 via TOPICAL

## 2020-10-17 MED ORDER — DARBEPOETIN ALFA 200 MCG/0.4ML IJ SOSY: 200.0000 ug | PREFILLED_SYRINGE | INTRAMUSCULAR | Status: DC

## 2020-10-17 NOTE — ED Notes (Signed)
Attempted report. Nurse and charge unavailable. On hold 10+ minutes.

## 2020-10-17 NOTE — ED Notes (Signed)
Pt given bedpan, put herself on and took herself off the bedpan. Pt now resting comfortably.

## 2020-10-17 NOTE — Consult Note (Addendum)
Renal Service Consult Note [facility]  Stacie Greene 10/17/2020 [name], MD Requesting Physician: Dr. Doristine Bosworth  Reason for Consult: ESRD pt w/ nausea and missed HD HPI: The patient is a 45 y.o. year-old w/ hx of ESRD on HD, HTN, CAD sp PCI presented to ED w/ c/o abd pain and nausea and dizziness. Also has had dark stools for a couple of days. Has missed total 10 HD treatments (last tx 09/23/20)-she reports this is d/t work.  In ED Hb was 4.4, last was 9. No hx tranfusion. Taking brilinta and asa. WBC 19K, FOBT +. Cr 10.6.  Pt was admitted for severe anemia. Asked to see for ESRD.   Pt w hx of FSGS, on HD MWF. As above has missed a lot of HD due to work. Pt hoping for PD eventually and also renal transplant.   ROS - denies CP, no joint pain, no HA, no blurry vision, no rash, no diarrhea, no nausea/ vomiting, no dysuria, no difficulty voiding   Past Medical History  Past Medical History:  Diagnosis Date   CKD (chronic kidney disease), stage IV ([place])    Hypertension    Past Surgical History  Past Surgical History:  Procedure Laterality Date   APPENDECTOMY     CORONARY STENT INTERVENTION N/A 03/29/2020   Procedure: CORONARY STENT INTERVENTION;  Surgeon: Burnell Blanks, MD;  Location: [place] [facility];  Service: Cardiovascular;  Laterality: N/A;   INTRAVASCULAR ULTRASOUND/IVUS N/A 03/29/2020   Procedure: Intravascular Ultrasound/IVUS;  Surgeon: Burnell Blanks, MD;  Location: [place] [facility];  Service: Cardiovascular;  Laterality: N/A;   LEFT HEART CATH AND CORONARY ANGIOGRAPHY N/A 03/29/2020   Procedure: LEFT HEART CATH AND CORONARY ANGIOGRAPHY;  Surgeon: Burnell Blanks, MD;  Location: [place] [facility];  Service: Cardiovascular;  Laterality: N/A;   THYROID SURGERY     Family History History reviewed. No pertinent family history. Social History  reports that she has quit smoking. She has never used smokeless tobacco. She reports  previous alcohol use. She reports that she does not use drugs. Allergies No Known Allergies Home medications Prior to Admission medications   Medication Sig Start Date End Date Taking? Authorizing Provider  aspirin 81 MG EC tablet TAKE 1 TABLET (81 MG TOTAL) BY MOUTH DAILY. SWALLOW WHOLE. 04/03/20 04/03/21 Yes Bonnielee Haff, MD  atorvastatin (LIPITOR) 40 MG tablet Take 1 tablet (40 mg total) by mouth daily at 6 PM. 04/10/20  Yes Kilroy, Doreene Burke, PA-C  calcitRIOL (ROCALTROL) 0.25 MCG capsule Take 0.25 mcg by mouth every Monday, Wednesday, and Friday. 02/26/20  Yes [provider]  carvedilol (COREG) 6.25 MG tablet Take 1 tablet (6.25 mg total) by mouth 2 (two) times daily with a meal. 04/10/20  Yes Kilroy, Luke K, PA-C  FEROSUL 325 (65 Fe) MG tablet Take 325 mg by mouth daily. 01/15/20  Yes [provider]  lidocaine-prilocaine (EMLA) cream APPLY SMALL AMOUNT TO ACCESS SITE (AVF) 1 HOUR BEFORE DIALYSIS. COVER WITH OCCLUSIVE DRESSING (SARAN WRAP) 05/24/20  Yes [provider]  ticagrelor (BRILINTA) 90 MG TABS tablet Take 1 tablet (90 mg total) by mouth 2 (two) times daily. 04/10/20  Yes Kilroy, Luke K, PA-C  VITAMIN D PO Take 1 tablet by mouth daily.   Yes [provider]  nitroGLYCERIN (NITROSTAT) 0.4 MG SL tablet Place 1 tablet (0.4 mg total) under the tongue every 5 (five) minutes as needed for chest pain. Patient not taking: Reported on 10/16/2020 04/10/20 10/16/20  Kerin Ransom K, PA-C  sodium bicarbonate 650 MG tablet Take 650 mg by mouth 2 (two) times daily. Patient not taking: Reported on 10/16/2020 11/24/19   [provider]     Vitals:   10/17/20 1350 10/17/20 1405 10/17/20 1405 10/17/20 1430  BP:  124/68 124/68 122/72  Pulse:  92 92 97  Resp:  '10 12 15  '$ Temp:   99.1 F (37.3 C)   TempSrc:   Oral   SpO2: 100% 100% 100% 100%  Weight:      Height:       Exam Gen Well developed female; NAD No rash or cyanosis   Chest clear anteriorly; No  wheezing, rales, or rhonchi RRR no MRG  Abd soft ntnd no mass or ascites +bs Ext No edema BLLE Neuro is AxO X 3    OP HD:  MWF-[facility] [place] ([place]) 2hrs BFR: 200 DFR: 500 EDW: 71.5kg 3K, 2.5Ca L AVF  Heparin 2,000 unit bolus with HD treatments Hectorol 31mg IV 3x/weekly-last dose 09/23/20 Micera 1573m IV Q2wks-last dose 08/14/20 Previously ordered Venofer '100mg'$  IV X 10 doses at AF but never received since not coming to treatment.   Assessment/ Plan: Acute/ chronic blood loss anemia - b/l Hb around 9-10. Hb 4.4 here w/ gen weakness. Transfused 2u prbc's today. FOBT+. GI consulting.  ESRD - on HD MWF. Has missed total 10 HD txs (last HD 09/23/20)-due to work. B/Cr and K+ are high. Plan HD this evening. HTN - home coreg on hold Volume - BP stable, no signs for fluid volume overload Anemia ckd - see above-last ESA dose given OP in April-will resume here-1st dose to be with HD this evening. 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       RoKelly SplinterMD 10/17/2020, 2:46 PM  Recent Labs  Lab 10/16/20 1714 10/17/20 0820  WBC 19.4* 19.7*  HGB 4.1* 4.9*   Recent Labs  Lab 10/16/20 1714 10/17/20 0820  K 5.1 5.5*  BUN 118* 139*  CREATININE 10.66* 10.77*  CALCIUM 8.6* 8.1*

## 2020-10-17 NOTE — Consult Note (Addendum)
Consultation  Referring Provider: Dr. Doristine Bosworth     Primary Care Physician:  Pcp, No Primary Gastroenterologist:  Althia Forts (being transferred to [facility] this afternoon)       Reason for Consultation: Anemia             HPI:   Stacie Greene is a 45 y.o. female with a past medical history of ESRD on HD Monday Wednesday Friday with planned home PD and on renal transplant list, CAD with DES to proximal to mid LAD on 03/29/2020 on aspirin and Brilinta and hypertension, who presented to the ER on 10/16/2020 with dizziness and nausea.  We are consulted in regards to a finding of hemoglobin 4.4 (baseline around 9) and FOBT positive stool.    Today, the patient explains that she had been getting dizzy and anytime that she went to stand up as well as nauseous over the past 2 to 3 days.  Describes seeing some "dark/black stool" over the past 2 days, just once a day.  Denies any heartburn, reflux, abdominal pain or similar previous episodes.    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    Denies NSAID, fever, chills or shortness of breath.  ED course: Hemoglobin 4.4 from baseline of around 9, last aspirin and Brilinta dose on 6/28  GI history: None  Past Medical History:  Diagnosis Date  . CKD (chronic kidney disease), stage IV ([employer])   . Hypertension     Past Surgical History:  Procedure Laterality Date  . APPENDECTOMY    . CORONARY STENT INTERVENTION N/A 03/29/2020   Procedure: CORONARY STENT INTERVENTION;  Surgeon: Burnell Blanks, MD;  Location: [place] [facility];  Service: Cardiovascular;  Laterality: N/A;  . INTRAVASCULAR ULTRASOUND/IVUS N/A 03/29/2020   Procedure: Intravascular Ultrasound/IVUS;  Surgeon: Burnell Blanks, MD;  Location: [place] [facility];  Service: Cardiovascular;  Laterality: N/A;  . LEFT HEART CATH AND CORONARY  ANGIOGRAPHY N/A 03/29/2020   Procedure: LEFT HEART CATH AND CORONARY ANGIOGRAPHY;  Surgeon: Burnell Blanks, MD;  Location: [place] [facility];  Service: Cardiovascular;  Laterality: N/A;  . THYROID SURGERY      Family history: No family history of GI cancers  Social History   Tobacco Use  . Smoking status: Former    Pack years: 0.00  . Smokeless tobacco: Never  Vaping Use  . Vaping Use: Never used  Substance Use Topics  . Alcohol use: Not Currently  . Drug use: Never    Prior to Admission medications   Medication Sig Start Date End Date Taking? Authorizing Provider  aspirin 81 MG EC tablet TAKE 1 TABLET (81 MG TOTAL) BY MOUTH DAILY. SWALLOW WHOLE. 04/03/20 04/03/21 Yes Bonnielee Haff, MD  atorvastatin (LIPITOR) 40 MG tablet Take 1 tablet (40 mg total) by mouth daily at 6 PM. 04/10/20  Yes Kilroy, Doreene Burke, PA-C  calcitRIOL (ROCALTROL) 0.25 MCG capsule Take 0.25 mcg by mouth every Monday, Wednesday, and Friday. 02/26/20  Yes [provider]  carvedilol (COREG) 6.25 MG tablet Take 1 tablet (6.25 mg total) by mouth 2 (two) times daily with a meal. 04/10/20  Yes Kilroy, Luke K, PA-C  FEROSUL 325 (65 Fe) MG tablet Take 325 mg by mouth daily. 01/15/20  Yes [provider]  lidocaine-prilocaine (EMLA) cream APPLY SMALL AMOUNT TO ACCESS SITE (  AVF) 1 HOUR BEFORE DIALYSIS. COVER WITH OCCLUSIVE DRESSING (SARAN WRAP) 05/24/20  Yes [provider]  ticagrelor (BRILINTA) 90 MG TABS tablet Take 1 tablet (90 mg total) by mouth 2 (two) times daily. 04/10/20  Yes Kilroy, Luke K, PA-C  VITAMIN D PO Take 1 tablet by mouth daily.   Yes [provider]  nitroGLYCERIN (NITROSTAT) 0.4 MG SL tablet Place 1 tablet (0.4 mg total) under the tongue every 5 (five) minutes as needed for chest pain. Patient not taking: Reported on 10/16/2020 04/10/20 10/16/20  Erlene Quan, PA-C  sodium bicarbonate 650 MG tablet Take 650 mg by mouth 2 (two) times daily. Patient not taking:  Reported on 10/16/2020 11/24/19   [provider]    Current Facility-Administered Medications  Medication Dose Route Frequency Provider Last Rate Last Admin  . atorvastatin (LIPITOR) tablet 40 mg  40 mg Oral q1800 Tu, Ching T, DO      . ondansetron (ZOFRAN) injection 4 mg  4 mg Intravenous Q6H PRN Tu, Ching T, DO   4 mg at 10/17/20 0111  . pantoprazole (PROTONIX) injection 40 mg  40 mg Intravenous QHS Tu, Ching T, DO       Current Outpatient Medications  Medication Sig Dispense Refill  . aspirin 81 MG EC tablet TAKE 1 TABLET (81 MG TOTAL) BY MOUTH DAILY. SWALLOW WHOLE. 30 tablet 11  . atorvastatin (LIPITOR) 40 MG tablet Take 1 tablet (40 mg total) by mouth daily at 6 PM. 90 tablet 3  . calcitRIOL (ROCALTROL) 0.25 MCG capsule Take 0.25 mcg by mouth every Monday, Wednesday, and Friday.    . carvedilol (COREG) 6.25 MG tablet Take 1 tablet (6.25 mg total) by mouth 2 (two) times daily with a meal. 180 tablet 3  . FEROSUL 325 (65 Fe) MG tablet Take 325 mg by mouth daily.    Marland Kitchen lidocaine-prilocaine (EMLA) cream APPLY SMALL AMOUNT TO ACCESS SITE (AVF) 1 HOUR BEFORE DIALYSIS. COVER WITH OCCLUSIVE DRESSING (SARAN WRAP)    . ticagrelor (BRILINTA) 90 MG TABS tablet Take 1 tablet (90 mg total) by mouth 2 (two) times daily. 180 tablet 3  . VITAMIN D PO Take 1 tablet by mouth daily.    . nitroGLYCERIN (NITROSTAT) 0.4 MG SL tablet Place 1 tablet (0.4 mg total) under the tongue every 5 (five) minutes as needed for chest pain. (Patient not taking: Reported on 10/16/2020) 25 tablet 3  . sodium bicarbonate 650 MG tablet Take 650 mg by mouth 2 (two) times daily. (Patient not taking: Reported on 10/16/2020)      Allergies as of 10/16/2020  . (No Known Allergies)     Review of Systems:    Constitutional: No weight loss, fever or chills Skin: No rash  Cardiovascular: No chest pain Respiratory: No SOB  Gastrointestinal: See HPI and otherwise negative Genitourinary: No dysuria  Neurological:  +dizziness Musculoskeletal: No new muscle or joint pain Hematologic: No bruising Psychiatric: No history of depression or anxiety    Physical Exam:  Vital signs in last 24 hours: Temp:  [98.2 F (36.8 C)-98.3 F (36.8 C)] 98.2 F (36.8 C) (06/30 0427) Pulse Rate:  [97-112] 97 (06/30 0800) Resp:  [16-20] 17 (06/30 0800) BP: (113-133)/(57-77) 123/70 (06/30 0800) SpO2:  [100 %] 100 % (06/30 0800) Weight:  [70.3 kg] 70.3 kg (06/29 1905)   General:   Pleasant AA female appears to be in NAD, Well developed, Well nourished, alert and cooperative Head:  Normocephalic and atraumatic. Eyes:   PEERL, EOMI. No  icterus. Conjunctiva pink. Ears:  Normal auditory acuity. Neck:  Supple Throat: Oral cavity and pharynx without inflammation, swelling or lesion.  Lungs: Respirations even and unlabored. Lungs clear to auscultation bilaterally.   No wheezes, crackles, or rhonchi.  Heart: Normal S1, S2. No MRG. Regular rate and rhythm. No peripheral edema, cyanosis or pallor.  Abdomen:  Soft, nondistended, nontender. No rebound or guarding. Normal bowel sounds. No appreciable masses or hepatomegaly. Rectal:  Not performed. (FOBT + per ED physician) Msk:  Symmetrical without gross deformities. Peripheral pulses intact.  Extremities:  Without edema, no deformity or joint abnormality. Neurologic:  Alert and  oriented x4;  grossly normal neurologically.  Skin:   Dry and intact without significant lesions or rashes. Psychiatric: Demonstrates good judgement and reason without abnormal affect or behaviors.   LAB RESULTS: Recent Labs    10/16/20 1714 10/17/20 0820  WBC 19.4* 19.7*  HGB 4.1* 4.9*  HCT 13.6* 15.4*  PLT 355 238   BMET Recent Labs    10/16/20 1714 10/17/20 0820  NA 137 140  K 5.1 5.5*  CL 113* 115*  CO2 12* 13*  GLUCOSE 127* 100*  BUN 118* 139*  CREATININE 10.66* 10.77*  CALCIUM 8.6* 8.1*   LFT Recent Labs    10/16/20 1714  PROT 7.0  ALBUMIN 3.9  AST 11*  ALT 18   ALKPHOS 52  BILITOT 0.5   STUDIES: DG Chest 1 View  Result Date: 10/16/2020 CLINICAL DATA:  45 year old female with abdominal pain. EXAM: CHEST  1 VIEW COMPARISON:  Chest radiograph dated 03/27/2020 FINDINGS: Right-sided dialysis catheter with tip at the cavoatrial junction. No focal consolidation, pleural effusion, or pneumothorax. The cardiac silhouette is within limits. Coronary vascular stent. No acute osseous pathology. IMPRESSION: No active cardiopulmonary disease. Electronically Signed   By: Anner Crete M.D.   On: 10/16/2020 16:08     Impression / Plan:   Impression: 1.  Acute on chronic blood loss anemia: Hemoglobin 4.4 (baseline around 9), FOBT positive, transfused 1 unit PRBCs, patient reports seeing black stools; consider upper GI bleed versus other 2.  ESRD on HD Monday Wednesday Friday: Has missed HD for 2 weeks due to work schedule, patient being transferred to [facility] for nephrology consult 3.  History of CAD status post DES to LAD 03/29/2020: On aspirin and Brilinta, last dose 10/15/2020  Plan: 1.  Patient is now status post 1 PRBCs, she will need recheck of hemoglobin and continue transfusion so that her hemoglobin can remain above 7 in order to have an EGD and colonoscopy tomorrow. 2.  Remain on clear liquid diet today and n.p.o. at midnight. 3.  Start movie prep this afternoon around 4 PM and split dose fashion. 4.  Patient is scheduled for an EGD and colonoscopy tomorrow with Dr. Silverio Decamp assuming that her hemoglobin is at an acceptable level.  Did discuss risks, benefits, limitations and alternatives and patient agrees to proceed. 5.  Agree with Protonix twice daily 6.  Please await any further recommendations later today from the physician on call.  Thank you for your kind consultation, we will continue to follow.  Lavone Nian [facility]  10/17/2020, 9:23 AM   ________________________________________________________________________  Velora Heckler GI MD note:  I  personally examined the patient, reviewed the data and agree with the assessment and plan described above.  Seems most likely an UGI bleed in setting of ASA and brilinta for CAD/stenting (ddx PUD, MW tear, severe gastritis, esophagitis, AVM).  Her Hb nadir was 4.4, I suspect she  will need 3-4 units blood to get Hb >7.  I changed her 2unit PRBC order to 3 units just now.  Hopefully can transfuse to get her >7 to allow safe EGD/colonoscopy tomorrow. PPI IV BID for now.  Prep has been ordered. She is transferring to [facility] soon for HD.    Owens Loffler, MD [facility] Gastroenterology Pager [phone]

## 2020-10-17 NOTE — Progress Notes (Addendum)
Arrived at 04:06 pm via [facility] with blood transfusion running.  report of the following:  [id number] blood started  1404 temp temp in ED 1540 98.5 temp by [facility]  STAT type and screen ordered.  Patient A&O.  CHG bath completed.  Vital signs per protocol.  HD cath located on Right chest HD cath noted with no dressing.  Dressing applied at this time

## 2020-10-17 NOTE — ED Notes (Signed)
[facility] called for transport 

## 2020-10-17 NOTE — ED Notes (Addendum)
Attempted report. Nurse informed charge had not looked at pt to receive. Requested call back.

## 2020-10-17 NOTE — ED Notes (Addendum)
Attempted to call report, floor said they would call back in 5m

## 2020-10-17 NOTE — Progress Notes (Signed)
PROGRESS NOTE    Stacie Greene  TMH:[id number] DOB: 03/13/1976 DOA: 10/16/2020 PCP: Pcp, No   Brief Narrative:  HPI: Stacie Greene is a 45 y.o. 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   For the past day and a half she has noticed that every time she gets up she has been feeling dizzy.  Also has been feeling nauseous.  Denies any vomiting or abdominal pain.  Has also noticed some dark stool in the past day with no bright red blood per rectum.  No weight loss. 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  States she has plans to eventually get home PD in order for dialysis to work better with her work schedule. Her hemoglobin in the ED was found to be 4.4 from a baseline of around 9.  Denies any EPO shots with her dialysis.  States she is never had blood transfusion. Last aspirin and brilinta dose yesterday.Denies NSAIDS or ETOH use.   BP stable.Leukocytosis of 19.4 from chronic leukocytosis baseline of 13-14. FOBT positive. Cr stable at 10.66 which is around her baseline.   ED physician Dr. Alvino Chapel discussed with Dr. Johnney Ou from nephrology who will see her in consultation at [facility] and no emergent plans for dialysis at this time. Hospitalist called for admission.  Assessment & Plan:   Principal Problem:   Acute blood loss anemia Active Problems:   ESRD (end stage renal disease) (HCC)   CAD S/P percutaneous coronary angioplasty   Essential hypertension   Dyslipidemia, goal LDL below 70   Leukocytosis  Acute blood loss anemia on anemia of chronic disease: Has history of anemia of chronic disease due to ESRD and her baseline hemoglobin is around 10.  Presented with 4.1.  With history of melena for couple of days.  Likely upper  GI bleed.  Received 1 unit of PRBC only.  Hemoglobin 4.9.  Consulted GI this morning and she was seen by GI but they prefer to have hemoglobin at least 7 before they do any scopes.  They have scheduled her for EGD and colonoscopy tomorrow morning as long as her her hemoglobin is over 7.  I will transfuse 2 units of PRBC today and recheck H&H posttransfusion.  Continue Protonix but increase frequency to twice daily.  Appreciate GI help.  Continue to hold aspirin and Brilinta.  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 -Nephrology Dr. Johnney Ou aware and will see in consultation at [facility]   Hx of CAD s/p DES to LAD 03/29/2020 -hold aspirin and Brilinta due to acute GI bleed -Might need to discuss with cardiology post procedures and depends on the finding regarding whether or when to resume DAPT   Leukocytosis, chronic: Fairly stable.  No signs of infection.   HLD -continue statin   Hypertension: Blood pressure is stable but she has mild tachycardia.  Resume Coreg.   DVT prophylaxis: SCDs Start: 10/16/20 2220   Code Status: Full Code  Family Communication: None present at bedside.  Plan of care discussed with patient in length and he verbalized understanding and agreed with it.  Status is: Inpatient  Remains inpatient  appropriate because:Ongoing diagnostic testing needed not appropriate for outpatient work up  Dispo: The patient is from: Home              Anticipated d/c is to: Home              Patient currently is not medically stable to d/c.   Difficult to place patient No        Estimated body mass index is 24.28 kg/m as calculated from the following:   Height as of this encounter: $RemoveBeforeD'5\' 7"'tyBhTOxSyAzsCT$  (1.702 m).   Weight as of this encounter: 70.3 kg.      Nutritional status:               Consultants:  GI and nephrology  Procedures:  None  Antimicrobials:  Anti-infectives (From admission, onward)     None          Subjective: Seen and examined.  She has no complaints today.  Objective: Vitals:   10/17/20 0117 10/17/20 0120 10/17/20 0427 10/17/20 0800  BP: 131/77 131/77 124/61 123/70  Pulse: (!) 108 (!) 108 (!) 103 97  Resp: $Remo'20 18 16 17  'RpEuI$ Temp: 98.3 F (36.8 C) 98.3 F (36.8 C) 98.2 F (36.8 C)   TempSrc: Oral Oral Oral   SpO2: 100%  100% 100%  Weight:      Height:        Intake/Output Summary (Last 24 hours) at 10/17/2020 1026 Last data filed at 10/17/2020 0120 Gross per 24 hour  Intake 315 ml  Output --  Net 315 ml   Filed Weights   10/16/20 1905  Weight: 70.3 kg    Examination:  General exam: Appears calm and comfortable  Respiratory system: Clear to auscultation. Respiratory effort normal. Cardiovascular system: S1 & S2 heard, RRR. No JVD, murmurs, rubs, gallops or clicks. No pedal edema. Gastrointestinal system: Abdomen is nondistended, soft and nontender. No organomegaly or masses felt. Normal bowel sounds heard. Central nervous system: Alert and oriented. No focal neurological deficits. Extremities: Symmetric 5 x 5 power. Skin: No rashes, lesions or ulcers Psychiatry: Judgement and insight appear normal. Mood & affect appropriate.    Data Reviewed: I have personally reviewed following labs and imaging studies  CBC: Recent Labs  Lab 10/16/20 1714 10/17/20 0820  WBC 19.4* 19.7*  NEUTROABS 15.6*  --   HGB 4.1* 4.9*  HCT 13.6* 15.4*  MCV 94.4 91.1  PLT 355 637   Basic Metabolic Panel: Recent Labs  Lab 10/16/20 1714 10/17/20 0820  NA 137 140  K 5.1 5.5*  CL 113* 115*  CO2 12* 13*  GLUCOSE 127* 100*  BUN 118* 139*  CREATININE 10.66* 10.77*  CALCIUM 8.6* 8.1*   GFR: Estimated Creatinine Clearance: 6.5 mL/min (A) (by C-G formula based on SCr of 10.77 mg/dL (H)). Liver Function Tests: Recent Labs  Lab 10/16/20 1714  AST 11*  ALT 18  ALKPHOS 52  BILITOT 0.5  PROT 7.0  ALBUMIN 3.9   Recent Labs  Lab 10/16/20 1714  LIPASE  47   No results for input(s): AMMONIA in the last 168 hours. Coagulation Profile: No results for input(s): INR, PROTIME in the last 168 hours. Cardiac Enzymes: No results for input(s): CKTOTAL, CKMB, CKMBINDEX, TROPONINI in the last 168 hours. BNP (last 3 results) No results for input(s): PROBNP in the last 8760 hours. HbA1C: No results for input(s): HGBA1C in the last 72 hours. CBG: No results for input(s): GLUCAP in the last 168 hours. Lipid Profile: No results  for input(s): CHOL, HDL, LDLCALC, TRIG, CHOLHDL, LDLDIRECT in the last 72 hours. Thyroid Function Tests: No results for input(s): TSH, T4TOTAL, FREET4, T3FREE, THYROIDAB in the last 72 hours. Anemia Panel: Recent Labs    10/16/20 1926 10/16/20 2155  VITAMINB12  --  138*  FOLATE 6.1  --   FERRITIN  --  412*  TIBC  --  322  IRON  --  84  RETICCTPCT  --  11.1*   Sepsis Labs: No results for input(s): PROCALCITON, LATICACIDVEN in the last 168 hours.  Recent Results (from the past 240 hour(s))  Resp Panel by RT-PCR (Flu A&B, Covid) Nasopharyngeal Swab     Status: None   Collection Time: 10/17/20  1:01 AM   Specimen: Nasopharyngeal Swab; Nasopharyngeal(NP) swabs in vial transport medium  Result Value Ref Range Status   SARS Coronavirus 2 by RT PCR NEGATIVE NEGATIVE Final    Comment: (NOTE) SARS-CoV-2 target nucleic acids are NOT DETECTED.  The SARS-CoV-2 RNA is generally detectable in upper respiratory specimens during the acute phase of infection. The lowest concentration of SARS-CoV-2 viral copies this assay can detect is 138 copies/mL. A negative result does not preclude SARS-Cov-2 infection and should not be used as the sole basis for treatment or other patient management decisions. A negative result may occur with  improper specimen collection/handling, submission of specimen other than nasopharyngeal swab, presence of viral mutation(s) within the areas targeted by this assay, and inadequate number of  viral copies(<138 copies/mL). A negative result must be combined with clinical observations, patient history, and epidemiological information. The expected result is Negative.  Fact Sheet for Patients:  EntrepreneurPulse.com.au  Fact Sheet for Healthcare Providers:  IncredibleEmployment.be  This test is no t yet approved or cleared by the [place] FDA and  has been authorized for detection and/or diagnosis of SARS-CoV-2 by FDA under an Emergency Use Authorization (EUA). This EUA will remain  in effect (meaning this test can be used) for the duration of the COVID-19 declaration under Section 564(b)(1) of the Act, 21 U.S.C.section 360bbb-3(b)(1), unless the authorization is terminated  or revoked sooner.       Influenza A by PCR NEGATIVE NEGATIVE Final   Influenza B by PCR NEGATIVE NEGATIVE Final    Comment: (NOTE) The Xpert Xpress SARS-CoV-2/FLU/RSV plus assay is intended as an aid in the diagnosis of influenza from Nasopharyngeal swab specimens and should not be used as a sole basis for treatment. Nasal washings and aspirates are unacceptable for Xpert Xpress SARS-CoV-2/FLU/RSV testing.  Fact Sheet for Patients: EntrepreneurPulse.com.au  Fact Sheet for Healthcare Providers: IncredibleEmployment.be  This test is not yet approved or cleared by the [place] FDA and has been authorized for detection and/or diagnosis of SARS-CoV-2 by FDA under an Emergency Use Authorization (EUA). This EUA will remain in effect (meaning this test can be used) for the duration of the COVID-19 declaration under Section 564(b)(1) of the Act, 21 U.S.C. section 360bbb-3(b)(1), unless the authorization is terminated or revoked.  Performed at [facility], [place] [address], [place], [place] [address]       Radiology Studies: DG Chest 1 View  Result Date: 10/16/2020 CLINICAL DATA:  45 year old  female with abdominal pain. EXAM: CHEST  1 VIEW COMPARISON:  Chest radiograph dated 03/27/2020 FINDINGS: Right-sided dialysis catheter with tip at the cavoatrial junction. No focal consolidation, pleural effusion, or pneumothorax. The cardiac silhouette is within limits. Coronary vascular stent. No acute osseous pathology. IMPRESSION: No active cardiopulmonary disease. Electronically Signed  By: Anner Crete M.D.   On: 10/16/2020 16:08    Scheduled Meds:  sodium chloride   Intravenous Once   atorvastatin  40 mg Oral q1800   pantoprazole (PROTONIX) IV  40 mg Intravenous QHS   peg 3350 powder  0.5 kit Oral Once   And   peg 3350 powder  0.5 kit Oral Once   Continuous Infusions:   LOS: 1 day   Time spent: 38 minutes   Darliss Cheney, MD [facility]  10/17/2020, 10:26 AM   How to contact the [facility] Attending or Consulting provider [place] or covering provider during after hours [place], for this patient?  Check the care team in [facility] and look for a) attending/consulting TR[facility] provider listed and b) the [facility] team listed. Page or secure chat [phone]. Log into [email or web address] and use 's universal password to access. If you do not have the password, please contact the hospital operator. Locate the [facility] provider you are looking for under [facility] and page to a number that you can be directly reached. If you still have difficulty reaching the provider, please page the [facility] (Director on Call) for the Hospitalists listed on amion for assistance.

## 2020-10-17 NOTE — H&P (View-Only) (Signed)
Consultation  Referring Provider: Dr. Doristine Bosworth     Primary Care Physician:  Pcp, No Primary Gastroenterologist:  Althia Forts (being transferred to [facility] this afternoon)       Reason for Consultation: Anemia             HPI:   Stacie Greene is a 45 y.o. female with a past medical history of ESRD on HD Monday Wednesday Friday with planned home PD and on renal transplant list, CAD with DES to proximal to mid LAD on 03/29/2020 on aspirin and Brilinta and hypertension, who presented to the ER on 10/16/2020 with dizziness and nausea.  We are consulted in regards to a finding of hemoglobin 4.4 (baseline around 9) and FOBT positive stool.    Today, the patient explains that she had been getting dizzy and anytime that she went to stand up as well as nauseous over the past 2 to 3 days.  Describes seeing some "dark/black stool" over the past 2 days, just once a day.  Denies any heartburn, reflux, abdominal pain or similar previous episodes.    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    Denies NSAID, fever, chills or shortness of breath.  ED course: Hemoglobin 4.4 from baseline of around 9, last aspirin and Brilinta dose on 6/28  GI history: None  Past Medical History:  Diagnosis Date  . CKD (chronic kidney disease), stage IV ([place])   . Hypertension     Past Surgical History:  Procedure Laterality Date  . APPENDECTOMY    . CORONARY STENT INTERVENTION N/A 03/29/2020   Procedure: CORONARY STENT INTERVENTION;  Surgeon: Burnell Blanks, MD;  Location: [place] [facility];  Service: Cardiovascular;  Laterality: N/A;  . INTRAVASCULAR ULTRASOUND/IVUS N/A 03/29/2020   Procedure: Intravascular Ultrasound/IVUS;  Surgeon: Burnell Blanks, MD;  Location: [place] [facility];  Service: Cardiovascular;  Laterality: N/A;  . LEFT HEART CATH AND CORONARY  ANGIOGRAPHY N/A 03/29/2020   Procedure: LEFT HEART CATH AND CORONARY ANGIOGRAPHY;  Surgeon: Burnell Blanks, MD;  Location: [place] [facility];  Service: Cardiovascular;  Laterality: N/A;  . THYROID SURGERY      Family history: No family history of GI cancers  Social History   Tobacco Use  . Smoking status: Former    Pack years: 0.00  . Smokeless tobacco: Never  Vaping Use  . Vaping Use: Never used  Substance Use Topics  . Alcohol use: Not Currently  . Drug use: Never    Prior to Admission medications   Medication Sig Start Date End Date Taking? Authorizing Provider  aspirin 81 MG EC tablet TAKE 1 TABLET (81 MG TOTAL) BY MOUTH DAILY. SWALLOW WHOLE. 04/03/20 04/03/21 Yes Bonnielee Haff, MD  atorvastatin (LIPITOR) 40 MG tablet Take 1 tablet (40 mg total) by mouth daily at 6 PM. 04/10/20  Yes Kilroy, Doreene Burke, PA-C  calcitRIOL (ROCALTROL) 0.25 MCG capsule Take 0.25 mcg by mouth every Monday, Wednesday, and Friday. 02/26/20  Yes [provider]  carvedilol (COREG) 6.25 MG tablet Take 1 tablet (6.25 mg total) by mouth 2 (two) times daily with a meal. 04/10/20  Yes Kilroy, Luke K, PA-C  FEROSUL 325 (65 Fe) MG tablet Take 325 mg by mouth daily. 01/15/20  Yes [provider]  lidocaine-prilocaine (EMLA) cream APPLY SMALL AMOUNT TO ACCESS SITE (  AVF) 1 HOUR BEFORE DIALYSIS. COVER WITH OCCLUSIVE DRESSING (SARAN WRAP) 05/24/20  Yes [provider]  ticagrelor (BRILINTA) 90 MG TABS tablet Take 1 tablet (90 mg total) by mouth 2 (two) times daily. 04/10/20  Yes Kilroy, Luke K, PA-C  VITAMIN D PO Take 1 tablet by mouth daily.   Yes [provider]  nitroGLYCERIN (NITROSTAT) 0.4 MG SL tablet Place 1 tablet (0.4 mg total) under the tongue every 5 (five) minutes as needed for chest pain. Patient not taking: Reported on 10/16/2020 04/10/20 10/16/20  Erlene Quan, PA-C  sodium bicarbonate 650 MG tablet Take 650 mg by mouth 2 (two) times daily. Patient not taking:  Reported on 10/16/2020 11/24/19   [provider]    Current Facility-Administered Medications  Medication Dose Route Frequency Provider Last Rate Last Admin  . atorvastatin (LIPITOR) tablet 40 mg  40 mg Oral q1800 Tu, Ching T, DO      . ondansetron (ZOFRAN) injection 4 mg  4 mg Intravenous Q6H PRN Tu, Ching T, DO   4 mg at 10/17/20 0111  . pantoprazole (PROTONIX) injection 40 mg  40 mg Intravenous QHS Tu, Ching T, DO       Current Outpatient Medications  Medication Sig Dispense Refill  . aspirin 81 MG EC tablet TAKE 1 TABLET (81 MG TOTAL) BY MOUTH DAILY. SWALLOW WHOLE. 30 tablet 11  . atorvastatin (LIPITOR) 40 MG tablet Take 1 tablet (40 mg total) by mouth daily at 6 PM. 90 tablet 3  . calcitRIOL (ROCALTROL) 0.25 MCG capsule Take 0.25 mcg by mouth every Monday, Wednesday, and Friday.    . carvedilol (COREG) 6.25 MG tablet Take 1 tablet (6.25 mg total) by mouth 2 (two) times daily with a meal. 180 tablet 3  . FEROSUL 325 (65 Fe) MG tablet Take 325 mg by mouth daily.    Marland Kitchen lidocaine-prilocaine (EMLA) cream APPLY SMALL AMOUNT TO ACCESS SITE (AVF) 1 HOUR BEFORE DIALYSIS. COVER WITH OCCLUSIVE DRESSING (SARAN WRAP)    . ticagrelor (BRILINTA) 90 MG TABS tablet Take 1 tablet (90 mg total) by mouth 2 (two) times daily. 180 tablet 3  . VITAMIN D PO Take 1 tablet by mouth daily.    . nitroGLYCERIN (NITROSTAT) 0.4 MG SL tablet Place 1 tablet (0.4 mg total) under the tongue every 5 (five) minutes as needed for chest pain. (Patient not taking: Reported on 10/16/2020) 25 tablet 3  . sodium bicarbonate 650 MG tablet Take 650 mg by mouth 2 (two) times daily. (Patient not taking: Reported on 10/16/2020)      Allergies as of 10/16/2020  . (No Known Allergies)     Review of Systems:    Constitutional: No weight loss, fever or chills Skin: No rash  Cardiovascular: No chest pain Respiratory: No SOB  Gastrointestinal: See HPI and otherwise negative Genitourinary: No dysuria  Neurological:  +dizziness Musculoskeletal: No new muscle or joint pain Hematologic: No bruising Psychiatric: No history of depression or anxiety    Physical Exam:  Vital signs in last 24 hours: Temp:  [98.2 F (36.8 C)-98.3 F (36.8 C)] 98.2 F (36.8 C) (06/30 0427) Pulse Rate:  [97-112] 97 (06/30 0800) Resp:  [16-20] 17 (06/30 0800) BP: (113-133)/(57-77) 123/70 (06/30 0800) SpO2:  [100 %] 100 % (06/30 0800) Weight:  [70.3 kg] 70.3 kg (06/29 1905)   General:   Pleasant AA female appears to be in NAD, Well developed, Well nourished, alert and cooperative Head:  Normocephalic and atraumatic. Eyes:   PEERL, EOMI. No  icterus. Conjunctiva pink. Ears:  Normal auditory acuity. Neck:  Supple Throat: Oral cavity and pharynx without inflammation, swelling or lesion.  Lungs: Respirations even and unlabored. Lungs clear to auscultation bilaterally.   No wheezes, crackles, or rhonchi.  Heart: Normal S1, S2. No MRG. Regular rate and rhythm. No peripheral edema, cyanosis or pallor.  Abdomen:  Soft, nondistended, nontender. No rebound or guarding. Normal bowel sounds. No appreciable masses or hepatomegaly. Rectal:  Not performed. (FOBT + per ED physician) Msk:  Symmetrical without gross deformities. Peripheral pulses intact.  Extremities:  Without edema, no deformity or joint abnormality. Neurologic:  Alert and  oriented x4;  grossly normal neurologically.  Skin:   Dry and intact without significant lesions or rashes. Psychiatric: Demonstrates good judgement and reason without abnormal affect or behaviors.   LAB RESULTS: Recent Labs    10/16/20 1714 10/17/20 0820  WBC 19.4* 19.7*  HGB 4.1* 4.9*  HCT 13.6* 15.4*  PLT 355 238   BMET Recent Labs    10/16/20 1714 10/17/20 0820  NA 137 140  K 5.1 5.5*  CL 113* 115*  CO2 12* 13*  GLUCOSE 127* 100*  BUN 118* 139*  CREATININE 10.66* 10.77*  CALCIUM 8.6* 8.1*   LFT Recent Labs    10/16/20 1714  PROT 7.0  ALBUMIN 3.9  AST 11*  ALT 18   ALKPHOS 52  BILITOT 0.5   STUDIES: DG Chest 1 View  Result Date: 10/16/2020 CLINICAL DATA:  45 year old female with abdominal pain. EXAM: CHEST  1 VIEW COMPARISON:  Chest radiograph dated 03/27/2020 FINDINGS: Right-sided dialysis catheter with tip at the cavoatrial junction. No focal consolidation, pleural effusion, or pneumothorax. The cardiac silhouette is within limits. Coronary vascular stent. No acute osseous pathology. IMPRESSION: No active cardiopulmonary disease. Electronically Signed   By: Anner Crete M.D.   On: 10/16/2020 16:08     Impression / Plan:   Impression: 1.  Acute on chronic blood loss anemia: Hemoglobin 4.4 (baseline around 9), FOBT positive, transfused 1 unit PRBCs, patient reports seeing black stools; consider upper GI bleed versus other 2.  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 for nephrology consult 3.  History of CAD status post DES to LAD 03/29/2020: On aspirin and Brilinta, last dose 10/15/2020  Plan: 1.  Patient is now status post 1 PRBCs, she will need recheck of hemoglobin and continue transfusion so that her hemoglobin can remain above 7 in order to have an EGD and colonoscopy tomorrow. 2.  Remain on clear liquid diet today and n.p.o. at midnight. 3.  Start movie prep this afternoon around 4 PM and split dose fashion. 4.  Patient is scheduled for an EGD and colonoscopy tomorrow with Dr. Silverio Decamp assuming that her hemoglobin is at an acceptable level.  Did discuss risks, benefits, limitations and alternatives and patient agrees to proceed. 5.  Agree with Protonix twice daily 6.  Please await any further recommendations later today from the physician on call.  Thank you for your kind consultation, we will continue to follow.  Lavone Nian [facility]  10/17/2020, 9:23 AM   ________________________________________________________________________  Velora Heckler GI MD note:  I  personally examined the patient, reviewed the data and agree with the assessment and plan described above.  Seems most likely an UGI bleed in setting of ASA and brilinta for CAD/stenting (ddx PUD, MW tear, severe gastritis, esophagitis, AVM).  Her Hb nadir was 4.4, I suspect she  will need 3-4 units blood to get Hb >7.  I changed her 2unit PRBC order to 3 units just now.  Hopefully can transfuse to get her >7 to allow safe EGD/colonoscopy tomorrow. PPI IV BID for now.  Prep has been ordered. She is transferring to [facility] soon for HD.    Owens Loffler, MD [facility] Pager [phone]

## 2020-10-18 ENCOUNTER — Inpatient Hospital Stay (HOSPITAL_COMMUNITY): Payer: POS | Admitting: Anesthesiology

## 2020-10-18 ENCOUNTER — Encounter (HOSPITAL_COMMUNITY)
Admission: EM | Disposition: A | Discharge status: Home / Self Care | Payer: Self-pay | Source: Home / Self Care | Attending: Student

## 2020-10-18 ENCOUNTER — Encounter (HOSPITAL_COMMUNITY): Payer: Self-pay | Admitting: Family Medicine

## 2020-10-18 ENCOUNTER — Other Ambulatory Visit: Payer: Self-pay

## 2020-10-18 DIAGNOSIS — E875 Hyperkalemia: Secondary | ICD-10-CM

## 2020-10-18 DIAGNOSIS — E876 Hypokalemia: Secondary | ICD-10-CM

## 2020-10-18 DIAGNOSIS — K922 Gastrointestinal hemorrhage, unspecified: Secondary | ICD-10-CM

## 2020-10-18 DIAGNOSIS — K31819 Angiodysplasia of stomach and duodenum without bleeding: Secondary | ICD-10-CM

## 2020-10-18 DIAGNOSIS — R8281 Pyuria: Secondary | ICD-10-CM

## 2020-10-18 DIAGNOSIS — R8271 Bacteriuria: Secondary | ICD-10-CM

## 2020-10-18 HISTORY — PX: HOT HEMOSTASIS: SHX5433

## 2020-10-18 HISTORY — PX: ESOPHAGOGASTRODUODENOSCOPY (EGD) WITH PROPOFOL: SHX5813

## 2020-10-18 HISTORY — PX: COLONOSCOPY WITH PROPOFOL: SHX5780

## 2020-10-18 LAB — BPAM RBC
Blood Product Expiration Date: 202207232359
Blood Product Expiration Date: 202207232359
Blood Product Expiration Date: 202207232359
ISSUE DATE / TIME: 202206302236
ISSUE DATE / TIME: 202206302236
ISSUE DATE / TIME: 202206302236
Unit Type and Rh: 6200
Unit Type and Rh: 6200
Unit Type and Rh: 6200

## 2020-10-18 LAB — CBC
HCT: 31.3 % — ABNORMAL LOW (ref 36.0–46.0)
Hemoglobin: 11 g/dL — ABNORMAL LOW (ref 12.0–15.0)
MCH: 30.1 pg (ref 26.0–34.0)
MCHC: 35.1 g/dL (ref 30.0–36.0)
MCV: 85.5 fL (ref 80.0–100.0)
Platelets: 195 10*3/uL (ref 150–400)
RBC: 3.66 MIL/uL — ABNORMAL LOW (ref 3.87–5.11)
RDW: 15.1 % (ref 11.5–15.5)
WBC: 16.8 10*3/uL — ABNORMAL HIGH (ref 4.0–10.5)
nRBC: 0.7 % — ABNORMAL HIGH (ref 0.0–0.2)

## 2020-10-18 LAB — TYPE AND SCREEN
ABO/RH(D): A POS
Antibody Screen: NEGATIVE
Unit division: 0
Unit division: 0
Unit division: 0

## 2020-10-18 LAB — RENAL FUNCTION PANEL
Albumin: 3.5 g/dL (ref 3.5–5.0)
Anion gap: 11 (ref 5–15)
BUN: 42 mg/dL — ABNORMAL HIGH (ref 6–20)
CO2: 24 mmol/L (ref 22–32)
Calcium: 7.7 mg/dL — ABNORMAL LOW (ref 8.9–10.3)
Chloride: 101 mmol/L (ref 98–111)
Creatinine, Ser: 5.49 mg/dL — ABNORMAL HIGH (ref 0.44–1.00)
GFR, Estimated: 9 mL/min — ABNORMAL LOW (ref >60)
Glucose, Bld: 107 mg/dL — ABNORMAL HIGH (ref 70–99)
Phosphorus: 4.9 mg/dL — ABNORMAL HIGH (ref 2.5–4.6)
Potassium: 3.4 mmol/L — ABNORMAL LOW (ref 3.5–5.1)
Sodium: 136 mmol/L (ref 135–145)

## 2020-10-18 LAB — IRON AND TIBC
Iron: 227 ug/dL — ABNORMAL HIGH (ref 28–170)
Saturation Ratios: 74 % — ABNORMAL HIGH (ref 10.4–31.8)
TIBC: 305 ug/dL (ref 250–450)
UIBC: 78 ug/dL

## 2020-10-18 LAB — HEMOGLOBIN AND HEMATOCRIT, BLOOD
HCT: 30.5 % — ABNORMAL LOW (ref 36.0–46.0)
HCT: 30.6 % — ABNORMAL LOW (ref 36.0–46.0)
Hemoglobin: 10.7 g/dL — ABNORMAL LOW (ref 12.0–15.0)
Hemoglobin: 10.6 g/dL — ABNORMAL LOW (ref 12.0–15.0)

## 2020-10-18 LAB — FERRITIN: Ferritin: 270 ng/mL (ref 11–307)

## 2020-10-18 SURGERY — ESOPHAGOGASTRODUODENOSCOPY (EGD) WITH PROPOFOL
Anesthesia: Monitor Anesthesia Care

## 2020-10-18 MED ORDER — PEG-KCL-NACL-NASULF-NA ASC-C 100 G PO SOLR
0.5000 | Freq: Once | ORAL | Status: AC
  Administered 2020-10-18: 100 g via ORAL
  Filled 2020-10-18: qty 1

## 2020-10-18 MED ORDER — METOCLOPRAMIDE HCL 5 MG/ML IJ SOLN
10.0000 mg | Freq: Two times a day (BID) | INTRAMUSCULAR | Status: AC
  Administered 2020-10-18 (×2): 10 mg via INTRAVENOUS
  Filled 2020-10-18 (×2): qty 2

## 2020-10-18 MED ORDER — CHLORHEXIDINE GLUCONATE CLOTH 2 % EX PADS: 6.0000 | MEDICATED_PAD | Freq: Every day | CUTANEOUS | Status: DC

## 2020-10-18 MED ORDER — PEG-KCL-NACL-NASULF-NA ASC-C 100 G PO SOLR: 1.0000 | Freq: Once | ORAL | Status: DC

## 2020-10-18 MED ORDER — PROPOFOL 10 MG/ML IV BOLUS
INTRAVENOUS | Status: DC | PRN
  Administered 2020-10-18 (×2): 20 mg via INTRAVENOUS

## 2020-10-18 MED ORDER — SODIUM CHLORIDE 0.9 % IV SOLN: INTRAVENOUS | Status: DC

## 2020-10-18 MED ORDER — SODIUM CHLORIDE 0.9 % IV SOLN
12.5000 mg | Freq: Four times a day (QID) | INTRAVENOUS | Status: DC | PRN
  Administered 2020-10-18: 12.5 mg via INTRAVENOUS
  Filled 2020-10-18: qty 0.5

## 2020-10-18 MED ORDER — PROPOFOL 500 MG/50ML IV EMUL
INTRAVENOUS | Status: DC | PRN
  Administered 2020-10-18: 75 ug/kg/min via INTRAVENOUS

## 2020-10-18 MED ORDER — HEPARIN SODIUM (PORCINE) 1000 UNIT/ML IJ SOLN
INTRAMUSCULAR | Status: AC
  Filled 2020-10-18: qty 4

## 2020-10-18 SURGICAL SUPPLY — 25 items

## 2020-10-18 NOTE — Progress Notes (Signed)
Spoke with Dr. Lyndel Safe concerning the start of the moviprep since pt went to HD. Told to start moviprep once patient returned from HD.

## 2020-10-18 NOTE — Progress Notes (Signed)
Patient received 3 units RBC per order in HD.

## 2020-10-18 NOTE — Progress Notes (Signed)
Patient continues have nausea despite given zofran at 0113. Patient unable to drink much of the moviprep d/t the nausea. Notified Dr. Nevada Crane. Phenergan ordered. Will continue to monitor

## 2020-10-18 NOTE — Anesthesia Postprocedure Evaluation (Signed)
Anesthesia Post Note  Patient: Ritu Siegmann  Procedure(s) Performed: ESOPHAGOGASTRODUODENOSCOPY (EGD) WITH PROPOFOL COLONOSCOPY WITH PROPOFOL HOT HEMOSTASIS (ARGON PLASMA COAGULATION/BICAP)     Patient location during evaluation: Endoscopy Anesthesia Type: MAC Level of consciousness: awake and alert Pain management: pain level controlled Vital Signs Assessment: post-procedure vital signs reviewed and stable Respiratory status: spontaneous breathing, nonlabored ventilation, respiratory function stable and patient connected to nasal cannula oxygen Cardiovascular status: blood pressure returned to baseline and stable Postop Assessment: no apparent nausea or vomiting Anesthetic complications: no   No notable events documented.  Last Vitals:  Vitals:   10/18/20 1104 10/18/20 1138  BP: (!) 133/53 111/72  Pulse: 68 72  Resp: 18 13  Temp:  36.8 C  SpO2: 100% 100%    Last Pain:  Vitals:   10/18/20 1138  TempSrc: Oral  PainSc: 0-No pain                 Jaquawn Saffran L Martel Galvan

## 2020-10-18 NOTE — Progress Notes (Signed)
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  Hemoccult positive.  Nephrology and GI consulted, and she was transferred to [facility] for HD and GI evaluation.  Hgb improved to 11.0 after 3 units.    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  Subjective: Seen and examined this afternoon after she returned from EGD.  No major events overnight of this morning.  No complaints either.  He denies chest pain, dyspnea, palpitation, dizziness, GI or UTI symptoms.  Objective: Vitals:   10/18/20 1042 10/18/20 1053 10/18/20 1104 10/18/20 1138  BP: (!) 93/41 (!) 95/44 (!) 133/53 111/72  Pulse: 73 71 68 72  Resp: (!) 22 (!) $Remo'23 18 13  'YlJBB$ Temp: 98.6 F (37 C)   98.2 F (36.8 C)  TempSrc: Axillary   Oral  SpO2: 100% 100% 100% 100%  Weight:      Height:        Intake/Output Summary (Last 24 hours) at 10/18/2020 1334 Last data filed at 10/18/2020 1037 Gross per 24 hour  Intake 951.02 ml  Output 1100 ml  Net -148.98 ml   Filed Weights   10/16/20 1905 10/18/20 0902  Weight: 70.3 kg 70.3 kg    Examination:  GENERAL: No apparent distress.  Nontoxic. HEENT: MMM.  Vision and hearing grossly intact.  NECK: Supple.  No apparent JVD.  RESP: On RA.  No IWOB.  Fair aeration bilaterally. CVS:  RRR. Heart sounds normal.  ABD/GI/GU: BS+. Abd  soft, NTND.  MSK/EXT:  Moves extremities. No apparent deformity. No edema.  SKIN: no apparent skin lesion or wound NEURO: Awake, alert and oriented appropriately.  No apparent focal neuro deficit. PSYCH: Calm. Normal affect.   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   Microbiology summarized: COVID-19 and influenza PCR nonreactive.  Assessment & Plan: Symptomatic acute blood loss anemia on anemia of chronic disease: Patient is on DAPT for DES stent placed in 03/2020.  Exaggerated response to 3 units.  EGD as above. Recent Labs    03/27/20 1419 03/28/20 0424 03/29/20 0338 03/30/20 0145 04/02/20 0258 10/16/20 1714 10/17/20 0820 10/17/20 2223 10/18/20 0519  HGB 11.2* 9.8* 9.9* 9.4* 9.0* 4.1* 4.9* 7.1* 11.0*  -Appreciate GI recs -Clear liquid diet. -Continue present medications. -The findings and recommendations were discussed with the patient. -Colonoscopy on 7/2.  Continue holding Brilinta -Trend CBC. 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  Missed HD for 2 weeks. Bone mineral disorder Hyperkalemia: Resolved. Hypokalemia: 3.4 -Nephrology managing   Hx of CAD s/p DES to LAD 03/29/2020: No anginal symptoms. -DAPT on hold due to GI bleed.   Acute on chronic leukocytosis/bandemia: Improved. -May need  outpatient hematology evaluation   HLD -continue statin   Essential hypertension: Normotensive. -Continue home Coreg -Fluid management by HD  Bacteriuria/pyuria: No UTI symptoms. -No indication for antibiotics.  Body mass index is 24.28 kg/m.         DVT prophylaxis:  SCDs Start: 10/16/20 2220  Code Status: Full code Family Communication: Patient and/or RN. Available if any question.  Level of care: Progressive.  Change level of care to telemetry. Status is: Inpatient  Remains inpatient appropriate because:Ongoing diagnostic  testing needed not appropriate for outpatient work up and Inpatient level of care appropriate due to severity of illness  Dispo: The patient is from: Home              Anticipated d/c is to: Home              Patient currently is not medically stable to d/c.   Difficult to place patient No       Consultants:  Nephrology Gastroenterology   Sch Meds:  Scheduled Meds:  sodium chloride   Intravenous Once   sodium chloride   Intravenous Once   atorvastatin  40 mg Oral q1800   carvedilol  6.25 mg Oral BID WC   Chlorhexidine Gluconate Cloth  6 each Topical Q0600   darbepoetin (ARANESP) injection - DIALYSIS  200 mcg Intravenous Q Thu-HD   pantoprazole (PROTONIX) IV  40 mg Intravenous Q12H   peg 3350 powder  0.5 kit Oral Once   And   peg 3350 powder  0.5 kit Oral Once   Continuous Infusions:  promethazine (PHENERGAN) injection (IM or IVPB) 12.5 mg (10/18/20 0513)   PRN Meds:.ondansetron (ZOFRAN) IV, promethazine (PHENERGAN) injection (IM or IVPB)  Antimicrobials: Anti-infectives (From admission, onward)    None        I have personally reviewed the following labs and images: CBC: Recent Labs  Lab 10/16/20 1714 10/17/20 0820 10/17/20 2223 10/18/20 0519  WBC 19.4* 19.7* 19.7* 16.8*  NEUTROABS 15.6*  --   --   --   HGB 4.1* 4.9* 7.1* 11.0*  HCT 13.6* 15.4* 20.3* 31.3*  MCV 94.4 91.1 86.4 85.5  PLT 355 238 254 195   BMP &GFR Recent Labs  Lab 10/16/20 1714 10/17/20 0820 10/18/20 0519  NA 137 140 136  K 5.1 5.5* 3.4*  CL 113* 115* 101  CO2 12* 13* 24  GLUCOSE 127* 100* 107*  BUN 118* 139* 42*  CREATININE 10.66* 10.77* 5.49*  CALCIUM 8.6* 8.1* 7.7*  PHOS  --   --  4.9*   Estimated Creatinine Clearance: 12.7 mL/min (A) (by C-G formula based on SCr of 5.49 mg/dL (H)). Liver & Pancreas: Recent Labs  Lab 10/16/20 1714 10/18/20 0519  AST 11*  --   ALT 18  --   ALKPHOS 52  --   BILITOT 0.5  --   PROT 7.0  --   ALBUMIN 3.9 3.5   Recent Labs  Lab  10/16/20 1714  LIPASE 47   No results for input(s): AMMONIA in the last 168 hours. Diabetic: No results for input(s): HGBA1C in the last 72 hours. No results for input(s): GLUCAP in the last 168 hours. Cardiac Enzymes: No results for input(s): CKTOTAL, CKMB, CKMBINDEX, TROPONINI in the last 168 hours. No results for input(s): PROBNP in the last 8760 hours. Coagulation Profile: No results for input(s): INR, PROTIME in the last 168 hours. Thyroid Function Tests: No results for input(s): TSH, T4TOTAL, FREET4, T3FREE, THYROIDAB in the last 72 hours. Lipid Profile:  No results for input(s): CHOL, HDL, LDLCALC, TRIG, CHOLHDL, LDLDIRECT in the last 72 hours. Anemia Panel: Recent Labs    10/16/20 1926 10/16/20 2155 10/18/20 0519  VITAMINB12  --  138*  --   FOLATE 6.1  --   --   FERRITIN  --  412* 270  TIBC  --  322 305  IRON  --  84 227*  RETICCTPCT  --  11.1*  --    Urine analysis:    Component Value Date/Time   COLORURINE YELLOW 10/17/2020 0732   APPEARANCEUR HAZY (A) 10/17/2020 0732   LABSPEC 1.010 10/17/2020 0732   PHURINE 6.0 10/17/2020 0732   GLUCOSEU NEGATIVE 10/17/2020 0732   HGBUR SMALL (A) 10/17/2020 0732   BILIRUBINUR NEGATIVE 10/17/2020 0732   KETONESUR NEGATIVE 10/17/2020 0732   PROTEINUR 30 (A) 10/17/2020 0732   NITRITE NEGATIVE 10/17/2020 0732   LEUKOCYTESUR LARGE (A) 10/17/2020 0732   Sepsis Labs: Invalid input(s): PROCALCITONIN, LACTICIDVEN  Microbiology: Recent Results (from the past 240 hour(s))  Resp Panel by RT-PCR (Flu A&B, Covid) Nasopharyngeal Swab     Status: None   Collection Time: 10/17/20  1:01 AM   Specimen: Nasopharyngeal Swab; Nasopharyngeal(NP) swabs in vial transport medium  Result Value Ref Range Status   SARS Coronavirus 2 by RT PCR NEGATIVE NEGATIVE Final    Comment: (NOTE) SARS-CoV-2 target nucleic acids are NOT DETECTED.  The SARS-CoV-2 RNA is generally detectable in upper respiratory specimens during the acute phase of  infection. The lowest concentration of SARS-CoV-2 viral copies this assay can detect is 138 copies/mL. A negative result does not preclude SARS-Cov-2 infection and should not be used as the sole basis for treatment or other patient management decisions. A negative result may occur with  improper specimen collection/handling, submission of specimen other than nasopharyngeal swab, presence of viral mutation(s) within the areas targeted by this assay, and inadequate number of viral copies(<138 copies/mL). A negative result must be combined with clinical observations, patient history, and epidemiological information. The expected result is Negative.  Fact Sheet for Patients:  BloggerCourse.com  Fact Sheet for Healthcare Providers:  SeriousBroker.it  This test is no t yet approved or cleared by the [place] FDA and  has been authorized for detection and/or diagnosis of SARS-CoV-2 by FDA under an Emergency Use Authorization (EUA). This EUA will remain  in effect (meaning this test can be used) for the duration of the COVID-19 declaration under Section 564(b)(1) of the Act, 21 U.S.C.section 360bbb-3(b)(1), unless the authorization is terminated  or revoked sooner.       Influenza A by PCR NEGATIVE NEGATIVE Final   Influenza B by PCR NEGATIVE NEGATIVE Final    Comment: (NOTE) The Xpert Xpress SARS-CoV-2/FLU/RSV plus assay is intended as an aid in the diagnosis of influenza from Nasopharyngeal swab specimens and should not be used as a sole basis for treatment. Nasal washings and aspirates are unacceptable for Xpert Xpress SARS-CoV-2/FLU/RSV testing.  Fact Sheet for Patients: BloggerCourse.com  Fact Sheet for Healthcare Providers: SeriousBroker.it  This test is not yet approved or cleared by the [place] FDA and has been authorized for detection and/or diagnosis of SARS-CoV-2  by FDA under an Emergency Use Authorization (EUA). This EUA will remain in effect (meaning this test can be used) for the duration of the COVID-19 declaration under Section 564(b)(1) of the Act, 21 U.S.C. section 360bbb-3(b)(1), unless the authorization is terminated or revoked.  Performed at [facility], [address] [place], [place] [address]  Radiology Studies: No results found.    Jaicion Laurie T. [place]  If 7PM-7AM, please contact night-coverage [email or web address] 10/18/2020, 1:34 PM

## 2020-10-18 NOTE — Interval H&P Note (Signed)
History and Physical Interval Note:  10/18/2020 10:02 AM  Stacie Greene  has presented today for surgery, with the diagnosis of Anemia.  The various methods of treatment have been discussed with the patient and family. After consideration of risks, benefits and other options for treatment, the patient has consented to  Procedure(s): ESOPHAGOGASTRODUODENOSCOPY (EGD) WITH PROPOFOL (N/A) COLONOSCOPY WITH PROPOFOL (N/A) as a surgical intervention.  The patient's history has been reviewed, patient examined, no change in status, stable for surgery.  I have reviewed the patient's chart and labs.  Questions were answered to the patient's satisfaction.    Unfortunately, she  she got preparation very late for colonoscopy and  did not have any bowel movements.  Hence, colonoscopy was canceled.  We will proceed with EGD.   Jackquline Denmark

## 2020-10-18 NOTE — Plan of Care (Signed)
  Problem: Clinical Measurements: Goal: Will remain free from infection Outcome: Progressing Goal: Diagnostic test results will improve Outcome: Progressing   

## 2020-10-18 NOTE — Progress Notes (Signed)
[facility]  10/18/2020 1:49 PM  Stopped by patient's room this afternoon per request of Dr. Silverio Decamp.  Patient would like to give the bowel prep another try.  She thinks what made her so nauseous yesterday was dialysis treatment because "it always does".  She would prefer to get this procedure done while she is in the hospital if able.  Ordered movi prep for this afternoon with the Reglan dosing 30 minutes prior to each half of prep to help with this.  Patient is scheduled for colonoscopy tomorrow morning with Dr. Silverio Decamp.  Ellouise Newer, PA-C

## 2020-10-18 NOTE — Transfer of Care (Signed)
Immediate Anesthesia Transfer of Care Note  Patient: Stacie Greene  Procedure(s) Performed: ESOPHAGOGASTRODUODENOSCOPY (EGD) WITH PROPOFOL COLONOSCOPY WITH PROPOFOL HOT HEMOSTASIS (ARGON PLASMA COAGULATION/BICAP)  Patient Location: Endoscopy Unit  Anesthesia Type:MAC  Level of Consciousness: drowsy  Airway & Oxygen Therapy: Patient Spontanous Breathing and Patient connected to nasal cannula oxygen  Post-op Assessment: Report given to RN and Post -op Vital signs reviewed and stable  Post vital signs: Reviewed and stable  Last Vitals:  Vitals Value Taken Time  BP 93/41 10/18/20 1043  Temp    Pulse 77 10/18/20 1043  Resp 19 10/18/20 1043  SpO2 100 % 10/18/20 1043  Vitals shown include unvalidated device data.  Last Pain:  Vitals:   10/18/20 0902  TempSrc: Oral  PainSc: 0-No pain         Complications: No notable events documented.

## 2020-10-18 NOTE — Progress Notes (Signed)
Patient does not want to do dialysis tonight. Patient can not do both bowel prep and HD d/t being nauseous after HD last night. Patient spoke with PA from nephrology today and told her the same thing.

## 2020-10-18 NOTE — Op Note (Signed)
[facility] Patient Name: Stacie Greene Procedure Date : 10/18/2020 MRN: [id number] Attending MD: Jackquline Denmark , MD Date of Birth: 04-May-1975 CSN: [id number] Age: 45 Admit Type: Inpatient Procedure:                Upper GI endoscopy Indications:              1. Acute on chronic blood loss anemia: Hemoglobin                            4.4 (baseline around 9) with melena s/p 5U PRBC                           2. ESRD on HD                           3. History of CAD status post DES to LAD                            03/29/2020: On aspirin and Brilinta, last dose                            10/15/2020 Providers:                Arelia Sneddon MD. Kary Kos RN, RN, Terrall Laity,                            Tyna Jaksch Technician, Jackquline Denmark, MD Referring MD:              Medicines:                Monitored Anesthesia Care Complications:            No immediate complications. Estimated Blood Loss:     Estimated blood loss: none. Procedure:                Pre-Anesthesia Assessment:                           - Prior to the procedure, a History and Physical                            was performed, and patient medications and                            allergies were reviewed. The patient's tolerance of                            previous anesthesia was also reviewed. The risks                            and benefits of the procedure and the sedation                            options and risks were discussed with the patient.  All questions were answered, and informed consent                            was obtained. Prior Anticoagulants: The patient has                            taken Brilinta, last dose was 3 days prior to                            procedure. ASA Grade Assessment: III - A patient                            with severe systemic disease. After reviewing the                            risks and benefits, the patient was deemed in                             satisfactory condition to undergo the procedure.                           After obtaining informed consent, the endoscope was                            passed under direct vision. Throughout the                            procedure, the patient's blood pressure, pulse, and                            oxygen saturations were monitored continuously. The                            [id number] Olympus gastroscope was                            introduced through the mouth, and advanced to the                            second part of duodenum. The upper GI endoscopy was                            accomplished without difficulty. The patient                            tolerated the procedure well. Scope In: Scope Out: Findings:      The examined esophagus was normal. Healed distal esophageal erosions       were noted.      A small transient hiatal hernia was present.      A single 4 mm angiodysplastic lesion without bleeding was found in the       second portion of the duodenum. Coagulation for hemostasis using argon       plasma was successful. Impression:               -  Small hiatal hernia.                           - A single non-bleeding angiodysplastic lesion in                            the duodenum. Treated with argon plasma coagulation                            (APC).                           - No active UGI bleeding. Recommendation:           - Return patient to hospital ward for ongoing care.                           - Clear liquid diet.                           - Continue present medications.                           - The findings and recommendations were discussed                            with the patient.                           - Reprep today for colonoscopy in AM. Would                            recommend holding Brilinta until colonoscopy.                           - Trend CBC. Procedure Code(s):        --- Professional ---                            [phone], Esophagogastroduodenoscopy, flexible,                            transoral; with control of bleeding, any method Diagnosis Code(s):        --- Professional ---                           K44.9, Diaphragmatic hernia without obstruction or                            gangrene                           K31.819, Angiodysplasia of stomach and duodenum                            without bleeding  K92.2, Gastrointestinal hemorrhage, unspecified CPT copyright 2019 American Medical Association. All rights reserved. The codes documented in this report are preliminary and upon coder review may  be revised to meet current compliance requirements. Jackquline Denmark, MD 10/18/2020 10:55:30 AM This report has been signed electronically. Number of Addenda: 0

## 2020-10-18 NOTE — H&P (View-Only) (Signed)
[facility]  10/18/2020 1:49 PM  Stopped by patient's room this afternoon per request of Dr. Silverio Decamp.  Patient would like to give the bowel prep another try.  She thinks what made her so nauseous yesterday was dialysis treatment because "it always does".  She would prefer to get this procedure done while she is in the hospital if able.  Ordered movi prep for this afternoon with the Reglan dosing 30 minutes prior to each half of prep to help with this.  Patient is scheduled for colonoscopy tomorrow morning with Dr. Silverio Decamp.  Ellouise Newer, PA-C

## 2020-10-18 NOTE — Progress Notes (Addendum)
[facility] Progress Note   Subjective:   Patient seen and examined at bedside. Feeling ok. Fatigue improved.  No BM.  Reports nausea with HD yesterday when BP dropped.  Denies CP, SOB, n/v/d today. Getting ready to start prep.    Objective Vitals:   10/18/20 1053 10/18/20 1104 10/18/20 1138 10/18/20 1632  BP: (!) 95/44 (!) 133/53 111/72 116/61  Pulse: 71 68 72 79  Resp: (!) $RemoveB'23 18 13 13  'NXnzfxFH$ Temp:   98.2 F (36.8 C) 98.7 F (37.1 C)  TempSrc:   Oral Oral  SpO2: 100% 100% 100% 100%  Weight:      Height:       Physical Exam General: well appearing female in NAD Heart:RRR Lungs: CTAB, nml WOB on RA Abdomen:soft, NTND Extremities:no LE edema Dialysis Access: [facility]   [facility] Weights   10/16/20 1905 10/18/20 0902  Weight: 70.3 kg 70.3 kg    Intake/Output Summary (Last 24 hours) at 10/18/2020 1640 Last data filed at 10/18/2020 1037 Gross per 24 hour  Intake 304.49 ml  Output 1100 ml  Net -795.51 ml    Additional Objective Labs: Basic Metabolic Panel: Recent Labs  Lab 10/16/20 1714 10/17/20 0820 10/18/20 0519  NA 137 140 136  K 5.1 5.5* 3.4*  CL 113* 115* 101  CO2 12* 13* 24  GLUCOSE 127* 100* 107*  BUN 118* 139* 42*  CREATININE 10.66* 10.77* 5.49*  CALCIUM 8.6* 8.1* 7.7*  PHOS  --   --  4.9*   Liver Function Tests: Recent Labs  Lab 10/16/20 1714 10/18/20 0519  AST 11*  --   ALT 18  --   ALKPHOS 52  --   BILITOT 0.5  --   PROT 7.0  --   ALBUMIN 3.9 3.5   Recent Labs  Lab 10/16/20 1714  LIPASE 47   CBC: Recent Labs  Lab 10/16/20 1714 10/17/20 0820 10/17/20 2223 10/18/20 0519 10/18/20 1500  WBC 19.4* 19.7* 19.7* 16.8*  --   NEUTROABS 15.6*  --   --   --   --   HGB 4.1* 4.9* 7.1* 11.0* 10.7*  HCT 13.6* 15.4* 20.3* 31.3* 30.5*  MCV 94.4 91.1 86.4 85.5  --   PLT 355 238 254 195  --    Iron Studies:  Recent Labs    10/18/20 0519  IRON 227*  TIBC 305  FERRITIN 270     Medications:  promethazine (PHENERGAN) injection (IM or  IVPB) 12.5 mg (10/18/20 0513)    sodium chloride   Intravenous Once   sodium chloride   Intravenous Once   atorvastatin  40 mg Oral q1800   carvedilol  6.25 mg Oral BID WC   Chlorhexidine Gluconate Cloth  6 each Topical Q0600   darbepoetin (ARANESP) injection - DIALYSIS  200 mcg Intravenous Q Thu-HD   metoCLOPramide (REGLAN) injection  10 mg Intravenous BID   pantoprazole (PROTONIX) IV  40 mg Intravenous Q12H   peg 3350 powder  0.5 kit Oral Once    Dialysis Orders: MWF-[facility] ([place]) 2hrs BFR: 200 DFR: 500 EDW: 71.5kg 3K, 2.5Ca L AVF   Heparin 2,000 unit bolus with HD treatments Hectorol 96mcg IV 3x/weekly-last dose 09/23/20 Micera 180mcg IV Q2wks-last dose 08/14/20 Previously ordered Venofer $RemoveBeforeD'100mg'coUtcCTWMXXrZY$  IV X 10 doses at AF but never received since not coming to treatment.     Assessment/ Plan: Acute/ chronic blood loss anemia - b/l Hb around 9-10. Hgb 4.4 on admit.  Transfused 2u prbc's yesteray and  Hgb now 10.7. FOBT+. EGD this AM with non bleeding angiodysplastic lesion in duodenum treated with APC.  Did not tolerated prep for colonoscopy yesterday.  Plan to try again and colonoscopy scheduled for tomorrow.  ESRD - on HD MWF. Has missed total 10 HD txs (last HD 09/23/20)-due to work. HD yesterday.  Orders placed for HD tonight to get back on schedule.   HTN - BP soft. Home coreg on hold Volume - Does not appear volume overloaded. RUn even on HD tonight. Anemia ckd - see above-last ESA dose given OP in April.  ESA not given with HD last night.  Order with HD today.  Tsat 74%, no indication for IV iron. MBD ckd - Ca and phos in goal. Pth pending.  Nutrition: Renal diet w/fluid restrictions once eating. Alb 3.5.   Jen Mow, PA-C [facility] 10/18/2020,4:40 PM  LOS: 2 days

## 2020-10-18 NOTE — Anesthesia Procedure Notes (Signed)
Procedure Name: MAC Date/Time: 10/18/2020 10:20 AM Performed by: Fulton Reek, CRNA Pre-anesthesia Checklist: Patient identified, Emergency Drugs available, Suction available, Patient being monitored and Timeout performed Patient Re-evaluated:Patient Re-evaluated prior to induction Oxygen Delivery Method: Nasal cannula Dental Injury: Teeth and Oropharynx as per pre-operative assessment

## 2020-10-18 NOTE — Anesthesia Preprocedure Evaluation (Addendum)
Anesthesia Evaluation    Reviewed: Allergy & Precautions, Patient's Chart, lab work & pertinent test results, reviewed documented beta blocker date and time   Airway Mallampati: III  TM Distance: >3 FB Neck ROM: Full    Dental  (+) Poor Dentition, Dental Advisory Given, Missing, Chipped,    Pulmonary neg pulmonary ROS, former smoker,    Pulmonary exam normal breath sounds clear to auscultation       Cardiovascular hypertension, Pt. on home beta blockers and Pt. on medications + CAD, + Past MI and + Cardiac Stents (03/2020)  Normal cardiovascular exam Rhythm:Regular Rate:Normal     Neuro/Psych negative neurological ROS  negative psych ROS   GI/Hepatic negative GI ROS, Neg liver ROS,   Endo/Other  negative endocrine ROS  Renal/GU ESRF and DialysisRenal disease (HD MWF, Cr 5.49, K 3.4, last dialysis yesterday)  negative genitourinary   Musculoskeletal negative musculoskeletal ROS (+)   Abdominal   Peds  Hematology  (+) Blood dyscrasia (on brilinta, Hgb 11), anemia ,   Anesthesia Other Findings EGD for anemia. Presented to the ED with nausea and dizziness found to have Hgb 4.4. Hgb now 11  Reproductive/Obstetrics                            Anesthesia Physical Anesthesia Plan  ASA: 3  Anesthesia Plan: MAC   Post-op Pain Management:    Induction: Intravenous  PONV Risk Score and Plan: 2 and Propofol infusion and Treatment may vary due to age or medical condition  Airway Management Planned: Natural Airway  Additional Equipment:   Intra-op Plan:   Post-operative Plan:   Informed Consent: I have reviewed the patients History and Physical, chart, labs and discussed the procedure including the risks, benefits and alternatives for the proposed anesthesia with the patient or authorized representative who has indicated his/her understanding and acceptance.     Dental advisory given  Plan  Discussed with: CRNA  Anesthesia Plan Comments:         Anesthesia Quick Evaluation

## 2020-10-19 ENCOUNTER — Inpatient Hospital Stay (HOSPITAL_COMMUNITY): Payer: POS | Admitting: Certified Registered Nurse Anesthetist

## 2020-10-19 ENCOUNTER — Encounter (HOSPITAL_COMMUNITY): Payer: Self-pay | Admitting: Family Medicine

## 2020-10-19 ENCOUNTER — Encounter (HOSPITAL_COMMUNITY)
Admission: EM | Disposition: A | Discharge status: Home / Self Care | Payer: Self-pay | Source: Home / Self Care | Attending: Student

## 2020-10-19 DIAGNOSIS — D649 Anemia, unspecified: Secondary | ICD-10-CM

## 2020-10-19 DIAGNOSIS — D509 Iron deficiency anemia, unspecified: Secondary | ICD-10-CM

## 2020-10-19 DIAGNOSIS — K921 Melena: Secondary | ICD-10-CM

## 2020-10-19 DIAGNOSIS — K922 Gastrointestinal hemorrhage, unspecified: Secondary | ICD-10-CM

## 2020-10-19 HISTORY — PX: COLONOSCOPY WITH PROPOFOL: SHX5780

## 2020-10-19 LAB — PARATHYROID HORMONE, INTACT (NO CA): PTH: 210 pg/mL — ABNORMAL HIGH (ref 15–65)

## 2020-10-19 LAB — RENAL FUNCTION PANEL
Albumin: 3.4 g/dL — ABNORMAL LOW (ref 3.5–5.0)
Anion gap: 14 (ref 5–15)
BUN: 49 mg/dL — ABNORMAL HIGH (ref 6–20)
CO2: 22 mmol/L (ref 22–32)
Calcium: 7.4 mg/dL — ABNORMAL LOW (ref 8.9–10.3)
Chloride: 100 mmol/L (ref 98–111)
Creatinine, Ser: 7.12 mg/dL — ABNORMAL HIGH (ref 0.44–1.00)
GFR, Estimated: 7 mL/min — ABNORMAL LOW (ref >60)
Glucose, Bld: 89 mg/dL (ref 70–99)
Phosphorus: 5.1 mg/dL — ABNORMAL HIGH (ref 2.5–4.6)
Potassium: 3.4 mmol/L — ABNORMAL LOW (ref 3.5–5.1)
Sodium: 136 mmol/L (ref 135–145)

## 2020-10-19 LAB — HEMOGLOBIN AND HEMATOCRIT, BLOOD
HCT: 31.5 % — ABNORMAL LOW (ref 36.0–46.0)
Hemoglobin: 10.8 g/dL — ABNORMAL LOW (ref 12.0–15.0)

## 2020-10-19 LAB — CBC
HCT: 29.6 % — ABNORMAL LOW (ref 36.0–46.0)
Hemoglobin: 10.3 g/dL — ABNORMAL LOW (ref 12.0–15.0)
MCH: 30.3 pg (ref 26.0–34.0)
MCHC: 34.8 g/dL (ref 30.0–36.0)
MCV: 87.1 fL (ref 80.0–100.0)
Platelets: 178 10*3/uL (ref 150–400)
RBC: 3.4 MIL/uL — ABNORMAL LOW (ref 3.87–5.11)
RDW: 15.3 % (ref 11.5–15.5)
WBC: 12.7 10*3/uL — ABNORMAL HIGH (ref 4.0–10.5)
nRBC: 0.2 % (ref 0.0–0.2)

## 2020-10-19 LAB — MAGNESIUM: Magnesium: 1.7 mg/dL (ref 1.7–2.4)

## 2020-10-19 SURGERY — COLONOSCOPY WITH PROPOFOL
Anesthesia: Monitor Anesthesia Care

## 2020-10-19 MED ORDER — PROPOFOL 500 MG/50ML IV EMUL
INTRAVENOUS | Status: DC | PRN
  Administered 2020-10-19: 100 ug/kg/min via INTRAVENOUS

## 2020-10-19 MED ORDER — LIDOCAINE-PRILOCAINE 2.5-2.5 % EX CREA: 1.0000 "application " | TOPICAL_CREAM | CUTANEOUS | Status: DC | PRN

## 2020-10-19 MED ORDER — SODIUM CHLORIDE 0.9 % IV SOLN: 100.0000 mL | INTRAVENOUS | Status: DC | PRN

## 2020-10-19 MED ORDER — HEPARIN SODIUM (PORCINE) 1000 UNIT/ML DIALYSIS: 1000.0000 [IU] | INTRAMUSCULAR | Status: DC | PRN

## 2020-10-19 MED ORDER — DARBEPOETIN ALFA 200 MCG/0.4ML IJ SOSY: 200.0000 ug | PREFILLED_SYRINGE | INTRAMUSCULAR | Status: DC

## 2020-10-19 MED ORDER — PENTAFLUOROPROP-TETRAFLUOROETH EX AERO: 1.0000 "application " | INHALATION_SPRAY | CUTANEOUS | Status: DC | PRN

## 2020-10-19 MED ORDER — LIDOCAINE HCL (PF) 1 % IJ SOLN: 5.0000 mL | INTRAMUSCULAR | Status: DC | PRN

## 2020-10-19 MED ORDER — ALTEPLASE 2 MG IJ SOLR: 2.0000 mg | Freq: Once | INTRAMUSCULAR | Status: DC | PRN

## 2020-10-19 MED ORDER — PROPOFOL 10 MG/ML IV BOLUS
INTRAVENOUS | Status: DC | PRN
  Administered 2020-10-19 (×2): 20 mg via INTRAVENOUS
  Administered 2020-10-19: 10 mg via INTRAVENOUS

## 2020-10-19 MED ORDER — HEPARIN SODIUM (PORCINE) 1000 UNIT/ML IJ SOLN
INTRAMUSCULAR | Status: AC
  Filled 2020-10-19: qty 4

## 2020-10-19 MED ORDER — SODIUM CHLORIDE 0.9 % IV SOLN: INTRAVENOUS | Status: DC | PRN

## 2020-10-19 SURGICAL SUPPLY — 21 items

## 2020-10-19 NOTE — Progress Notes (Signed)
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  Hemoccult positive.  Nephrology and GI consulted, and she was transferred to [facility] for HD and GI evaluation.  Hgb improved to 11.0 after 3 units.    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  Colonoscopy on 7/2 with nonbleeding internal hemorrhoids otherwise normal.  Subjective: Seen and examined earlier this morning.  No major events overnight of this morning.  Refused to go for dialysis as she had to work on bowel prep last night.  No complaint this morning.  She denies chest pain, dyspnea, GI or UTI symptoms.  Denies rectal bleed.  Objective: Vitals:   10/19/20 0729 10/19/20 1127 10/19/20 1215 10/19/20 1230  BP: 130/70 129/65 (!) 101/55 (!) 111/96  Pulse: 80  76 74  Resp: '15 14 15 17  '$ Temp: 98.6 F (37 C) 98.1 F (36.7 C) 97.8 F (36.6 C) 98 F (36.7 C)  TempSrc: Oral Oral    SpO2: 100% 99% 100% 100%  Weight:      Height:        Intake/Output Summary (Last 24 hours) at 10/19/2020 1436 Last data filed at 10/19/2020 1210 Gross per 24 hour  Intake 163.51 ml  Output --  Net 163.51 ml   Filed Weights   10/16/20 1905 10/18/20 0902 10/19/20 0640  Weight: 70.3 kg 70.3 kg 68.5 kg    Examination:  GENERAL: No apparent distress.  Nontoxic. HEENT: MMM.  Vision and hearing  grossly intact.  NECK: Supple.  No apparent JVD.  RESP: On RA.  No IWOB.  Fair aeration bilaterally. CVS:  RRR. Heart sounds normal.  ABD/GI/GU: BS+. Abd soft, NTND.  MSK/EXT:  Moves extremities. No apparent deformity. No edema.  SKIN: no apparent skin lesion or wound NEURO: Awake, alert and oriented appropriately.  No apparent focal neuro deficit. PSYCH: Calm. Normal affect.   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  7/2-colonoscopy with nonbleeding internal hemorrhoid.  Otherwise, normal.  Microbiology summarized: COVID-19 and influenza PCR nonreactive.  Assessment & Plan: Symptomatic acute blood loss anemia on anemia of chronic disease: on DAPT for DES stent placed in 03/2020.  EGD and Colo as above.  Hgb up from 4.1 to 11 after 3 units and dialysis.  Recent Labs    03/29/20 0338 03/30/20 0145 04/02/20 0258 10/16/20 1714 10/17/20 0820 10/17/20 2223 10/18/20 0519 10/18/20 1500 10/18/20 2249 10/19/20 0617  HGB 9.9* 9.4* 9.0* 4.1* 4.9* 7.1* 11.0* 10.7* 10.6* 10.3*  -Appreciate GI recs  -Resume diet. -Okay to restart DAPT on 7/3. -Trend CBC. 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 Bone mineral disorder Hyperkalemia:  Resolved. Hypokalemia: 3.4 -Nephrology managing-did not go for HD last night   Hx of CAD s/p DES to LAD 03/29/2020: No anginal symptoms. -Okay to resume DAPT on 7/3 per GI   Acute on chronic leukocytosis/bandemia: Improving. -May need outpatient hematology evaluation   Hyperlipidemia -continue statin   Essential hypertension: Normotensive. -Continue home Coreg -Fluid management by HD  Bacteriuria/pyuria: No UTI symptoms. -No indication for antibiotics.  Body mass index is 23.67 kg/m.         DVT prophylaxis:  SCDs Start: 10/16/20 2220  Code Status: Full code Family Communication: Patient  and/or RN. Available if any question.  Level of care: Progressive.  Change level of care to telemetry. Status is: Inpatient  Remains inpatient appropriate because:Inpatient level of care appropriate due to severity of illness and and need for further dialysis prior to discharge  Dispo: The patient is from: Home              Anticipated d/c is to: Home              Patient currently is not medically stable to d/c.   Difficult to place patient No       Consultants:  Nephrology Gastroenterology   Sch Meds:  Scheduled Meds:  sodium chloride   Intravenous Once   sodium chloride   Intravenous Once   atorvastatin  40 mg Oral q1800   carvedilol  6.25 mg Oral BID WC   Chlorhexidine Gluconate Cloth  6 each Topical Q0600   darbepoetin (ARANESP) injection - DIALYSIS  200 mcg Intravenous Q Sat-HD   pantoprazole (PROTONIX) IV  40 mg Intravenous Q12H   Continuous Infusions:  promethazine (PHENERGAN) injection (IM or IVPB) 12.5 mg (10/18/20 0513)   PRN Meds:.ondansetron (ZOFRAN) IV, promethazine (PHENERGAN) injection (IM or IVPB)  Antimicrobials: Anti-infectives (From admission, onward)    None        I have personally reviewed the following labs and images: CBC: Recent Labs  Lab 10/16/20 1714 10/17/20 0820 10/17/20 2223 10/18/20 0519 10/18/20 1500 10/18/20 2249 10/19/20 0617  WBC 19.4* 19.7* 19.7* 16.8*  --   --  12.7*  NEUTROABS 15.6*  --   --   --   --   --   --   HGB 4.1* 4.9* 7.1* 11.0* 10.7* 10.6* 10.3*  HCT 13.6* 15.4* 20.3* 31.3* 30.5* 30.6* 29.6*  MCV 94.4 91.1 86.4 85.5  --   --  87.1  PLT 355 238 254 195  --   --  178   BMP &GFR Recent Labs  Lab 10/16/20 1714 10/17/20 0820 10/18/20 0519 10/19/20 0002 10/19/20 0617  NA 137 140 136 136  --   K 5.1 5.5* 3.4* 3.4*  --   CL 113* 115* 101 100  --   CO2 12* 13* 24 22  --   GLUCOSE 127* 100* 107* 89  --   BUN 118* 139* 42* 49*  --   CREATININE 10.66* 10.77* 5.49* 7.12*  --   CALCIUM 8.6* 8.1* 7.7* 7.4*   --   MG  --   --   --   --  1.7  PHOS  --   --  4.9* 5.1*  --    Estimated Creatinine Clearance: 9.8 mL/min (A) (by C-G formula based on SCr of 7.12 mg/dL (H)). Liver & Pancreas: Recent Labs  Lab 10/16/20 1714 10/18/20 0519 10/19/20 0002  AST 11*  --   --   ALT 18  --   --   Arabella Merles  52  --   --   BILITOT 0.5  --   --   PROT 7.0  --   --   ALBUMIN 3.9 3.5 3.4*   Recent Labs  Lab 10/16/20 1714  LIPASE 47   No results for input(s): AMMONIA in the last 168 hours. Diabetic: No results for input(s): HGBA1C in the last 72 hours. No results for input(s): GLUCAP in the last 168 hours. Cardiac Enzymes: No results for input(s): CKTOTAL, CKMB, CKMBINDEX, TROPONINI in the last 168 hours. No results for input(s): PROBNP in the last 8760 hours. Coagulation Profile: No results for input(s): INR, PROTIME in the last 168 hours. Thyroid Function Tests: No results for input(s): TSH, T4TOTAL, FREET4, T3FREE, THYROIDAB in the last 72 hours. Lipid Profile: No results for input(s): CHOL, HDL, LDLCALC, TRIG, CHOLHDL, LDLDIRECT in the last 72 hours. Anemia Panel: Recent Labs    10/16/20 1926 10/16/20 2155 10/18/20 0519  VITAMINB12  --  138*  --   FOLATE 6.1  --   --   FERRITIN  --  412* 270  TIBC  --  322 305  IRON  --  84 227*  RETICCTPCT  --  11.1*  --    Urine analysis:    Component Value Date/Time   COLORURINE YELLOW 10/17/2020 0732   APPEARANCEUR HAZY (A) 10/17/2020 0732   LABSPEC 1.010 10/17/2020 0732   PHURINE 6.0 10/17/2020 0732   GLUCOSEU NEGATIVE 10/17/2020 0732   HGBUR SMALL (A) 10/17/2020 0732   BILIRUBINUR NEGATIVE 10/17/2020 0732   KETONESUR NEGATIVE 10/17/2020 0732   PROTEINUR 30 (A) 10/17/2020 0732   NITRITE NEGATIVE 10/17/2020 0732   LEUKOCYTESUR LARGE (A) 10/17/2020 0732   Sepsis Labs: Invalid input(s): PROCALCITONIN, [place]  Microbiology: Recent Results (from the past 240 hour(s))  Resp Panel by RT-PCR (Flu A&B, Covid) Nasopharyngeal Swab      Status: None   Collection Time: 10/17/20  1:01 AM   Specimen: Nasopharyngeal Swab; Nasopharyngeal(NP) swabs in vial transport medium  Result Value Ref Range Status   SARS Coronavirus 2 by RT PCR NEGATIVE NEGATIVE Final    Comment: (NOTE) SARS-CoV-2 target nucleic acids are NOT DETECTED.  The SARS-CoV-2 RNA is generally detectable in upper respiratory specimens during the acute phase of infection. The lowest concentration of SARS-CoV-2 viral copies this assay can detect is 138 copies/mL. A negative result does not preclude SARS-Cov-2 infection and should not be used as the sole basis for treatment or other patient management decisions. A negative result may occur with  improper specimen collection/handling, submission of specimen other than nasopharyngeal swab, presence of viral mutation(s) within the areas targeted by this assay, and inadequate number of viral copies(<138 copies/mL). A negative result must be combined with clinical observations, patient history, and epidemiological information. The expected result is Negative.  Fact Sheet for Patients:  EntrepreneurPulse.com.au  Fact Sheet for Healthcare Providers:  IncredibleEmployment.be  This test is no t yet approved or cleared by the [place] FDA and  has been authorized for detection and/or diagnosis of SARS-CoV-2 by FDA under an Emergency Use Authorization (EUA). This EUA will remain  in effect (meaning this test can be used) for the duration of the COVID-19 declaration under Section 564(b)(1) of the Act, 21 U.S.C.section 360bbb-3(b)(1), unless the authorization is terminated  or revoked sooner.       Influenza A by PCR NEGATIVE NEGATIVE Final   Influenza B by PCR NEGATIVE NEGATIVE Final    Comment: (NOTE) The Xpert Xpress SARS-CoV-2/FLU/RSV plus assay is intended  as an aid in the diagnosis of influenza from Nasopharyngeal swab specimens and should not be used as a sole basis  for treatment. Nasal washings and aspirates are unacceptable for Xpert Xpress SARS-CoV-2/FLU/RSV testing.  Fact Sheet for Patients: EntrepreneurPulse.com.au  Fact Sheet for Healthcare Providers: IncredibleEmployment.be  This test is not yet approved or cleared by the [place] FDA and has been authorized for detection and/or diagnosis of SARS-CoV-2 by FDA under an Emergency Use Authorization (EUA). This EUA will remain in effect (meaning this test can be used) for the duration of the COVID-19 declaration under Section 564(b)(1) of the Act, 21 U.S.C. section 360bbb-3(b)(1), unless the authorization is terminated or revoked.  Performed at [facility], [place] [address] [place], [place] [address]     Radiology Studies: No results found.    Xzavien Harada T. Larimore  If 7PM-7AM, please contact night-coverage [email or web address] 10/19/2020, 2:36 PM

## 2020-10-19 NOTE — Transfer of Care (Signed)
Immediate Anesthesia Transfer of Care Note  Patient: Stacie Greene  Procedure(s) Performed: COLONOSCOPY WITH PROPOFOL  Patient Location: PACU  Anesthesia Type:MAC  Level of Consciousness: sedated  Airway & Oxygen Therapy: Patient Spontanous Breathing  Post-op Assessment: Report given to RN and Post -op Vital signs reviewed and stable  Post vital signs: Reviewed and stable  Last Vitals:  Vitals Value Taken Time  BP 101/55 10/19/20 1215  Temp    Pulse 76 10/19/20 1215  Resp    SpO2 100 % 10/19/20 1215    Last Pain:  Vitals:   10/19/20 1127  TempSrc: Oral  PainSc: 0-No pain         Complications: No notable events documented.

## 2020-10-19 NOTE — Interval H&P Note (Signed)
History and Physical Interval Note:  10/19/2020 11:45 AM  Stacie Greene  has presented today for surgery, with the diagnosis of Anemia.  The various methods of treatment have been discussed with the patient and family. After consideration of risks, benefits and other options for treatment, the patient has consented to  Procedure(s): COLONOSCOPY WITH PROPOFOL (N/A) as a surgical intervention.  The patient's history has been reviewed, patient examined, no change in status, stable for surgery.  I have reviewed the patient's chart and labs.  Questions were answered to the patient's satisfaction.     Stacie Greene

## 2020-10-19 NOTE — Anesthesia Postprocedure Evaluation (Signed)
Anesthesia Post Note  Patient: Stacie Greene  Procedure(s) Performed: COLONOSCOPY WITH PROPOFOL     Patient location during evaluation: Endoscopy Anesthesia Type: MAC Level of consciousness: awake and alert Pain management: pain level controlled Vital Signs Assessment: post-procedure vital signs reviewed and stable Respiratory status: spontaneous breathing, nonlabored ventilation, respiratory function stable and patient connected to nasal cannula oxygen Cardiovascular status: stable and blood pressure returned to baseline Postop Assessment: no apparent nausea or vomiting Anesthetic complications: no   No notable events documented.  Last Vitals:  Vitals:   10/19/20 1215 10/19/20 1230  BP: (!) 101/55 (!) 111/96  Pulse: 76 74  Resp: 15 17  Temp: 36.6 C 36.7 C  SpO2: 100% 100%    Last Pain:  Vitals:   10/19/20 1230  TempSrc:   PainSc: 0-No pain                 Belenda Cruise P Cree Napoli

## 2020-10-19 NOTE — Plan of Care (Signed)
  Problem: Clinical Measurements: Goal: Will remain free from infection Outcome: Progressing Goal: Diagnostic test results will improve Outcome: Progressing   

## 2020-10-19 NOTE — Op Note (Signed)
[facility] Patient Name: Stacie Greene Procedure Date : 10/19/2020 MRN: [id number] Attending MD: Mauri Pole , MD Date of Birth: Feb 22, 1976 CSN: [id number] Age: 45 Admit Type: Inpatient Procedure:                Colonoscopy Indications:              Evaluation of unexplained GI bleeding presenting                            with Hematochezia, Unexplained iron deficiency                            anemia Providers:                Mauri Pole, MD, Particia Nearing, RN, Benetta Spar, Technician Referring MD:              Medicines:                Monitored Anesthesia Care Complications:            No immediate complications. Estimated Blood Loss:     Estimated blood loss: none. Procedure:                Pre-Anesthesia Assessment:                           - Prior to the procedure, a History and Physical                            was performed, and patient medications and                            allergies were reviewed. The patient's tolerance of                            previous anesthesia was also reviewed. The risks                            and benefits of the procedure and the sedation                            options and risks were discussed with the patient.                            All questions were answered, and informed consent                            was obtained. Prior Anticoagulants: The patient                            Brillinta. ASA Grade Assessment: III - A patient                            with severe systemic disease. After  reviewing the                            risks and benefits, the patient was deemed in                            satisfactory condition to undergo the procedure.                           After obtaining informed consent, the colonoscope                            was passed under direct vision. Throughout the                            procedure, the patient's blood pressure,  pulse, and                            oxygen saturations were monitored continuously. The                            [id number] Olympus pediatric colonscope                            was introduced through the anus and advanced to the                            the cecum, identified by appendiceal orifice and                            ileocecal valve. The colonoscopy was performed                            without difficulty. The patient tolerated the                            procedure well. The quality of the bowel                            preparation was adequate. The ileocecal valve,                            appendiceal orifice, and rectum were photographed. Scope In: 11:51:14 AM Scope Out: 12:10:15 PM Scope Withdrawal Time: 0 hours 10 minutes 2 seconds  Total Procedure Duration: 0 hours 19 minutes 1 second  Findings:      The perianal and digital rectal examinations were normal.      Non-bleeding internal hemorrhoids were found during retroflexion. The       hemorrhoids were small.      The exam was otherwise without abnormality. No source of lower GI bleed. Impression:               - Non-bleeding internal hemorrhoids.                           - The  examination was otherwise normal.                           - No specimens collected. Recommendation:           - Patient has a contact number available for                            emergencies. The signs and symptoms of potential                            delayed complications were discussed with the                            patient. Return to normal activities tomorrow.                            Written discharge instructions were provided to the                            patient.                           - Resume previous diet.                           - Continue present medications.                           - Repeat colonoscopy in 10 years for screening                            purposes.                            - Monitor Hgb and transfuse as needed                           - IV iron therapy during dilaysis as needed                           - Resume antiplatelet medication at prior dose                            tomorrow. Refer to managing physician for further                            adjustment of therapy.                           - GI will sign off, please call with any questions Procedure Code(s):        --- Professional ---                           [phone], Colonoscopy, flexible; diagnostic, including  collection of specimen(s) by brushing or washing,                            when performed (separate procedure) Diagnosis Code(s):        --- Professional ---                           K64.8, Other hemorrhoids                           K92.1, Melena (includes Hematochezia)                           D50.9, Iron deficiency anemia, unspecified CPT copyright 2019 [employer]. All rights reserved. The codes documented in this report are preliminary and upon coder review may  be revised to meet current compliance requirements. Mauri Pole, MD 10/19/2020 12:19:25 PM This report has been signed electronically. Number of Addenda: 0

## 2020-10-19 NOTE — Anesthesia Preprocedure Evaluation (Signed)
Anesthesia Evaluation  Patient identified by MRN, date of birth, ID band Patient awake    Reviewed: Allergy & Precautions, NPO status , Patient's Chart, lab work & pertinent test results  Airway Mallampati: II  TM Distance: >3 FB Neck ROM: Full    Dental  (+) Teeth Intact   Pulmonary neg pulmonary ROS, former smoker,    Pulmonary exam normal        Cardiovascular hypertension, Pt. on medications and Pt. on home beta blockers + CAD, + Past MI and + Cardiac Stents (12/21)   Rhythm:Regular Rate:Normal     Neuro/Psych negative neurological ROS  negative psych ROS   GI/Hepatic negative GI ROS, Neg liver ROS,   Endo/Other  negative endocrine ROS  Renal/GU ESRF and DialysisRenal disease  negative genitourinary   Musculoskeletal negative musculoskeletal ROS (+)   Abdominal (+)  Abdomen: soft. Bowel sounds: normal.  Peds  Hematology  (+) anemia ,   Anesthesia Other Findings   Reproductive/Obstetrics                            Anesthesia Physical Anesthesia Plan  ASA: 3  Anesthesia Plan: MAC   Post-op Pain Management:    Induction: Intravenous  PONV Risk Score and Plan: 2 and Propofol infusion and Treatment may vary due to age or medical condition  Airway Management Planned: Simple Face Mask, Natural Airway and Nasal Cannula  Additional Equipment: None  Intra-op Plan:   Post-operative Plan:   Informed Consent: I have reviewed the patients History and Physical, chart, labs and discussed the procedure including the risks, benefits and alternatives for the proposed anesthesia with the patient or authorized representative who has indicated his/her understanding and acceptance.     Dental advisory given  Plan Discussed with: CRNA  Anesthesia Plan Comments: (Lab Results      Component                Value               Date                      WBC                      12.7 (H)             10/19/2020                HGB                      10.3 (L)            10/19/2020                HCT                      29.6 (L)            10/19/2020                MCV                      87.1                10/19/2020                PLT  178                 10/19/2020           Lab Results      Component                Value               Date                      NA                       136                 10/19/2020                K                        3.4 (L)             10/19/2020                CO2                      22                  10/19/2020                GLUCOSE                  89                  10/19/2020                BUN                      49 (H)              10/19/2020                CREATININE               7.12 (H)            10/19/2020                CALCIUM                  7.4 (L)             10/19/2020                GFRNONAA                 7 (L)               10/19/2020          )        Anesthesia Quick Evaluation

## 2020-10-19 NOTE — Progress Notes (Signed)
[facility] Progress Note   Subjective:   Patient seen and examined at bedside.  Completed majority of prep for colonoscopy.  Did not go to HD last night due to feeling sick from the prep and did not want to make it worse with dialysis.  Feeling ok this AM. Denies CP, SOB, n/v/d, abdominal pain, weakness, dizziness, fatigue and bleeding.   Objective Vitals:   10/19/20 0010 10/19/20 0427 10/19/20 0640 10/19/20 0729  BP: 140/73 121/63  130/70  Pulse: 76 79  80  Resp: '13 16 15 15  '$ Temp: 97.9 F (36.6 C) 98.8 F (37.1 C)  98.6 F (37 C)  TempSrc: Oral Oral  Oral  SpO2: 100% 100%  100%  Weight:   68.5 kg   Height:       Physical Exam General:well appearing female in NAD Heart:RRR, no mrg Lungs:CTAB, nml WOB Abdomen:soft, NTND Extremities:no LE edema Dialysis Access: [facility]   Filed Weights   10/16/20 1905 10/18/20 0902 10/19/20 0640  Weight: 70.3 kg 70.3 kg 68.5 kg    Intake/Output Summary (Last 24 hours) at 10/19/2020 1119 Last data filed at 10/19/2020 0055 Gross per 24 hour  Intake 63.51 ml  Output --  Net 63.51 ml    Additional Objective Labs: Basic Metabolic Panel: Recent Labs  Lab 10/17/20 0820 10/18/20 0519 10/19/20 0002  NA 140 136 136  K 5.5* 3.4* 3.4*  CL 115* 101 100  CO2 13* 24 22  GLUCOSE 100* 107* 89  BUN 139* 42* 49*  CREATININE 10.77* 5.49* 7.12*  CALCIUM 8.1* 7.7* 7.4*  PHOS  --  4.9* 5.1*   Liver Function Tests: Recent Labs  Lab 10/16/20 1714 10/18/20 0519 10/19/20 0002  AST 11*  --   --   ALT 18  --   --   ALKPHOS 52  --   --   BILITOT 0.5  --   --   PROT 7.0  --   --   ALBUMIN 3.9 3.5 3.4*   Recent Labs  Lab 10/16/20 1714  LIPASE 47   CBC: Recent Labs  Lab 10/16/20 1714 10/17/20 0820 10/17/20 2223 10/18/20 0519 10/18/20 1500 10/18/20 2249 10/19/20 0617  WBC 19.4* 19.7* 19.7* 16.8*  --   --  12.7*  NEUTROABS 15.6*  --   --   --   --   --   --   HGB 4.1* 4.9* 7.1* 11.0* 10.7* 10.6* 10.3*  HCT 13.6* 15.4*  20.3* 31.3* 30.5* 30.6* 29.6*  MCV 94.4 91.1 86.4 85.5  --   --  87.1  PLT 355 238 254 195  --   --  178    Iron Studies:  Recent Labs    10/18/20 0519  IRON 227*  TIBC 305  FERRITIN 270    Medications:  sodium chloride 20 mL/hr at 10/18/20 2143   promethazine (PHENERGAN) injection (IM or IVPB) 12.5 mg (10/18/20 0513)    sodium chloride   Intravenous Once   sodium chloride   Intravenous Once   atorvastatin  40 mg Oral q1800   carvedilol  6.25 mg Oral BID WC   Chlorhexidine Gluconate Cloth  6 each Topical Q0600   darbepoetin (ARANESP) injection - DIALYSIS  200 mcg Intravenous Q Thu-HD   pantoprazole (PROTONIX) IV  40 mg Intravenous Q12H    Dialysis Orders: MWF-[facility] [place] ([place]) 2hrs BFR: 200 DFR: 500 EDW: 71.5kg 3K, 2.5Ca L AVF   Heparin 2,000 unit bolus with HD treatments Hectorol 40mg IV  3x/weekly-last dose 09/23/20 Micera 159mg IV Q2wks-last dose 08/14/20 Previously ordered Venofer '100mg'$  IV X 10 doses at AF but never received since not coming to treatment.     Assessment/ Plan: Acute/ chronic blood loss anemia - baseline Hgb around 9-10. Hgb 4.4 on admit.  Transfused 2u prbc's 6/30 and Hgb now 10.3. FOBT+. 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  Plan for colonoscopy today.  ESRD - on HD MWF. Has missed total 10 HD txs (last HD 09/23/20)-due to work. HD 6/30. Refused HD yesterday d/t feeling sick from prep.  Volume and labs stable. Plan for HD tonight or tomorrow.  HTN - BP soft. Home coreg on hold.  Volume - Does not appear volume overloaded. Run even on HD tonight. Anemia ckd - see above-last ESA dose given OP in April.  ESA not given with last HD. Order with HD today.  Tsat 74%, no indication for IV iron. 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 Continue VDRA.  Nutrition: Renal diet w/fluid restrictions once eating. Alb 3.5  LJen Mow PA-C [facility] 10/19/2020,11:19 AM  LOS: 3  days

## 2020-10-20 LAB — BPAM RBC
Blood Product Expiration Date: 202207262359
Blood Product Expiration Date: 202207272359
Blood Product Expiration Date: 202207272359
ISSUE DATE / TIME: 202206300107
ISSUE DATE / TIME: 202206301329
Unit Type and Rh: 6200
Unit Type and Rh: 6200
Unit Type and Rh: 6200

## 2020-10-20 LAB — TYPE AND SCREEN
ABO/RH(D): A POS
Antibody Screen: NEGATIVE
Unit division: 0
Unit division: 0
Unit division: 0

## 2020-10-20 LAB — RENAL FUNCTION PANEL
Albumin: 3.1 g/dL — ABNORMAL LOW (ref 3.5–5.0)
Anion gap: 7 (ref 5–15)
BUN: 19 mg/dL (ref 6–20)
CO2: 25 mmol/L (ref 22–32)
Calcium: 7.2 mg/dL — ABNORMAL LOW (ref 8.9–10.3)
Chloride: 104 mmol/L (ref 98–111)
Creatinine, Ser: 4.45 mg/dL — ABNORMAL HIGH (ref 0.44–1.00)
GFR, Estimated: 12 mL/min — ABNORMAL LOW (ref >60)
Glucose, Bld: 154 mg/dL — ABNORMAL HIGH (ref 70–99)
Phosphorus: 2.8 mg/dL (ref 2.5–4.6)
Potassium: 3.5 mmol/L (ref 3.5–5.1)
Sodium: 136 mmol/L (ref 135–145)

## 2020-10-20 LAB — CBC
HCT: 28.5 % — ABNORMAL LOW (ref 36.0–46.0)
Hemoglobin: 9.8 g/dL — ABNORMAL LOW (ref 12.0–15.0)
MCH: 30.3 pg (ref 26.0–34.0)
MCHC: 34.4 g/dL (ref 30.0–36.0)
MCV: 88.2 fL (ref 80.0–100.0)
Platelets: 196 10*3/uL (ref 150–400)
RBC: 3.23 MIL/uL — ABNORMAL LOW (ref 3.87–5.11)
RDW: 15 % (ref 11.5–15.5)
WBC: 9.9 10*3/uL (ref 4.0–10.5)
nRBC: 0.2 % (ref 0.0–0.2)

## 2020-10-20 LAB — HEMOGLOBIN AND HEMATOCRIT, BLOOD
HCT: 29.3 % — ABNORMAL LOW (ref 36.0–46.0)
Hemoglobin: 9.9 g/dL — ABNORMAL LOW (ref 12.0–15.0)

## 2020-10-20 LAB — MAGNESIUM: Magnesium: 1.8 mg/dL (ref 1.7–2.4)

## 2020-10-20 MED ORDER — DARBEPOETIN ALFA 200 MCG/0.4ML IJ SOSY: 200.0000 ug | PREFILLED_SYRINGE | INTRAMUSCULAR | Status: DC

## 2020-10-20 MED ORDER — ASPIRIN EC 81 MG PO TBEC
81.0000 mg | DELAYED_RELEASE_TABLET | Freq: Every day | ORAL | Status: DC
  Administered 2020-10-20: 81 mg via ORAL
  Filled 2020-10-20: qty 1

## 2020-10-20 MED ORDER — TICAGRELOR 90 MG PO TABS
90.0000 mg | ORAL_TABLET | Freq: Two times a day (BID) | ORAL | Status: DC
  Administered 2020-10-20: 90 mg via ORAL
  Filled 2020-10-20: qty 1

## 2020-10-20 NOTE — Progress Notes (Signed)
[facility] Progress Note   Subjective:   Patient seen and examined at bedside.  Feeling ok today. Apprehensive about restarting Brilinta/ASA. Discussed no bleeding noted on EGD 7/1 or colonoscopy yesterday, hgb stable post transfusion and she reports no bleeding or melena.  Denies CP, SOB, n/v/d, abdominal pain, weakness and fatigue. Dialysis tolerated well yesterday with no nausea, dizziness or cramping.   Objective Vitals:   10/19/20 2208 10/19/20 2300 10/20/20 0426 10/20/20 0732  BP: 119/60 126/79 110/64 124/73  Pulse: 70 74 87 83  Resp: '14  16 14  '$ Temp: 98.6 F (37 C) 98.3 F (36.8 C) 98.7 F (37.1 C) 98.2 F (36.8 C)  TempSrc: Oral Oral Oral Oral  SpO2: 100% 100% 100% 100%  Weight:   68.4 kg   Height:       Physical Exam General:well appearing female in NAD Heart:RRR, no mrg Lungs:CTAB, nml WOB Abdomen:soft, NTND Extremities:no LE edema Dialysis Access: [facility]   Filed Weights   10/19/20 0640 10/19/20 1900 10/20/20 0426  Weight: 68.5 kg 71 kg 68.4 kg    Intake/Output Summary (Last 24 hours) at 10/20/2020 0955 Last data filed at 10/19/2020 2208 Gross per 24 hour  Intake 340 ml  Output 0 ml  Net 340 ml    Additional Objective Labs: Basic Metabolic Panel: Recent Labs  Lab 10/18/20 0519 10/19/20 0002 10/20/20 0026  NA 136 136 136  K 3.4* 3.4* 3.5  CL 101 100 104  CO2 '24 22 25  '$ GLUCOSE 107* 89 154*  BUN 42* 49* 19  CREATININE 5.49* 7.12* 4.45*  CALCIUM 7.7* 7.4* 7.2*  PHOS 4.9* 5.1* 2.8   Liver Function Tests: Recent Labs  Lab 10/16/20 1714 10/18/20 0519 10/19/20 0002 10/20/20 0026  AST 11*  --   --   --   ALT 18  --   --   --   ALKPHOS 52  --   --   --   BILITOT 0.5  --   --   --   PROT 7.0  --   --   --   ALBUMIN 3.9 3.5 3.4* 3.1*   Recent Labs  Lab 10/16/20 1714  LIPASE 47   CBC: Recent Labs  Lab 10/16/20 1714 10/17/20 0820 10/17/20 2223 10/18/20 0519 10/18/20 1500 10/19/20 0617 10/19/20 1422 10/20/20 0026  10/20/20 0515  WBC 19.4* 19.7* 19.7* 16.8*  --  12.7*  --   --  9.9  NEUTROABS 15.6*  --   --   --   --   --   --   --   --   HGB 4.1* 4.9* 7.1* 11.0*   < > 10.3* 10.8* 9.9* 9.8*  HCT 13.6* 15.4* 20.3* 31.3*   < > 29.6* 31.5* 29.3* 28.5*  MCV 94.4 91.1 86.4 85.5  --  87.1  --   --  88.2  PLT 355 238 254 195  --  178  --   --  196   < > = values in this interval not displayed.    Medications:  promethazine (PHENERGAN) injection (IM or IVPB) 12.5 mg (10/18/20 0513)    sodium chloride   Intravenous Once   sodium chloride   Intravenous Once   aspirin EC  81 mg Oral Daily   atorvastatin  40 mg Oral q1800   carvedilol  6.25 mg Oral BID WC   Chlorhexidine Gluconate Cloth  6 each Topical Q0600   darbepoetin (ARANESP) injection - DIALYSIS  200 mcg Intravenous Q Sat-HD  heparin sodium (porcine)       pantoprazole (PROTONIX) IV  40 mg Intravenous Q12H   ticagrelor  90 mg Oral BID    Dialysis Orders: MWF-[facility] ([place]) 2hrs BFR: 200 DFR: 500 EDW: 71.5kg 3K, 2.5Ca L AVF   Heparin 2,000 unit bolus with HD treatments Hectorol 34mg IV 3x/weekly-last dose 09/23/20 Micera 1511m IV Q2wks-last dose 08/14/20 Previously ordered Venofer '100mg'$  IV X 10 doses at AF but never received since not coming to treatment.     Assessment/ Plan: Acute/ chronic blood loss anemia - baseline Hgb around 9-10. Hgb 4.4 on admit.  Now stable at 9.8-10 s/p 5 units pRBC with last on 10/18/20.  FOBT+. 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  Colonoscopy with non bleeding hemorroids and otherwise normal.  GI recommend restarting DAPT.  ESRD - on HD MWF. Has missed total 10 HD txs (last HD 09/23/20)-due to work. HD 6/30 and last night. Next HD 7/4 per regular schedule.  No Heparin. Can be completed at outpatient unit if medically stable for d/c. HTN - BP well controlled without antihypertensives.  Volume - Does not appear volume overloaded. Minimal UF  with HD. Anemia ckd - see above-last ESA dose given OP in April.  ESA not given with HD last night.  Will order with HD tomorrow.  Tsat 74%, no indication for IV iron. 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 Continue VDRA.  Nutrition: Renal diet w/fluid restrictions.  Alb 3.5  Dispo - ok for d/c from renal standpoint.  LiJen MowPA-C [facility] 10/20/2020,9:55 AM  LOS: 4 days

## 2020-10-20 NOTE — Progress Notes (Signed)
Discharge summary reviewed.  Medication regimen reviewed.  CCMD notified.  Access discontinued.  Assisted to private vehicle.

## 2020-10-20 NOTE — Discharge Summary (Signed)
Physician Discharge Summary  Stacie Greene [id number] DOB: 1975/09/24 DOA: 10/16/2020  PCP: Merryl Hacker, No  Admit date: 10/16/2020 Discharge date: 10/20/2020  Admitted From: home Discharge disposition: home   Recommendations for Outpatient Follow-Up:   Cbc 1 week Needs DAPT for 1 year (December)   Discharge Diagnosis:   Principal Problem:   Acute blood loss anemia Active Problems:   End stage renal disease on dialysis [facility])   CAD S/P percutaneous coronary angioplasty   Essential hypertension   Dyslipidemia, goal LDL below 70   Leukocytosis   Gastrointestinal hemorrhage   Symptomatic anemia    Discharge Condition: Improved.  Diet recommendation: renal  Wound care: None.  Code status: Full.   History of Present Illness:   Stacie Greene is a 45 y.o. 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   For the past day and a half she has noticed that every time she gets up she has been feeling dizzy.  Also has been feeling nauseous.  Denies any vomiting or abdominal pain.  Has also noticed some dark stool in the past day with no bright red blood per rectum.  No weight loss. 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  States she has plans to eventually get home PD in order for dialysis to work better with her work schedule. Her hemoglobin in the ED was found to be 4.4 from a baseline of around 9.  Denies any EPO shots with her dialysis.  States she is never had blood transfusion. Last aspirin and brilinta dose yesterday.Denies NSAIDS or ETOH use.   BP stable.Leukocytosis of 19.4 from chronic leukocytosis baseline of 13-14. FOBT positive. Cr stable at 10.66 which is around her baseline.   ED physician Dr. Alvino Chapel  discussed with Dr. Johnney Ou from nephrology who will see her in consultation at [facility] and no emergent plans for dialysis at this time. Hospitalist called for admission.   Hospital Course by Problem:   Symptomatic acute blood loss anemia on anemia of chronic disease: on DAPT for DES stent placed in 03/2020.  EGD and Colo as above.  Hgb up from 4.1 to 11 after 3 units and dialysis.  Recent Labs (within last 365 days)              Recent Labs    03/29/20 0338 03/30/20 0145 04/02/20 0258 10/16/20 1714 10/17/20 0820 10/17/20 2223 10/18/20 0519 10/18/20 1500 10/18/20 2249 10/19/20 0617  HGB 9.9* 9.4* 9.0* 4.1* 4.9* 7.1* 11.0* 10.7* 10.6* 10.3*    -Appreciate GI recs             -Resume diet. -Okay to restart DAPT on 7/3.  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 Bone mineral disorder Hyperkalemia: Resolved. Hypokalemia: 3.4 -Nephrology managing   Hx of CAD s/p DES to LAD 03/29/2020: No anginal symptoms. -Okay to resume DAPT on 7/3 per GI    Hyperlipidemia -continue statin   Essential hypertension: Normotensive. -Continue home Coreg -Fluid management by HD   Bacteriuria/pyuria: No UTI symptoms. -No indication for antibiotics.    Medical Consultants:    Nephrology   Discharge Exam:  Vitals:   10/20/20 0426 10/20/20 0732  BP: 110/64 124/73  Pulse: 87 83  Resp: 16 14  Temp: 98.7 F (37.1 C) 98.2 F (36.8 C)  SpO2: 100% 100%   Vitals:   10/19/20 2208 10/19/20 2300 10/20/20 0426 10/20/20 0732  BP: 119/60 126/79 110/64 124/73  Pulse: 70 74 87 83  Resp: '14  16 14  '$ Temp: 98.6 F (37 C) 98.3 F (36.8 C) 98.7 F (37.1 C) 98.2 F (36.8 C)  TempSrc: Oral Oral Oral Oral  SpO2: 100% 100% 100% 100%  Weight:   68.4 kg   Height:        General exam: Appears calm and comfortable.   The results of significant diagnostics from this hospitalization (including imaging, microbiology, ancillary and  laboratory) are listed below for reference.     Procedures and Diagnostic Studies:   DG Chest 1 View  Result Date: 10/16/2020 CLINICAL DATA:  45 year old female with abdominal pain. EXAM: CHEST  1 VIEW COMPARISON:  Chest radiograph dated 03/27/2020 FINDINGS: Right-sided dialysis catheter with tip at the cavoatrial junction. No focal consolidation, pleural effusion, or pneumothorax. The cardiac silhouette is within limits. Coronary vascular stent. No acute osseous pathology. IMPRESSION: No active cardiopulmonary disease. Electronically Signed   By: Anner Crete M.D.   On: 10/16/2020 16:08     Labs:   Basic Metabolic Panel: Recent Labs  Lab 10/16/20 1714 10/17/20 0820 10/18/20 0519 10/19/20 0002 10/19/20 0617 10/20/20 0026 10/20/20 0515  NA 137 140 136 136  --  136  --   K 5.1 5.5* 3.4* 3.4*  --  3.5  --   CL 113* 115* 101 100  --  104  --   CO2 12* 13* 24 22  --  25  --   GLUCOSE 127* 100* 107* 89  --  154*  --   BUN 118* 139* 42* 49*  --  19  --   CREATININE 10.66* 10.77* 5.49* 7.12*  --  4.45*  --   CALCIUM 8.6* 8.1* 7.7* 7.4*  --  7.2*  --   MG  --   --   --   --  1.7  --  1.8  PHOS  --   --  4.9* 5.1*  --  2.8  --    GFR Estimated Creatinine Clearance: 15.7 mL/min (A) (by C-G formula based on SCr of 4.45 mg/dL (H)). Liver Function Tests: Recent Labs  Lab 10/16/20 1714 10/18/20 0519 10/19/20 0002 10/20/20 0026  AST 11*  --   --   --   ALT 18  --   --   --   ALKPHOS 52  --   --   --   BILITOT 0.5  --   --   --   PROT 7.0  --   --   --   ALBUMIN 3.9 3.5 3.4* 3.1*   Recent Labs  Lab 10/16/20 1714  LIPASE 47   No results for input(s): AMMONIA in the last 168 hours. Coagulation profile No results for input(s): INR, PROTIME in the last 168 hours.  CBC: Recent Labs  Lab 10/16/20 1714 10/17/20 0820 10/17/20 2223 10/18/20 0519 10/18/20 1500 10/18/20 2249 10/19/20 0617 10/19/20 1422 10/20/20 0026 10/20/20 0515  WBC 19.4* 19.7* 19.7* 16.8*  --   --   12.7*  --   --  9.9  NEUTROABS 15.6*  --   --   --   --   --   --   --   --   --  HGB 4.1* 4.9* 7.1* 11.0*   < > 10.6* 10.3* 10.8* 9.9* 9.8*  HCT 13.6* 15.4* 20.3* 31.3*   < > 30.6* 29.6* 31.5* 29.3* 28.5*  MCV 94.4 91.1 86.4 85.5  --   --  87.1  --   --  88.2  PLT 355 238 254 195  --   --  178  --   --  196   < > = values in this interval not displayed.   Cardiac Enzymes: No results for input(s): CKTOTAL, CKMB, CKMBINDEX, TROPONINI in the last 168 hours. BNP: Invalid input(s): POCBNP CBG: No results for input(s): GLUCAP in the last 168 hours. D-Dimer No results for input(s): DDIMER in the last 72 hours. Hgb A1c No results for input(s): HGBA1C in the last 72 hours. Lipid Profile No results for input(s): CHOL, HDL, LDLCALC, TRIG, CHOLHDL, LDLDIRECT in the last 72 hours. Thyroid function studies No results for input(s): TSH, T4TOTAL, T3FREE, THYROIDAB in the last 72 hours.  Invalid input(s): FREET3 Anemia work up Recent Labs    10/18/20 0519  FERRITIN 270  TIBC 305  IRON 227*   Microbiology Recent Results (from the past 240 hour(s))  Resp Panel by RT-PCR (Flu A&B, Covid) Nasopharyngeal Swab     Status: None   Collection Time: 10/17/20  1:01 AM   Specimen: Nasopharyngeal Swab; Nasopharyngeal(NP) swabs in vial transport medium  Result Value Ref Range Status   SARS Coronavirus 2 by RT PCR NEGATIVE NEGATIVE Final    Comment: (NOTE) SARS-CoV-2 target nucleic acids are NOT DETECTED.  The SARS-CoV-2 RNA is generally detectable in upper respiratory specimens during the acute phase of infection. The lowest concentration of SARS-CoV-2 viral copies this assay can detect is 138 copies/mL. A negative result does not preclude SARS-Cov-2 infection and should not be used as the sole basis for treatment or other patient management decisions. A negative result may occur with  improper specimen collection/handling, submission of specimen other than nasopharyngeal swab, presence of viral  mutation(s) within the areas targeted by this assay, and inadequate number of viral copies(<138 copies/mL). A negative result must be combined with clinical observations, patient history, and epidemiological information. The expected result is Negative.  Fact Sheet for Patients:  EntrepreneurPulse.com.au  Fact Sheet for Healthcare Providers:  IncredibleEmployment.be  This test is no t yet approved or cleared by the [place] FDA and  has been authorized for detection and/or diagnosis of SARS-CoV-2 by FDA under an Emergency Use Authorization (EUA). This EUA will remain  in effect (meaning this test can be used) for the duration of the COVID-19 declaration under Section 564(b)(1) of the Act, 21 U.S.C.section 360bbb-3(b)(1), unless the authorization is terminated  or revoked sooner.       Influenza A by PCR NEGATIVE NEGATIVE Final   Influenza B by PCR NEGATIVE NEGATIVE Final    Comment: (NOTE) The Xpert Xpress SARS-CoV-2/FLU/RSV plus assay is intended as an aid in the diagnosis of influenza from Nasopharyngeal swab specimens and should not be used as a sole basis for treatment. Nasal washings and aspirates are unacceptable for Xpert Xpress SARS-CoV-2/FLU/RSV testing.  Fact Sheet for Patients: EntrepreneurPulse.com.au  Fact Sheet for Healthcare Providers: IncredibleEmployment.be  This test is not yet approved or cleared by the [place] FDA and has been authorized for detection and/or diagnosis of SARS-CoV-2 by FDA under an Emergency Use Authorization (EUA). This EUA will remain in effect (meaning this test can be used) for the duration of the COVID-19 declaration under Section 564(b)(1) of the  Act, 21 U.S.C. section 360bbb-3(b)(1), unless the authorization is terminated or revoked.  Performed at [facility], [place] [address], [place], [place] [address]      Discharge  Instructions:   Discharge Instructions     Discharge instructions   Complete by: As directed    Renal diet   Increase activity slowly   Complete by: As directed       Allergies as of 10/20/2020   No Known Allergies      Medication List     STOP taking these medications    sodium bicarbonate 650 MG tablet       TAKE these medications    Aspirin Low Dose 81 MG EC tablet Generic drug: aspirin TAKE 1 TABLET (81 MG TOTAL) BY MOUTH DAILY. SWALLOW WHOLE.   atorvastatin 40 MG tablet Commonly known as: LIPITOR Take 1 tablet (40 mg total) by mouth daily at 6 PM.   calcitRIOL 0.25 MCG capsule Commonly known as: ROCALTROL Take 0.25 mcg by mouth every Monday, Wednesday, and Friday.   carvedilol 6.25 MG tablet Commonly known as: COREG Take 1 tablet (6.25 mg total) by mouth 2 (two) times daily with a meal.   FeroSul 325 (65 FE) MG tablet Generic drug: ferrous sulfate Take 325 mg by mouth daily.   lidocaine-prilocaine cream Commonly known as: EMLA APPLY SMALL AMOUNT TO ACCESS SITE (AVF) 1 HOUR BEFORE DIALYSIS. COVER WITH OCCLUSIVE DRESSING (SARAN WRAP)   nitroGLYCERIN 0.4 MG SL tablet Commonly known as: NITROSTAT Place 1 tablet (0.4 mg total) under the tongue every 5 (five) minutes as needed for chest pain.   ticagrelor 90 MG Tabs tablet Commonly known as: BRILINTA Take 1 tablet (90 mg total) by mouth 2 (two) times daily.   VITAMIN D PO Take 1 tablet by mouth daily.        Follow-up Information     [facility]. Call.   Why: You can use [facility] to assist in finding a doctor or call you insurance provider (or look on insurane website) to get a list of in-network providers for this area Contact information: Call- [phone] for physician referral line to assist in finding a primary care doctor        Stanford Breed, Denice Bors, MD Follow up.   Specialty: Cardiology Contact information: [address] [place] [address] [phone]                   Time coordinating discharge: 35 min  Signed:  Geradine Girt DO  [facility] 10/20/2020, 11:49 AM

## 2020-10-21 ENCOUNTER — Telehealth (HOSPITAL_COMMUNITY): Payer: Self-pay | Admitting: Nephrology

## 2020-10-21 NOTE — Telephone Encounter (Signed)
Transition of care contact from inpatient facility  Date of Discharge: 10/20/20 Date of Contact: attempted - 10/21/20 Method of contact: Phone  Attempted to contact patient to discuss transition of care from inpatient admission. Patient did not answer the phone. Message was left on the patient's voicemail with call back number [phone].   Veneta Penton, PA-C [employer] Pager [phone]

## 2020-10-22 ENCOUNTER — Telehealth (HOSPITAL_COMMUNITY): Payer: Self-pay | Admitting: Nephrology

## 2020-10-22 NOTE — Telephone Encounter (Signed)
Transition of care contact from inpatient facility  Date of Discharge: 10/20/20 Date of Contact: attempted 10/22/20 Method of contact: Phone  Attempted to contact patient to discuss transition of care from inpatient admission. Patient did not answer the phone. Message was left on the patient's voicemail with call back number [phone].   Veneta Penton, PA-C [employer] Pager [phone]

## 2021-01-04 NOTE — Progress Notes (Deleted)
HPI: Follow-up coronary artery disease.  Patient had non-ST elevation myocardial infarction December 2021.  Cardiac catheterization revealed a 99% proximal LAD with right to left collaterals, 70% mid LAD, 40% distal LAD.  Patient had PCI of the LAD with 2 drug-eluting stents.  Echocardiogram showed ejection fraction 40%.  Patient had a follow-up echocardiogram at [facility] March 2022 as she is being evaluated for renal transplant.  Her ejection fraction had improved to 55 to 60% and there was mild tricuspid regurgitation.  Stress echocardiogram March 2022 was normal. Since last seen   Current Outpatient Medications  Medication Sig Dispense Refill   aspirin 81 MG EC tablet TAKE 1 TABLET (81 MG TOTAL) BY MOUTH DAILY. SWALLOW WHOLE. 30 tablet 11   atorvastatin (LIPITOR) 40 MG tablet Take 1 tablet (40 mg total) by mouth daily at 6 PM. 90 tablet 3   calcitRIOL (ROCALTROL) 0.25 MCG capsule Take 0.25 mcg by mouth every Monday, Wednesday, and Friday.     carvedilol (COREG) 6.25 MG tablet Take 1 tablet (6.25 mg total) by mouth 2 (two) times daily with a meal. 180 tablet 3   FEROSUL 325 (65 Fe) MG tablet Take 325 mg by mouth daily.     lidocaine-prilocaine (EMLA) cream APPLY SMALL AMOUNT TO ACCESS SITE (AVF) 1 HOUR BEFORE DIALYSIS. COVER WITH OCCLUSIVE DRESSING (SARAN WRAP)     nitroGLYCERIN (NITROSTAT) 0.4 MG SL tablet Place 1 tablet (0.4 mg total) under the tongue every 5 (five) minutes as needed for chest pain. (Patient not taking: Reported on 10/16/2020) 25 tablet 3   ticagrelor (BRILINTA) 90 MG TABS tablet Take 1 tablet (90 mg total) by mouth 2 (two) times daily. 180 tablet 3   VITAMIN D PO Take 1 tablet by mouth daily.     No current facility-administered medications for this visit.     Past Medical History:  Diagnosis Date   CKD (chronic kidney disease), stage IV ([place])    Hypertension     Past Surgical History:  Procedure Laterality Date   APPENDECTOMY     COLONOSCOPY WITH PROPOFOL N/A  10/18/2020   Procedure: COLONOSCOPY WITH PROPOFOL;  Surgeon: Jackquline Denmark, MD;  Location: [place];  Service: Gastroenterology;  Laterality: N/A;   COLONOSCOPY WITH PROPOFOL N/A 10/19/2020   Procedure: COLONOSCOPY WITH PROPOFOL;  Surgeon: Mauri Pole, MD;  Location: [place] ENDOSCOPY;  Service: Endoscopy;  Laterality: N/A;   CORONARY STENT INTERVENTION N/A 03/29/2020   Procedure: CORONARY STENT INTERVENTION;  Surgeon: Burnell Blanks, MD;  Location: [place] CV LAB;  Service: Cardiovascular;  Laterality: N/A;   ESOPHAGOGASTRODUODENOSCOPY (EGD) WITH PROPOFOL N/A 10/18/2020   Procedure: ESOPHAGOGASTRODUODENOSCOPY (EGD) WITH PROPOFOL;  Surgeon: Jackquline Denmark, MD;  Location: [place];  Service: Gastroenterology;  Laterality: N/A;   HOT HEMOSTASIS N/A 10/18/2020   Procedure: HOT HEMOSTASIS (ARGON PLASMA COAGULATION/BICAP);  Surgeon: Jackquline Denmark, MD;  Location[facility] END[facility]COPY;  Service: Gastroenterology;  Laterality: N/A;   INTRAVASCULAR ULTRASOUND/IVUS N/A 03/29/2020   Procedure: Intravascular Ultrasound/IVUS;  Surgeon: Burnell Blanks, MD;  Location: [place] CV LAB;  Service: Cardiovascular;  Laterality: N/A;   LEFT HEART CATH AND CORONARY ANGIOGRAPHY N/A 03/29/2020   Procedure: LEFT HEART CATH AND CORONARY ANGIOGRAPHY;  Surgeon: Burnell Blanks, MD;  Location: [place] CV LAB;  Service: Cardiovascular;  Laterality: N/A;   THYROID SURGERY      Social History   Socioeconomic History   Marital status: Single    Spouse name: Not on file   Number of  children: Not on file   Years of education: Not on file   Highest education level: Not on file  Occupational History   Not on file  Tobacco Use   Smoking status: Former   Smokeless tobacco: Never  Vaping Use   Vaping Use: Never used  Substance and Sexual Activity   Alcohol use: Not Currently   Drug use: Never   Sexual activity: Not on file  Other Topics Concern   Not on file  Social History Narrative    Not on file   Social Determinants of Health   Financial Resource Strain: Not on file  Food Insecurity: Not on file  Transportation Needs: Not on file  Physical Activity: Not on file  Stress: Not on file  Social Connections: Not on file  Intimate Partner Violence: Not on file    No family history on file.  ROS: no fevers or chills, productive cough, hemoptysis, dysphasia, odynophagia, melena, hematochezia, dysuria, hematuria, rash, seizure activity, orthopnea, PND, pedal edema, claudication. Remaining systems are negative.  Physical Exam: Well-developed well-nourished in no acute distress.  Skin is warm and dry.  HEENT is normal.  Neck is supple.  Chest is clear to auscultation with normal expansion.  Cardiovascular exam is regular rate and rhythm.  Abdominal exam nontender or distended. No masses palpated. Extremities show no edema. neuro grossly intact  ECG- personally reviewed  A/P  1 coronary artery disease-patient doing well with no chest pain.  Plan to continue aspirin, Brilinta and statin.  Discontinue Brilinta in December.  2 ischemic cardiomyopathy-follow-up echocardiogram at [facility] showed improvement in LV function now normal.  Continue carvedilol.  3 hyperlipidemia-continue statin.  4 hypertension-patient's blood pressure is controlled.  Continue present medications and follow.  5 end-stage renal disease-Per nephrology.  Kirk Ruths, MD

## 2021-01-07 ENCOUNTER — Ambulatory Visit: Payer: POS | Admitting: Cardiology

## 2021-05-29 ENCOUNTER — Other Ambulatory Visit: Payer: Self-pay

## 2021-05-29 MED ORDER — ATORVASTATIN CALCIUM 40 MG PO TABS: 40.0000 mg | ORAL_TABLET | Freq: Every day | ORAL | 0 refills | Status: DC

## 2021-07-11 ENCOUNTER — Telehealth: Payer: Self-pay | Admitting: Cardiology

## 2021-07-11 MED ORDER — CARVEDILOL 6.25 MG PO TABS: 6.2500 mg | ORAL_TABLET | Freq: Two times a day (BID) | ORAL | 0 refills | Status: DC

## 2021-07-11 NOTE — Telephone Encounter (Signed)
?*  STAT* If patient is at the pharmacy, call can be transferred to refill team. ? ? ?1. Which medications need to be refilled? (please list name of each medication and dose if known) carvedilol (COREG) 6.25 MG tablet ? ?2. Which pharmacy/location (including street and city if local pharmacy) is medication to be sent to? [place], [place] [facility] [place] ? ?3. Do they need a 30 day or 90 day supply? 90 ? ?

## 2021-07-11 NOTE — Telephone Encounter (Signed)
Refill sent to pharmacy.   

## 2021-07-25 ENCOUNTER — Other Ambulatory Visit: Payer: Self-pay

## 2021-07-25 ENCOUNTER — Inpatient Hospital Stay (HOSPITAL_COMMUNITY)
Admission: EM | Admit: 2021-07-25 | Discharge: 2021-07-29 | DRG: 291 | Disposition: A | Discharge status: Home / Self Care | Payer: POS | Attending: Family Medicine | Admitting: Family Medicine

## 2021-07-25 ENCOUNTER — Encounter (HOSPITAL_COMMUNITY): Payer: Self-pay | Admitting: Emergency Medicine

## 2021-07-25 ENCOUNTER — Inpatient Hospital Stay (HOSPITAL_COMMUNITY): Payer: POS

## 2021-07-25 DIAGNOSIS — Z79899 Other long term (current) drug therapy: Secondary | ICD-10-CM | POA: Diagnosis not present

## 2021-07-25 DIAGNOSIS — Z7982 Long term (current) use of aspirin: Secondary | ICD-10-CM | POA: Diagnosis not present

## 2021-07-25 DIAGNOSIS — N186 End stage renal disease: Secondary | ICD-10-CM

## 2021-07-25 DIAGNOSIS — I5042 Chronic combined systolic (congestive) and diastolic (congestive) heart failure: Secondary | ICD-10-CM

## 2021-07-25 DIAGNOSIS — Z992 Dependence on renal dialysis: Secondary | ICD-10-CM | POA: Diagnosis not present

## 2021-07-25 DIAGNOSIS — Z87891 Personal history of nicotine dependence: Secondary | ICD-10-CM | POA: Diagnosis not present

## 2021-07-25 DIAGNOSIS — E785 Hyperlipidemia, unspecified: Secondary | ICD-10-CM | POA: Diagnosis present

## 2021-07-25 DIAGNOSIS — I2510 Atherosclerotic heart disease of native coronary artery without angina pectoris: Secondary | ICD-10-CM | POA: Diagnosis present

## 2021-07-25 DIAGNOSIS — Z20822 Contact with and (suspected) exposure to covid-19: Secondary | ICD-10-CM | POA: Diagnosis present

## 2021-07-25 DIAGNOSIS — I132 Hypertensive heart and chronic kidney disease with heart failure and with stage 5 chronic kidney disease, or end stage renal disease: Principal | ICD-10-CM | POA: Diagnosis present

## 2021-07-25 DIAGNOSIS — I252 Old myocardial infarction: Secondary | ICD-10-CM

## 2021-07-25 DIAGNOSIS — Z91158 Patient's noncompliance with renal dialysis for other reason: Secondary | ICD-10-CM | POA: Diagnosis not present

## 2021-07-25 DIAGNOSIS — E782 Mixed hyperlipidemia: Secondary | ICD-10-CM | POA: Insufficient documentation

## 2021-07-25 DIAGNOSIS — Z955 Presence of coronary angioplasty implant and graft: Secondary | ICD-10-CM | POA: Diagnosis not present

## 2021-07-25 DIAGNOSIS — I255 Ischemic cardiomyopathy: Secondary | ICD-10-CM | POA: Diagnosis present

## 2021-07-25 DIAGNOSIS — Z7902 Long term (current) use of antithrombotics/antiplatelets: Secondary | ICD-10-CM | POA: Diagnosis not present

## 2021-07-25 DIAGNOSIS — N179 Acute kidney failure, unspecified: Secondary | ICD-10-CM | POA: Diagnosis present

## 2021-07-25 DIAGNOSIS — I10 Essential (primary) hypertension: Secondary | ICD-10-CM | POA: Diagnosis present

## 2021-07-25 DIAGNOSIS — D631 Anemia in chronic kidney disease: Secondary | ICD-10-CM | POA: Diagnosis present

## 2021-07-25 DIAGNOSIS — M273 Alveolitis of jaws: Secondary | ICD-10-CM | POA: Diagnosis present

## 2021-07-25 DIAGNOSIS — I5031 Acute diastolic (congestive) heart failure: Secondary | ICD-10-CM | POA: Diagnosis not present

## 2021-07-25 DIAGNOSIS — N19 Unspecified kidney failure: Principal | ICD-10-CM

## 2021-07-25 DIAGNOSIS — R112 Nausea with vomiting, unspecified: Secondary | ICD-10-CM | POA: Diagnosis not present

## 2021-07-25 DIAGNOSIS — N185 Chronic kidney disease, stage 5: Secondary | ICD-10-CM | POA: Diagnosis not present

## 2021-07-25 DIAGNOSIS — I5032 Chronic diastolic (congestive) heart failure: Secondary | ICD-10-CM

## 2021-07-25 HISTORY — DX: Hypertensive chronic kidney disease with stage 5 chronic kidney disease or end stage renal disease: I12.0

## 2021-07-25 HISTORY — DX: End stage renal disease: N18.6

## 2021-07-25 LAB — HEPATITIS B SURFACE ANTIBODY,QUALITATIVE: Hep B S Ab: REACTIVE — AB

## 2021-07-25 LAB — BASIC METABOLIC PANEL
Anion gap: 28 — ABNORMAL HIGH (ref 5–15)
BUN: 165 mg/dL — ABNORMAL HIGH (ref 6–20)
CO2: 22 mmol/L (ref 22–32)
Calcium: 8.2 mg/dL — ABNORMAL LOW (ref 8.9–10.3)
Chloride: 91 mmol/L — ABNORMAL LOW (ref 98–111)
Creatinine, Ser: 42.17 mg/dL — ABNORMAL HIGH (ref 0.44–1.00)
GFR, Estimated: 1 mL/min — ABNORMAL LOW (ref >60)
Glucose, Bld: 98 mg/dL (ref 70–99)
Potassium: 5.3 mmol/L — ABNORMAL HIGH (ref 3.5–5.1)
Sodium: 141 mmol/L (ref 135–145)

## 2021-07-25 LAB — ECHOCARDIOGRAM COMPLETE
S' Lateral: 2.8 cm
Single Plane A4C EF: 49.9 %

## 2021-07-25 LAB — I-STAT BETA HCG BLOOD, ED (MC, WL, AP ONLY): I-stat hCG, quantitative: <5 m[IU]/mL (ref <5)

## 2021-07-25 LAB — HEMOGLOBIN A1C
Hgb A1c MFr Bld: 6.2 % — ABNORMAL HIGH (ref 4.8–5.6)
Mean Plasma Glucose: 131.24 mg/dL

## 2021-07-25 LAB — CBC
HCT: 28.8 % — ABNORMAL LOW (ref 36.0–46.0)
Hemoglobin: 9.2 g/dL — ABNORMAL LOW (ref 12.0–15.0)
MCH: 25.9 pg — ABNORMAL LOW (ref 26.0–34.0)
MCHC: 31.9 g/dL (ref 30.0–36.0)
MCV: 81.1 fL (ref 80.0–100.0)
Platelets: 262 10*3/uL (ref 150–400)
RBC: 3.55 MIL/uL — ABNORMAL LOW (ref 3.87–5.11)
RDW: 13.4 % (ref 11.5–15.5)
WBC: 12.7 10*3/uL — ABNORMAL HIGH (ref 4.0–10.5)
nRBC: 0 % (ref 0.0–0.2)

## 2021-07-25 LAB — GLUCOSE, CAPILLARY: Glucose-Capillary: 102 mg/dL — ABNORMAL HIGH (ref 70–99)

## 2021-07-25 LAB — HEPATITIS B SURFACE ANTIGEN: Hepatitis B Surface Ag: NONREACTIVE

## 2021-07-25 MED ORDER — CARVEDILOL 6.25 MG PO TABS
6.2500 mg | ORAL_TABLET | Freq: Two times a day (BID) | ORAL | Status: DC
  Administered 2021-07-25: 6.25 mg via ORAL
  Filled 2021-07-25: qty 2

## 2021-07-25 MED ORDER — HEPARIN SODIUM (PORCINE) 5000 UNIT/ML IJ SOLN
5000.0000 [IU] | Freq: Three times a day (TID) | INTRAMUSCULAR | Status: DC
  Administered 2021-07-25 – 2021-07-29 (×10): 5000 [IU] via SUBCUTANEOUS
  Filled 2021-07-25 (×11): qty 1

## 2021-07-25 MED ORDER — SODIUM CHLORIDE 0.9 % IV BOLUS
500.0000 mL | Freq: Once | INTRAVENOUS | Status: AC
  Administered 2021-07-25: 500 mL via INTRAVENOUS

## 2021-07-25 MED ORDER — SODIUM CHLORIDE 0.9% FLUSH
3.0000 mL | INTRAVENOUS | Status: DC | PRN
  Administered 2021-07-29: 3 mL via INTRAVENOUS

## 2021-07-25 MED ORDER — CHLORHEXIDINE GLUCONATE CLOTH 2 % EX PADS
6.0000 | MEDICATED_PAD | Freq: Every day | CUTANEOUS | Status: DC
  Administered 2021-07-26 – 2021-07-28 (×3): 6 via TOPICAL

## 2021-07-25 MED ORDER — ATORVASTATIN CALCIUM 40 MG PO TABS
40.0000 mg | ORAL_TABLET | Freq: Every day | ORAL | Status: DC
  Administered 2021-07-25 – 2021-07-28 (×4): 40 mg via ORAL
  Filled 2021-07-25 (×5): qty 1

## 2021-07-25 MED ORDER — NITROGLYCERIN 0.4 MG SL SUBL: 0.4000 mg | SUBLINGUAL_TABLET | SUBLINGUAL | Status: DC | PRN

## 2021-07-25 MED ORDER — TICAGRELOR 90 MG PO TABS
90.0000 mg | ORAL_TABLET | Freq: Two times a day (BID) | ORAL | Status: DC
  Administered 2021-07-25 – 2021-07-29 (×7): 90 mg via ORAL
  Filled 2021-07-25 (×7): qty 1

## 2021-07-25 MED ORDER — SODIUM CHLORIDE 0.9 % IV SOLN: 250.0000 mL | INTRAVENOUS | Status: DC | PRN

## 2021-07-25 MED ORDER — ACETAMINOPHEN 325 MG PO TABS
650.0000 mg | ORAL_TABLET | Freq: Four times a day (QID) | ORAL | Status: DC | PRN
  Administered 2021-07-29: 650 mg via ORAL
  Filled 2021-07-25 (×2): qty 2

## 2021-07-25 MED ORDER — ACETAMINOPHEN 650 MG RE SUPP
650.0000 mg | Freq: Four times a day (QID) | RECTAL | Status: DC | PRN
  Filled 2021-07-25: qty 1

## 2021-07-25 MED ORDER — SODIUM CHLORIDE 0.9% FLUSH
3.0000 mL | Freq: Two times a day (BID) | INTRAVENOUS | Status: DC
  Administered 2021-07-25 – 2021-07-29 (×8): 3 mL via INTRAVENOUS

## 2021-07-25 MED ORDER — HYDRALAZINE HCL 20 MG/ML IJ SOLN
5.0000 mg | INTRAMUSCULAR | Status: DC | PRN
  Administered 2021-07-25: 5 mg via INTRAVENOUS
  Filled 2021-07-25: qty 1

## 2021-07-25 MED ORDER — KETOROLAC TROMETHAMINE 15 MG/ML IJ SOLN: 15.0000 mg | Freq: Four times a day (QID) | INTRAMUSCULAR | Status: DC | PRN

## 2021-07-25 MED ORDER — LABETALOL HCL 5 MG/ML IV SOLN
20.0000 mg | Freq: Once | INTRAVENOUS | Status: AC
  Administered 2021-07-25: 20 mg via INTRAVENOUS
  Filled 2021-07-25: qty 4

## 2021-07-25 MED ORDER — HEPARIN SODIUM (PORCINE) 1000 UNIT/ML DIALYSIS: 2000.0000 [IU] | INTRAMUSCULAR | Status: DC | PRN

## 2021-07-25 MED ORDER — ONDANSETRON HCL 4 MG/2ML IJ SOLN
4.0000 mg | Freq: Four times a day (QID) | INTRAMUSCULAR | Status: DC | PRN
  Administered 2021-07-25 – 2021-07-28 (×9): 4 mg via INTRAVENOUS
  Filled 2021-07-25 (×10): qty 2

## 2021-07-25 NOTE — Subjective & Objective (Signed)
Pleasant 46 y/o with chronic hypertension with related renal failure. Has been on HD but last session at Thanksgiving. She had scheduling issues with work and wanted peritoneal HD that was never set up. She has also started transplant evaluation but never completed. 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 She continues on Brilinta. Last echo in [place] from 2021 with EF 40 % akinesis inferior and steptal wsl. Grade I DDD. She reports she may have had repeat echo. She reports adherence to to BP medication table. She does not have PCP or primary nephrologist. ? ?For several days she has felt bad, weak and had increased N/V. She presents to [facility] for evaluation. ?

## 2021-07-25 NOTE — Assessment & Plan Note (Addendum)
Stated HD 1 year ago, was at outpt HD center for several months but had to stop because it interfered with her work schedule.  Went about 5-6 months without HD and presented here with BUN 170 mmol/L (and actually Cr >40 mg/dL but surprisingly no acidosis or hyperkalemia or fluid overload).   ? ?Now finished one HD session yesterday, repeat again today. ?- SW consult to re-establish HD center ?- Nephrology consult for routine HD  ? ? ? ?

## 2021-07-25 NOTE — Assessment & Plan Note (Signed)
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 Now f/o Echo found. Now with mild elevation in BNP, likely due to fluid overload related to ESRD. No chest pain. No acute findings on EKG ? ?Plan Continue home cardiac meds ? HD ? 2 D Echo ?

## 2021-07-25 NOTE — ED Provider Triage Note (Signed)
Emergency Medicine Provider Triage Evaluation Note ? ?Stacie Greene , a 46 y.o. female  was evaluated in triage.  Pt complains of generalized weakness for the past week. Hx CKD on dialysis, states that she has not completed dialysis since November 2022 due to "scheduling issues." States she called to request peritoneal dialysis which they were unable to provide for her and she subsequently stopped going.  States she has been on dialysis for approximately 1 year.  States that she is felt normally until 1 week ago. Also endorses some nausea and vomiting that began yesterday. Denies any pain. Patient states that she was having dialysis from her left wrist ? ?Review of Systems  ?Positive:  ?Negative: Chest pain, shortness of breath ? ?Physical Exam  ?There were no vitals taken for this visit. ?Gen:   Awake, no distress   ?Resp:  Normal effort  ?MSK:   Moves extremities without difficulty  ?Other:   ? ?Medical Decision Making  ?Medically screening exam initiated at 11:11 AM.  Appropriate orders placed.  Cloris Minich was informed that the remainder of the evaluation will be completed by another provider, this initial triage assessment does not replace that evaluation, and the importance of remaining in the ED until their evaluation is complete. ? ? ?  ?Bud Face, PA-C ?07/25/21 1128 ? ?

## 2021-07-25 NOTE — Assessment & Plan Note (Addendum)
Blood pressure normalized ?- Continue carvedilol, hydral ?- Add amlodipine ?

## 2021-07-25 NOTE — H&P (Addendum)
?History and Physical  ? ? ?Stacie Greene IWP:[id number] DOB: Nov 30, 1975 DOA: 07/25/2021 ? ?DOS: the patient was seen and examined on 07/25/2021 ? ?PCP: Pcp, No  ? ?Patient coming from: Home ? ?I have personally briefly reviewed patient's old medical records in [place] ? ?Pleasant 46 y/o with chronic hypertension with related renal failure. Has been on HD but last session at Thanksgiving. She had scheduling issues with work and wanted peritoneal HD that was never set up. She has also started transplant evaluation but never completed. 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 She continues on Brilinta. Last echo in [place] from 2021 with EF 40 % akinesis inferior and steptal wsl. Grade I DDD. She reports she may have had repeat echo. She reports adherence to to BP medication table. She does not have PCP or primary nephrologist. ? ?For several days she has felt bad, weak and had increased N/V. She presents to [facility] for evaluation.  ? ?ED Course: T97.7  189/110  HR 83  RR 16 No distress ? Lab: notable for K 5.3  BUN 165, Cr 4.3  Alb 3.1 AST 11, BNP 167  . EKG w/o acute changes ? ?Review of Systems:  ?Review of Systems  ?Constitutional:  Positive for malaise/fatigue.  ?HENT: Negative.    ?Eyes: Negative.   ?Respiratory: Negative.    ?Cardiovascular: Negative.   ?Gastrointestinal: Negative.   ?Genitourinary: Negative.   ?Musculoskeletal: Negative.   ?Skin: Negative.   ?Neurological: Negative.   ?Endo/Heme/Allergies: Negative.   ?Psychiatric/Behavioral: Negative.    ? ?Past Medical History:  ?Diagnosis Date  ? CKD (chronic kidney disease), stage IV ([place])   ? Hypertension   ? ? ?Past Surgical History:  ?Procedure Laterality Date  ? APPENDECTOMY    ? COLONOSCOPY WITH PROPOFOL N/A 10/18/2020  ? Procedure: COLONOSCOPY WITH PROPOFOL;  Surgeon: Jackquline Denmark, MD;  Location: [facility] ENDOSCOPY;  Service: Gastroenterology;  Laterality: N/A;  ? COLONOSCOPY WITH PROPOFOL N/A 10/19/2020  ? Procedure: COLONOSCOPY WITH  PROPOFOL;  Surgeon: Mauri Pole, MD;  Location: [facility] ENDOSCOPY;  Service: Endoscopy;  Laterality: N/A;  ? CORONARY STENT INTERVENTION N/A 03/29/2020  ? Procedure: CORONARY STENT INTERVENTION;  Surgeon: Burnell Blanks, MD;  Location: [facility] CV LAB;  Service: Cardiovascular;  Laterality: N/A;  ? ESOPHAGOGASTRODUODENOSCOPY (EGD) WITH PROPOFOL N/A 10/18/2020  ? Procedure: ESOPHAGOGASTRODUODENOSCOPY (EGD) WITH PROPOFOL;  Surgeon: Jackquline Denmark, MD;  Location: [facility];  Service: Gastroenterology;  Laterality: N/A;  ? HOT HEMOSTASIS N/A 10/18/2020  ? Procedure: HOT HEMOSTASIS (ARGON PLASMA COAGULATION/BICAP);  Surgeon: Jackquline Denmark, MD;  Location: [facility] ENDOSCOPY;  Service: Gastroenterology;  Laterality: N/A;  ? INTRAVASCULAR ULTRASOUND/IVUS N/A 03/29/2020  ? Procedure: Intravascular Ultrasound/IVUS;  Surgeon: Burnell Blanks, MD;  Location: [place] CV LAB;  Service: Cardiovascular;  Laterality: N/A;  ? LEFT HEART CATH AND CORONARY ANGIOGRAPHY N/A 03/29/2020  ? Procedure: LEFT HEART CATH AND CORONARY ANGIOGRAPHY;  Surgeon: Burnell Blanks, MD;  Location: [place] CV LAB;  Service: Cardiovascular;  Laterality: N/A;  ? THYROID SURGERY    ? ? ?Soc Hx - never married. Two sons: 67 and 46. Works for [employer]. Lives alone.  ? ? reports that she has quit smoking. She has never used smokeless tobacco. She reports that she does not currently use alcohol. She reports that she does not use drugs. ? ?No Known Allergies ? ?No family history on file. ? ?Prior to Admission medications   ?Medication Sig Start Date End Date Taking?  Authorizing Provider  ?aspirin EC 81 MG tablet Take 81 mg by mouth daily. Swallow whole.   Yes [provider]  ?atorvastatin (LIPITOR) 40 MG tablet Take 1 tablet (40 mg total) by mouth daily at 6 PM. Schedule an appointment with cardiology for further refills, 1st attempt ?Patient taking differently: Take 40 mg by mouth daily at 6 PM. 05/29/21  Yes Crenshaw, Denice Bors,  MD  ?carvedilol (COREG) 6.25 MG tablet Take 1 tablet (6.25 mg total) by mouth 2 (two) times daily with a meal. 07/11/21  Yes Lelon Perla, MD  ?ticagrelor (BRILINTA) 90 MG TABS tablet Take 1 tablet (90 mg total) by mouth 2 (two) times daily. 04/10/20  Yes Kilroy, Lurena Joiner K, PA-C  ?nitroGLYCERIN (NITROSTAT) 0.4 MG SL tablet Place 1 tablet (0.4 mg total) under the tongue every 5 (five) minutes as needed for chest pain. ?Patient not taking: Reported on 10/16/2020 04/10/20 10/16/20  Erlene Quan, PA-C  ? ? ?Physical Exam: ?Vitals:  ? 07/25/21 1240 07/25/21 1441 07/25/21 1515 07/25/21 1530  ?BP: (!) 160/106 (!) 189/110 (!) 196/109 (!) 199/114  ?Pulse: 93 83 74 81  ?Resp: 16 16 10 16   ?Temp: 97.7 ?F (36.5 ?C)     ?TempSrc: Oral     ?SpO2: 100% 100% 99% 100%  ? ? ?Physical Exam ?Vitals and nursing note reviewed.  ?Constitutional:   ?   Appearance: Normal appearance. She is normal weight.  ?HENT:  ?   Head: Normocephalic and atraumatic.  ?   Right Ear: External ear normal.  ?   Left Ear: External ear normal.  ?   Nose: Nose normal.  ?   Mouth/Throat:  ?   Mouth: Mucous membranes are moist.  ?   Comments: Missing several teeth. Apparent dry socket left lower molar, no obvious carries, no gingivitis or other oral lesions ?Eyes:  ?   Extraocular Movements: Extraocular movements intact.  ?   Conjunctiva/sclera: Conjunctivae normal.  ?   Pupils: Pupils are equal, round, and reactive to light.  ?Neck:  ?   Vascular: No carotid bruit.  ?Cardiovascular:  ?   Rate and Rhythm: Normal rate and regular rhythm.  ?   Pulses: Normal pulses.  ?   Heart sounds: Normal heart sounds.  ?Pulmonary:  ?   Effort: Pulmonary effort is normal.  ?   Breath sounds: Normal breath sounds. No wheezing or rales.  ?Abdominal:  ?   General: Abdomen is flat. Bowel sounds are normal.  ?   Palpations: Abdomen is soft.  ?   Tenderness: There is no abdominal tenderness. There is no guarding.  ?Musculoskeletal:     ?   General: No deformity. Normal range of  motion.  ?   Cervical back: Normal range of motion and neck supple.  ?Skin: ?   General: Skin is warm and dry.  ?Neurological:  ?   General: No focal deficit present.  ?   Mental Status: She is alert and oriented to person, place, and time. Mental status is at baseline.  ?Psychiatric:     ?   Mood and Affect: Mood normal.  ?  ? ?Labs on Admission: I have personally reviewed following labs and imaging studies ? ?CBC: ?Recent Labs  ?Lab 07/25/21 ?1126  ?WBC 12.7*  ?HGB 9.2*  ?HCT 28.8*  ?MCV 81.1  ?PLT 262  ? ?Basic Metabolic Panel: ?Recent Labs  ?Lab 07/25/21 ?1126  ?NA 141  ?K 5.3*  ?CL 91*  ?CO2 22  ?GLUCOSE 98  ?  BUN 165*  ?CREATININE 42.17*  ?CALCIUM 8.2*  ? ?GFR: ?CrCl cannot be calculated (Unknown ideal weight.). ?Liver Function Tests: ?No results for input(s): AST, ALT, ALKPHOS, BILITOT, PROT, ALBUMIN in the last 168 hours. ?No results for input(s): LIPASE, AMYLASE in the last 168 hours. ?No results for input(s): AMMONIA in the last 168 hours. ?Coagulation Profile: ?No results for input(s): INR, PROTIME in the last 168 hours. ?Cardiac Enzymes: ?No results for input(s): CKTOTAL, CKMB, CKMBINDEX, TROPONINI in the last 168 hours. ?BNP (last 3 results) ?No results for input(s): PROBNP in the last 8760 hours. ?HbA1C: ?No results for input(s): HGBA1C in the last 72 hours. ?CBG: ?No results for input(s): GLUCAP in the last 168 hours. ?Lipid Profile: ?No results for input(s): CHOL, HDL, LDLCALC, TRIG, CHOLHDL, LDLDIRECT in the last 72 hours. ?Thyroid Function Tests: ?No results for input(s): TSH, T4TOTAL, FREET4, T3FREE, THYROIDAB in the last 72 hours. ?Anemia Panel: ?No results for input(s): VITAMINB12, FOLATE, FERRITIN, TIBC, IRON, RETICCTPCT in the last 72 hours. ?Urine analysis: ?   ?Component Value Date/Time  ? [place] YELLOW 10/17/2020 0732  ? APPEARANCEUR HAZY (A) 10/17/2020 0732  ? LABSPEC 1.010 10/17/2020 0732  ? PHURINE 6.0 10/17/2020 0732  ? GLUCOSEU NEGATIVE 10/17/2020 0732  ? HGBUR SMALL (A) 10/17/2020  0732  ? [place] NEGATIVE 10/17/2020 0732  ? [place] NEGATIVE 10/17/2020 0732  ? PROTEINUR 30 (A) 10/17/2020 0732  ? NITRITE NEGATIVE 10/17/2020 0732  ? LEUKOCYTESUR LARGE (A) 10/17/2020 0732  ? ? ?[place]

## 2021-07-25 NOTE — Progress Notes (Signed)
?  Echocardiogram ?2D Echocardiogram has been performed. ? ?Johny Chess ?07/25/2021, 4:43 PM ?

## 2021-07-25 NOTE — ED Triage Notes (Signed)
Patient BIB [facility] from home with generalized weakness that started one week ago. Patient states she has not been able to coordinate her work schedule with dialysis and has not had dialysis in since before Thanksgiving 2022. 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 Patient denies pain, denies shortness of breath, is speaking in complete sentences and is in no apparent distress at this time. ?

## 2021-07-25 NOTE — Assessment & Plan Note (Signed)
Patient on Statin therapy - lipitor 40 ? ?Plan Lipid panel in AM ? Medication adjustment based on lab results ?

## 2021-07-25 NOTE — Consult Note (Signed)
Renal Service ?Consult Note ?Stacie Greene ? ?Stacie Greene ?07/25/2021 ?[name], MD ?Requesting Physician: Dr. Linda Hedges ? ?Reason for Consult: ESRD pt missed HD x 5 months ?HPI: The patient is a 46 y.o. year-old w/ hx of HTN and ESRD who was on HD but stopped going in late November 2022. She now feels "very bad" w/ N/V and fatigue and presented to ED today. BP in ED high 170/100 range. Hb 141  K 5.3  CO2 22 BUN 165  Creat 42. She is agreeable to doing HD now.  Asked to see for ESRD.   ? ?Pt seen in ED.  She says she was upset that they "wouldn't let me do home dialysis" so she stopped going.  Over the months she has been feeling bad w/ progressive fatigue, and nausea, poor appetite, bad odor, etc..Marland KitchenNow she is here and agrees to going back on HD.  Has L FA AVF.   ? ?ROS - denies CP, no joint pain, no HA, no blurry vision, no rash, no diarrhea, no nausea/ vomiting, no dysuria, no difficulty voiding ? ? ?Past Medical History  ?Past Medical History:  ?Diagnosis Date  ? CKD (chronic kidney disease), stage IV ([place])   ? Hypertension   ? ?Past Surgical History  ?Past Surgical History:  ?Procedure Laterality Date  ? APPENDECTOMY    ? COLONOSCOPY WITH PROPOFOL N/A 10/18/2020  ? Procedure: COLONOSCOPY WITH PROPOFOL;  Surgeon: Jackquline Denmark, MD;  Location: [facility] ENDOSCOPY;  Service: Gastroenterology;  Laterality: N/A;  ? COLONOSCOPY WITH PROPOFOL N/A 10/19/2020  ? Procedure: COLONOSCOPY WITH PROPOFOL;  Surgeon: Mauri Pole, MD;  Location: [facility] ENDOSCOPY;  Service: Endoscopy;  Laterality: N/A;  ? CORONARY STENT INTERVENTION N/A 03/29/2020  ? Procedure: CORONARY STENT INTERVENTION;  Surgeon: Burnell Blanks, MD;  Location: [place] [facility] LAB;  Service: Cardiovascular;  Laterality: N/A;  ? ESOPHAGOGASTRODUODENOSCOPY (EGD) WITH PROPOFOL N/A 10/18/2020  ? Procedure: ESOPHAGOGASTRODUODENOSCOPY (EGD) WITH PROPOFOL;  Surgeon: Jackquline Denmark, MD;  Location: [facility];  Service: Gastroenterology;  Laterality:  N/A;  ? HOT HEMOSTASIS N/A 10/18/2020  ? Procedure: HOT HEMOSTASIS (ARGON PLASMA COAGULATION/BICAP);  Surgeon: Jackquline Denmark, MD;  Location: [facility] ENDOSCOPY;  Service: Gastroenterology;  Laterality: N/A;  ? INTRAVASCULAR ULTRASOUND/IVUS N/A 03/29/2020  ? Procedure: Intravascular Ultrasound/IVUS;  Surgeon: Burnell Blanks, MD;  Location: [place] [facility] LAB;  Service: Cardiovascular;  Laterality: N/A;  ? LEFT HEART CATH AND CORONARY ANGIOGRAPHY N/A 03/29/2020  ? Procedure: LEFT HEART CATH AND CORONARY ANGIOGRAPHY;  Surgeon: Burnell Blanks, MD;  Location: [facility] CV LAB;  Service: Cardiovascular;  Laterality: N/A;  ? THYROID SURGERY    ? ?Family History No family history on file. ?Social History  reports that she has quit smoking. She has never used smokeless tobacco. She reports that she does not currently use alcohol. She reports that she does not use drugs. ?Allergies No Known Allergies ?Home medications ?Prior to Admission medications   ?Medication Sig Start Date End Date Taking? Authorizing Provider  ?aspirin EC 81 MG tablet Take 81 mg by mouth daily. Swallow whole.   Yes [provider]  ?atorvastatin (LIPITOR) 40 MG tablet Take 1 tablet (40 mg total) by mouth daily at 6 PM. Schedule an appointment with cardiology for further refills, 1st attempt ?Patient taking differently: Take 40 mg by mouth daily at 6 PM. 05/29/21  Yes Crenshaw, Denice Bors, MD  ?carvedilol (COREG) 6.25 MG tablet Take 1 tablet (6.25 mg total) by mouth 2 (two) times daily with a  meal. 07/11/21  Yes Lelon Perla, MD  ?ticagrelor (BRILINTA) 90 MG TABS tablet Take 1 tablet (90 mg total) by mouth 2 (two) times daily. 04/10/20  Yes Kilroy, Lurena Joiner K, PA-C  ?nitroGLYCERIN (NITROSTAT) 0.4 MG SL tablet Place 1 tablet (0.4 mg total) under the tongue every 5 (five) minutes as needed for chest pain. ?Patient not taking: Reported on 10/16/2020 04/10/20 10/16/20  Erlene Quan, PA-C  ? ? ? ?Vitals:  ? 07/25/21 1240 07/25/21 1441 07/25/21  1515 07/25/21 1530  ?BP: (!) 160/106 (!) 189/110 (!) 196/109 (!) 199/114  ?Pulse: 93 83 74 81  ?Resp: 16 16 10 16   ?Temp: 97.7 ?F (36.5 ?C)     ?TempSrc: Oral     ?SpO2: 100% 100% 99% 100%  ? ?Exam ?Gen alert, no distress, urea type odor ?No rash, cyanosis or gangrene ?Sclera anicteric, throat clear  ?No jvd or bruits ?Chest clear bilat to bases, no rales/ wheezing ?RRR no MRG ?Abd soft ntnd no mass or ascites +bs ?GU defer ?MS no joint effusions or deformity ?Ext no LE or UE edema, no wounds or ulcers ?Neuro is alert, Ox 3 , nf, looks tired ?   LFA AVF+bruit ? ? ? ? ? Home meds include - lipitor, coreg 6.25 bid, brilinta 90 bid, sl ntg prn, asa ? ? ? ? OP HD: last HD recorded [facility] June 20[facility] ? ? ?Assessment/ Plan: ?Uremia - w/ ESRD was on HD in 2022 but then stopped going. Showing up here quite uremic w/ severe fatigue and N/V. No asterixis. Would like to start dialysis again. Wants to do PD- I told her she would have to prove good compliance on existing modality (in-center HD) to be considered for any home dialysis. Plan gentle HD tomorrow am, no staff to do her today, then on Sunday or Monday and gradually get numbers down.  ?BP/ volume - BP's up, but not grossly overloaded. Pull 2 L w/ HD tomorrow. Cont home meds.  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  ?  ? ? ? ?Kelly Splinter  MD ?07/25/2021, 4:39 PM ?Recent Labs  ?Lab 07/25/21 ?1126  ?HGB 9.2*  ?CALCIUM 8.2*  ?CREATININE 42.17*  ?K 5.3*  ? ? ?

## 2021-07-25 NOTE — Progress Notes (Signed)
?   07/25/21 1845  ?What Happened  ?Was fall witnessed? Yes  ?Who witnessed fall? mother  ?Patients activity before fall ambulating-unassisted  ?Point of contact hip/leg  ?Was patient injured? No  ?Follow Up  ?MD notified Norins, MD  ?Time MD notified [phone]  ?Family notified Yes - comment  ?Time family notified 1830  ?Additional tests No  ?Simple treatment Other (comment) ?(rest in bed)  ?Progress note created (see row info) Yes  ?Adult Fall Risk Assessment  ?Risk Factor Category (scoring not indicated) Fall has occurred during this admission (document High fall risk)  ?Patient Fall Risk Level High fall risk  ?Adult Fall Risk Interventions  ?Required Bundle Interventions *See Row Information* High fall risk - low, moderate, and high requirements implemented  ?Additional Interventions Room near nurses station  ?Screening for Fall Injury Risk (To be completed on HIGH fall risk patients) - Assessing Need for Floor Mats  ?Risk For Fall Injury- Criteria for Floor Mats Previous fall this admission  ?Vitals  ?Temp (!) 97.4 ?F (36.3 ?C)  ?Temp Source Oral  ?BP 98/70  ?Pulse Rate 63  ?Resp 16  ?Oxygen Therapy  ?SpO2 100 %  ?O2 Device Room Air  ?Pain Assessment  ?Pain Score 0  ?Neurological  ?Neuro (WDL) WDL  ?Musculoskeletal  ?Musculoskeletal (WDL) WDL  ?Integumentary  ?Integumentary (WDL) WDL  ? ? ?

## 2021-07-25 NOTE — ED Provider Notes (Signed)
?[facility] ?Provider Note ? ? ?CSN: [id number] ?Arrival date & time: 07/25/21  1108 ? ?  ? ?History ? ?Chief Complaint  ?Patient presents with  ? Weakness  ? ? ?Stacie Greene is a 46 y.o. female. ? ?46 yo F with a chief complaints of fatigue.  This been going on for at least the past week.  She felt like her food tasted little bit funny and she just feels rundown.  She unfortunately has not been able to go to dialysis since Thanksgiving and has not been coordinated with her work schedule.  She denies any chest pain denies any difficulty breathing denies abdominal pain denies cough congestion or fever. ? ? ?Weakness ? ?  ? ?Home Medications ?Prior to Admission medications   ?Medication Sig Start Date End Date Taking? Authorizing Provider  ?aspirin EC 81 MG tablet Take 81 mg by mouth daily. Swallow whole.   Yes [provider]  ?atorvastatin (LIPITOR) 40 MG tablet Take 1 tablet (40 mg total) by mouth daily at 6 PM. Schedule an appointment with cardiology for further refills, 1st attempt ?Patient taking differently: Take 40 mg by mouth daily at 6 PM. 05/29/21  Yes Crenshaw, Denice Bors, MD  ?carvedilol (COREG) 6.25 MG tablet Take 1 tablet (6.25 mg total) by mouth 2 (two) times daily with a meal. 07/11/21  Yes Lelon Perla, MD  ?ticagrelor (BRILINTA) 90 MG TABS tablet Take 1 tablet (90 mg total) by mouth 2 (two) times daily. 04/10/20  Yes Kilroy, Lurena Joiner K, PA-C  ?nitroGLYCERIN (NITROSTAT) 0.4 MG SL tablet Place 1 tablet (0.4 mg total) under the tongue every 5 (five) minutes as needed for chest pain. ?Patient not taking: Reported on 10/16/2020 04/10/20 10/16/20  Erlene Quan, PA-C  ?   ? ?Allergies    ?Patient has no known allergies.   ? ?Review of Systems   ?Review of Systems  ?Neurological:  Positive for weakness.  ? ?Physical Exam ?Updated Vital Signs ?BP 134/71 (BP Location: Right Arm)   Pulse 85   Temp 98.1 ?F (36.7 ?C) (Oral)   Resp 17   Ht 5\' 7"  (1.702 m)   Wt 66.7  kg   SpO2 96%   BMI 23.03 kg/m?  ?Physical Exam ?Vitals and nursing note reviewed.  ?Constitutional:   ?   General: She is not in acute distress. ?   Appearance: She is well-developed. She is not diaphoretic.  ?HENT:  ?   Head: Normocephalic and atraumatic.  ?Eyes:  ?   Pupils: Pupils are equal, round, and reactive to light.  ?Cardiovascular:  ?   Rate and Rhythm: Normal rate and regular rhythm.  ?   Heart sounds: No murmur heard. ?  No friction rub. No gallop.  ?Pulmonary:  ?   Effort: Pulmonary effort is normal.  ?   Breath sounds: No wheezing or rales.  ?Abdominal:  ?   General: There is no distension.  ?   Palpations: Abdomen is soft.  ?   Tenderness: There is no abdominal tenderness.  ?Musculoskeletal:     ?   General: No tenderness.  ?   Cervical back: Normal range of motion and neck supple.  ?   Comments: Left AV fistula with palpable thrill  ?Skin: ?   General: Skin is warm and dry.  ?Neurological:  ?   Mental Status: She is alert and oriented to person, place, and time.  ?Psychiatric:     ?   Behavior: Behavior normal.  ? ? ?  ED Results / Procedures / Treatments   ?Labs ?(all labs ordered are listed, but only abnormal results are displayed) ?Labs Reviewed  ?BASIC METABOLIC PANEL - Abnormal; Notable for the following components:  ?    Result Value  ? Potassium 5.3 (*)   ? Chloride 91 (*)   ? BUN 165 (*)   ? Creatinine, Ser 42.17 (*)   ? Calcium 8.2 (*)   ? GFR, Estimated 1 (*)   ? Anion gap 28 (*)   ? All other components within normal limits  ?CBC - Abnormal; Notable for the following components:  ? WBC 12.7 (*)   ? RBC 3.55 (*)   ? Hemoglobin 9.2 (*)   ? HCT 28.8 (*)   ? MCH 25.9 (*)   ? All other components within normal limits  ?HEMOGLOBIN A1C - Abnormal; Notable for the following components:  ? Hgb A1c MFr Bld 6.2 (*)   ? All other components within normal limits  ?HEPATITIS B SURFACE ANTIBODY,QUALITATIVE - Abnormal; Notable for the following components:  ? Hep B S Ab Reactive (*)   ? All other  components within normal limits  ?BASIC METABOLIC PANEL - Abnormal; Notable for the following components:  ? Chloride 93 (*)   ? BUN 170 (*)   ? Creatinine, Ser 41.22 (*)   ? Calcium 7.6 (*)   ? GFR, Estimated 1 (*)   ? Anion gap 26 (*)   ? All other components within normal limits  ?GLUCOSE, CAPILLARY - Abnormal; Notable for the following components:  ? Glucose-Capillary 102 (*)   ? All other components within normal limits  ?CBC - Abnormal; Notable for the following components:  ? WBC 13.0 (*)   ? RBC 3.05 (*)   ? Hemoglobin 7.9 (*)   ? HCT 24.5 (*)   ? MCH 25.9 (*)   ? All other components within normal limits  ?IRON AND TIBC - Abnormal; Notable for the following components:  ? Iron 188 (*)   ? Saturation Ratios 65 (*)   ? All other components within normal limits  ?PHOSPHORUS - Abnormal; Notable for the following components:  ? Phosphorus >30.0 (*)   ? All other components within normal limits  ?HEPATIC FUNCTION PANEL - Abnormal; Notable for the following components:  ? AST 12 (*)   ? All other components within normal limits  ?BASIC METABOLIC PANEL - Abnormal; Notable for the following components:  ? Chloride 93 (*)   ? Glucose, Bld 133 (*)   ? BUN 100 (*)   ? Creatinine, Ser 29.02 (*)   ? Calcium 8.0 (*)   ? GFR, Estimated 1 (*)   ? Anion gap 19 (*)   ? All other components within normal limits  ?CBC - Abnormal; Notable for the following components:  ? WBC 11.6 (*)   ? RBC 3.08 (*)   ? Hemoglobin 8.0 (*)   ? HCT 25.0 (*)   ? All other components within normal limits  ?FOLATE - Abnormal; Notable for the following components:  ? Folate 4.3 (*)   ? All other components within normal limits  ?BASIC METABOLIC PANEL - Abnormal; Notable for the following components:  ? Chloride 95 (*)   ? Glucose, Bld 112 (*)   ? BUN 100 (*)   ? Creatinine, Ser 30.38 (*)   ? Calcium 7.4 (*)   ? GFR, Estimated 1 (*)   ? Anion gap 18 (*)   ? All other components within normal limits  ?  CBC - Abnormal; Notable for the following  components:  ? WBC 10.7 (*)   ? RBC 2.90 (*)   ? Hemoglobin 7.4 (*)   ? HCT 23.9 (*)   ? MCH 25.5 (*)   ? All other components within normal limits  ?HEPATITIS B SURFACE ANTIGEN  ?HEPATITIS B SURFACE ANTIBODY, QUANTITATIVE  ?HIV ANTIBODY (ROUTINE TESTING W REFLEX)  ?VITAMIN B12  ?URINALYSIS, ROUTINE W REFLEX MICROSCOPIC  ?CBG MONITORING, ED  ?I-STAT BETA HCG BLOOD, ED ([facility], [facility], [facility] ONLY)  ? ? ?EKG ?EKG Interpretation ? ?Date/Time:  Friday July 25 2021 11:14:31 EDT ?Ventricular Rate:  90 ?PR Interval:  138 ?QRS Duration: 72 ?QT Interval:  400 ?QTC Calculation: 489 ?R Axis:   -36 ?Text Interpretation: Normal sinus rhythm Possible Left atrial enlargement Left axis deviation Left ventricular hypertrophy ( R in aVL , Cornell product ) Prolonged QT Abnormal ECG No significant change since last tracing Confirmed by Deno Etienne [phone]) on 07/25/2021 2:41:26 PM ? ?Radiology ?No results found. ? ?Procedures ?Procedures  ? ? ?Medications Ordered in ED ?Medications  ?atorvastatin (LIPITOR) tablet 40 mg (40 mg Oral Given 07/27/21 1835)  ?nitroGLYCERIN (NITROSTAT) SL tablet 0.4 mg (has no administration in time range)  ?ticagrelor (BRILINTA) tablet 90 mg (90 mg Oral Given 07/27/21 2156)  ?heparin injection 5,000 Units (5,000 Units Subcutaneous Given 07/28/21 0612)  ?sodium chloride flush (NS) 0.9 % injection 3 mL (3 mLs Intravenous Given 07/27/21 2158)  ?sodium chloride flush (NS) 0.9 % injection 3 mL (has no administration in time range)  ?0.9 %  sodium chloride infusion (has no administration in time range)  ?acetaminophen (TYLENOL) tablet 650 mg (has no administration in time range)  ?  Or  ?acetaminophen (TYLENOL) suppository 650 mg (has no administration in time range)  ?ketorolac (TORADOL) 15 MG/ML injection 15 mg (has no administration in time range)  ?hydrALAZINE (APRESOLINE) injection 5 mg (5 mg Intravenous Given 07/25/21 1756)  ?Chlorhexidine Gluconate Cloth 2 % PADS 6 each (6 each Topical Given 07/28/21 0800)  ?ondansetron [facility])  injection 4 mg (4 mg Intravenous Given 07/27/21 0617)  ?carvedilol (COREG) tablet 25 mg (25 mg Oral Given 07/27/21 1834)  ?hydrALAZINE (APRESOLINE) tablet 25 mg (25 mg Oral Given 07/28/21 0612)  ?sevelamer carbonate (RENVELA) tab

## 2021-07-26 ENCOUNTER — Other Ambulatory Visit: Payer: Self-pay | Admitting: Cardiology

## 2021-07-26 DIAGNOSIS — Z992 Dependence on renal dialysis: Secondary | ICD-10-CM

## 2021-07-26 DIAGNOSIS — N186 End stage renal disease: Secondary | ICD-10-CM

## 2021-07-26 LAB — BASIC METABOLIC PANEL
Anion gap: 26 — ABNORMAL HIGH (ref 5–15)
BUN: 170 mg/dL — ABNORMAL HIGH (ref 6–20)
CO2: 22 mmol/L (ref 22–32)
Calcium: 7.6 mg/dL — ABNORMAL LOW (ref 8.9–10.3)
Chloride: 93 mmol/L — ABNORMAL LOW (ref 98–111)
Creatinine, Ser: 41.22 mg/dL — ABNORMAL HIGH (ref 0.44–1.00)
GFR, Estimated: 1 mL/min — ABNORMAL LOW (ref >60)
Glucose, Bld: 97 mg/dL (ref 70–99)
Potassium: 4.6 mmol/L (ref 3.5–5.1)
Sodium: 142 mmol/L (ref 135–145)

## 2021-07-26 LAB — HEPATIC FUNCTION PANEL
ALT: 14 U/L (ref 0–44)
AST: 12 U/L — ABNORMAL LOW (ref 15–41)
Albumin: 3.7 g/dL (ref 3.5–5.0)
Alkaline Phosphatase: 52 U/L (ref 38–126)
Bilirubin, Direct: <0.1 mg/dL (ref 0.0–0.2)
Total Bilirubin: 0.6 mg/dL (ref 0.3–1.2)
Total Protein: 7.3 g/dL (ref 6.5–8.1)

## 2021-07-26 LAB — PHOSPHORUS: Phosphorus: >30 mg/dL — ABNORMAL HIGH (ref 2.5–4.6)

## 2021-07-26 LAB — CBC
HCT: 24.5 % — ABNORMAL LOW (ref 36.0–46.0)
Hemoglobin: 7.9 g/dL — ABNORMAL LOW (ref 12.0–15.0)
MCH: 25.9 pg — ABNORMAL LOW (ref 26.0–34.0)
MCHC: 32.2 g/dL (ref 30.0–36.0)
MCV: 80.3 fL (ref 80.0–100.0)
Platelets: 231 10*3/uL (ref 150–400)
RBC: 3.05 MIL/uL — ABNORMAL LOW (ref 3.87–5.11)
RDW: 13.2 % (ref 11.5–15.5)
WBC: 13 10*3/uL — ABNORMAL HIGH (ref 4.0–10.5)
nRBC: 0 % (ref 0.0–0.2)

## 2021-07-26 LAB — IRON AND TIBC
Iron: 188 ug/dL — ABNORMAL HIGH (ref 28–170)
Saturation Ratios: 65 % — ABNORMAL HIGH (ref 10.4–31.8)
TIBC: 291 ug/dL (ref 250–450)
UIBC: 103 ug/dL

## 2021-07-26 LAB — HIV ANTIBODY (ROUTINE TESTING W REFLEX): HIV Screen 4th Generation wRfx: NONREACTIVE

## 2021-07-26 MED ORDER — SODIUM CHLORIDE 0.9 % IV BOLUS
500.0000 mL | Freq: Once | INTRAVENOUS | Status: AC
  Administered 2021-07-26: 500 mL via INTRAVENOUS

## 2021-07-26 MED ORDER — SODIUM CHLORIDE 0.9 % IV SOLN
6.2500 mg | INTRAVENOUS | Status: AC
  Administered 2021-07-26: 6.25 mg via INTRAVENOUS
  Filled 2021-07-26: qty 0.25

## 2021-07-26 MED ORDER — CARVEDILOL 25 MG PO TABS
25.0000 mg | ORAL_TABLET | Freq: Two times a day (BID) | ORAL | Status: DC
  Administered 2021-07-26 – 2021-07-29 (×5): 25 mg via ORAL
  Filled 2021-07-26 (×5): qty 1

## 2021-07-26 MED ORDER — HYDRALAZINE HCL 25 MG PO TABS
25.0000 mg | ORAL_TABLET | Freq: Three times a day (TID) | ORAL | Status: DC
  Administered 2021-07-26 – 2021-07-29 (×8): 25 mg via ORAL
  Filled 2021-07-26 (×8): qty 1

## 2021-07-26 MED ORDER — SEVELAMER CARBONATE 800 MG PO TABS
1600.0000 mg | ORAL_TABLET | Freq: Three times a day (TID) | ORAL | Status: DC
  Administered 2021-07-26 – 2021-07-28 (×5): 1600 mg via ORAL
  Filled 2021-07-26 (×8): qty 2

## 2021-07-26 MED ORDER — HYDRALAZINE HCL 25 MG PO TABS: 25.0000 mg | ORAL_TABLET | Freq: Three times a day (TID) | ORAL | Status: DC

## 2021-07-26 NOTE — Progress Notes (Signed)
?PROGRESS NOTE ? ? ? ?Stacie Greene  JTT:[id number] DOB: 1976-03-31 DOA: 07/25/2021 ?PCP: Pcp, No  ? ?  ?Brief Narrative:  ?46 year old female with hypertension and chronic renal failure.  She was on hemodialysis through 5 months ago and due to scheduling issues was not able to set up peritoneal dialysis. 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 She continues on Brilinta.  Unfortunately, she does not currently have a primary care physician or nephrologist. ? ? ?New events last 24 hours / Subjective: ?Patient reports being tired after having dialysis this morning, her first 1.  The nephrology team wants her to have another round either tomorrow or in 2 days.  She is not having any nausea, vomiting, chest pain, or shortness of breath currently. ? ?Assessment & Plan: ?  ?Principal Problem: ?  ESRD (end stage renal disease) on dialysis  ?(HCC) ? Appreciate nephrology-they hope to repeat HD tomorrow or in 2 days ? Monitor renal panel ?  ?Active Problems: ?  Ischemic cardiomyopathy ? ?  Essential hypertension ? Hydralazine 25 mg 3 times daily with injection as needed ? Coreg 25 mg twice daily ? Could add amlodipine if still not controlled ? ?  HLD (hyperlipidemia)/CAD ? Nitro as needed ? Brilinta 90 mg daily ? Lipitor 40 mg daily ? ? ?DVT prophylaxis: Heparin ?Code Status: Full ?Family Communication: Self ?Coming From: Home ?Disposition Plan: Home ?Barriers to Discharge: Clinical improvement ? ?Consultants:  ?Nephrology ? ?Procedures:  ?07/26/21- HD ? ?Objective: ?Vitals:  ? 07/26/21 0900 07/26/21 0955 07/26/21 1000 07/26/21 1017  ?BP: (!) 189/116 108/65 (!) 183/106 (!) 174/106  ?Pulse: 84 (!) 55 62 94  ?Resp: 12 16 14 19   ?Temp:  98.1 ?F (36.7 ?C)  98 ?F (36.7 ?C)  ?TempSrc:      ?SpO2:    100%  ?Weight:  66.7 kg    ? ? ?Intake/Output Summary (Last 24 hours) at 07/26/2021 1324 ?Last data filed at 07/26/2021 0955 ?Gross per 24 hour  ?Intake 83.25 ml  ?Output 98 ml  ?Net -14.75 ml  ? ?Filed Weights  ? 07/25/21  1714 07/26/21 0700 07/26/21 0955  ?Weight: 71.6 kg 66.1 kg 66.7 kg  ? ? ?Examination:  ?General exam: Appears calm and comfortable  ?Respiratory system: Clear to auscultation. Respiratory effort normal. No respiratory distress. No conversational dyspnea.  ?Cardiovascular system: S1 & S2 heard, RRR. No murmurs. No pedal edema. ?Gastrointestinal system: Abdomen is nondistended, soft and nontender. Normal bowel sounds heard. ?Central nervous system: Alert and oriented.  ?Extremities: Symmetric in appearance  ?Skin: No rashes, lesions or ulcers on exposed skin  ?Psychiatry: Mood & affect appropriate.  ? ?Data Reviewed: I have personally reviewed following labs and imaging studies ? ?CBC: ?Recent Labs  ?Lab 07/25/21 ?1126 07/26/21 ?0626  ?WBC 12.7* 13.0*  ?HGB 9.2* 7.9*  ?HCT 28.8* 24.5*  ?MCV 81.1 80.3  ?PLT 262 231  ? ?Basic Metabolic Panel: ?Recent Labs  ?Lab 07/25/21 ?1126 07/26/21 ?0725 07/26/21 ?1040  ?NA 141 142  --   ?K 5.3* 4.6  --   ?CL 91* 93*  --   ?CO2 22 22  --   ?GLUCOSE 98 97  --   ?BUN 165* 170*  --   ?CREATININE 42.17* 41.22*  --   ?CALCIUM 8.2* 7.6*  --   ?PHOS  --   --  >30.0*  ? ?Liver Function Tests: ?Recent Labs  ?Lab 07/26/21 ?1040  ?AST 12*  ?ALT 14  ?ALKPHOS 52  ?  BILITOT 0.6  ?PROT 7.3  ?ALBUMIN 3.7  ? ?Recent Labs  ?  07/25/21 ?1656  ?HGBA1C 6.2*  ? ?CBG: ?Recent Labs  ?Lab 07/25/21 ?1833  ?GLUCAP 102*  ? ?Anemia Panel: ?Recent Labs  ?  07/26/21 ?0740  ?TIBC 291  ?IRON 188*  ? ?Radiology Studies: ?ECHOCARDIOGRAM COMPLETE ? ?Result Date: 07/25/2021 ?   ECHOCARDIOGRAM REPORT   Patient Name:   Stacie Greene Date of Exam: 07/25/2021 Medical Rec #:  [id number]     Height:       67.0 in Accession #:    [id number]    Weight:       150.7 lb Date of Birth:  [birth year]     BSA:          1.793 m? Patient Age:    32 years      BP:           199/114 mmHg Patient Gender: F             HR:           75 bpm. Exam Location:  Inpatient Procedure: 2D Echo Indications:    acute diastolic chf  History:        Patient has  prior history of Echocardiogram examinations, most                 recent 03/28/2020. Cardiomyopathy, end stage renal disease; Risk                 Factors:Hypertension.  Sonographer:    Johny Chess RDCS Referring Phys: [place]  1. Left ventricular ejection fraction, by estimation, is 60 to 65%. The left ventricle has normal function. The left ventricle has no regional wall motion abnormalities. There is moderate left ventricular hypertrophy. Left ventricular diastolic parameters are consistent with Grade I diastolic dysfunction (impaired relaxation).  2. Right ventricular systolic function is normal. The right ventricular size is normal.  3. The mitral valve is normal in structure. Trivial mitral valve regurgitation. No evidence of mitral stenosis.  4. The aortic valve is normal in structure. Aortic valve regurgitation is not visualized. No aortic stenosis is present.  5. The inferior vena cava is normal in size with greater than 50% respiratory variability, suggesting right atrial pressure of 3 mmHg. FINDINGS  Left Ventricle: Left ventricular ejection fraction, by estimation, is 60 to 65%. The left ventricle has normal function. The left ventricle has no regional wall motion abnormalities. The left ventricular internal cavity size was normal in size. There is  moderate left ventricular hypertrophy. Left ventricular diastolic parameters are consistent with Grade I diastolic dysfunction (impaired relaxation). Right Ventricle: The right ventricular size is normal. No increase in right ventricular wall thickness. Right ventricular systolic function is normal. Left Atrium: Left atrial size was normal in size. Right Atrium: Right atrial size was normal in size. Pericardium: There is no evidence of pericardial effusion. Mitral Valve: The mitral valve is normal in structure. Trivial mitral valve regurgitation. No evidence of mitral valve stenosis. Tricuspid Valve: The tricuspid valve is  normal in structure. Tricuspid valve regurgitation is trivial. No evidence of tricuspid stenosis. Aortic Valve: The aortic valve is normal in structure. Aortic valve regurgitation is not visualized. No aortic stenosis is present. Pulmonic Valve: The pulmonic valve was normal in structure. Pulmonic valve regurgitation is trivial. No evidence of pulmonic stenosis. Aorta: The aortic root is normal in size and structure. Venous: The inferior vena cava is normal in size with greater  than 50% respiratory variability, suggesting right atrial pressure of 3 mmHg. IAS/Shunts: No atrial level shunt detected by color flow Doppler.  LEFT VENTRICLE PLAX 2D LVIDd:         4.30 cm     Diastology LVIDs:         2.80 cm     LV e' medial:    3.69 cm/s LV PW:         1.30 cm     LV E/e' medial:  11.8 LV IVS:        1.20 cm     LV e' lateral:   3.19 cm/s LVOT diam:     1.90 cm     LV E/e' lateral: 13.7 LV SV:         43 LV SV Index:   24 LVOT Area:     2.84 cm?  LV Volumes (MOD) LV vol d, MOD A4C: 73.8 ml LV vol s, MOD A4C: 37.0 ml LV SV MOD A4C:     73.8 ml RIGHT VENTRICLE RV S prime:     12.60 cm/s TAPSE (M-mode): 1.6 cm LEFT ATRIUM             Index        RIGHT ATRIUM          Index LA diam:        3.20 cm 1.78 cm/m?   RA Area:     9.24 cm? LA Vol (A2C):   42.1 ml 23.48 ml/m?  RA Volume:   16.30 ml 9.09 ml/m? LA Vol (A4C):   40.7 ml 22.70 ml/m? LA Biplane Vol: 45.0 ml 25.10 ml/m?  AORTIC VALVE LVOT Vmax:   92.20 cm/s LVOT Vmean:  59.200 cm/s LVOT VTI:    0.151 m  AORTA Ao Asc diam: 2.70 cm MV E velocity: 43.70 cm/s MV A velocity: 66.90 cm/s  SHUNTS MV E/A ratio:  0.65        Systemic VTI:  0.15 m                            Systemic Diam: 1.90 cm Glori Bickers MD Electronically signed by Glori Bickers MD Signature Date/Time: 07/25/2021/7:20:45 PM    Final    ? ? ?Scheduled Meds: ? atorvastatin  40 mg Oral q1800  ? carvedilol  25 mg Oral BID WC  ? Chlorhexidine Gluconate Cloth  6 each Topical Q0600  ? heparin  5,000 Units  Subcutaneous Q8H  ? hydrALAZINE  25 mg Oral Q8H  ? sodium chloride flush  3 mL Intravenous Q12H  ? ticagrelor  90 mg Oral BID  ? ?Continuous Infusions: ? sodium chloride    ? ? ? LOS: 1 day  ? ? ?Time spent: 20

## 2021-07-26 NOTE — Progress Notes (Signed)
removed 159mls net fluid. nausea and dry heaving 20 minutes into treatment, obtained order and gave phenergan. pt received zofran prior to coming,  37 minutes left in treatment , pt lost consiousness, bp dropped from 530 systolic to 051 systolic put in trendelenburg , sternal rub discontinued tx with fluid wide open.  pt roused, and bp came back up to 102 systolic .  HR also dropped from low 100s to 50s at this time,  hr in the 60s upon release from dialysis.  Nephrology aware.  tx terminated at time of event.  2 bandages to left forearm avf no bleeding post tx.  pre bp 186/105 post bp 183/106. ?

## 2021-07-26 NOTE — Progress Notes (Signed)
[facility] ?Progress Note ? ?Subjective: seen on HD, had near syncopal episode w/ BP drop to the low 100s. Given saline then rinsed back and taken off a bit early (77min). Pt alert post HD.  ? ?Vitals:  ? 07/25/21 1845 07/25/21 1900 07/25/21 2036 07/26/21 0443  ?BP: 98/70 (!) 155/94 (!) 150/79 (!) 165/87  ?Pulse: 63 79 78 86  ?Resp: 16  16 18   ?Temp: (!) 97.4 ?F (36.3 ?C)  (!) 97.5 ?F (36.4 ?C) 98.1 ?F (36.7 ?C)  ?TempSrc: Oral  Oral Oral  ?SpO2: 100%  100% 99%  ?Weight:      ? ? ?Exam: ?Gen alert, no distress ?No jvd or bruits ?Chest clear bilat to bases ?RRR no MRG ?Abd soft ntnd no mass or ascites +bs ?Ext no LE edema ?Neuro is alert, Ox 3 , nf, looks tired ?   LFA AVF+bruit ?  ?  ?  ?  ? Home meds include - lipitor, coreg 6.25 bid, brilinta 90 bid, sl ntg prn, asa ?  ?  ?  ? OP HD: last HD recorded was June 2022 at [facility] ?  ?  ?Assessment/ Plan: ?Uremia - w/ ESRD was on HD in 2022 but then stopped going. Not sure exactly when she stopped, HD records suggest stopped going in June 2022. Now is here quite uremic w/ severe fatigue, uremic fetor, N/V. No asterixis. BUN 200, creat 45 and would like to resume dialysis. Wants to do PD ultimately - I told her she would have to prove good compliance on existing modality (in-center HD) to be considered for any home dialysis. Not sure she understands this. Had 1st HD today, w/ next HD Monday (possibly Sunday depending on staffing) and gradually get numbers down.  ?BP/ volume - BP's remain high, not due to vol overload as has been vomiting and didn't tolerate 1 L UF on HD today.  Keep even w/ next session and will give 500 cc NS bolus. Will start hydralazine 25 tid and ^coreg 25 bid for BP control.  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  ?  ? ? ? ?Rob Doctor, hospital ?07/26/2021, 5:46 AM ? ? ?Recent Labs  ?Lab 07/25/21 ?1126  ?HGB 9.2*  ?CALCIUM 8.2*  ?CREATININE 42.17*  ?K 5.3*  ? ?Inpatient medications: ? atorvastatin  40 mg Oral q1800  ? carvedilol  6.25  mg Oral BID WC  ? Chlorhexidine Gluconate Cloth  6 each Topical Q0600  ? heparin  5,000 Units Subcutaneous Q8H  ? sodium chloride flush  3 mL Intravenous Q12H  ? ticagrelor  90 mg Oral BID  ? ? sodium chloride    ? ?sodium chloride, acetaminophen **OR** acetaminophen, heparin, hydrALAZINE, ketorolac, nitroGLYCERIN, ondansetron (ZOFRAN) IV, sodium chloride flush ? ? ? ? ? ? ?

## 2021-07-27 ENCOUNTER — Encounter (HOSPITAL_COMMUNITY): Payer: Self-pay | Admitting: Internal Medicine

## 2021-07-27 DIAGNOSIS — R112 Nausea with vomiting, unspecified: Secondary | ICD-10-CM | POA: Diagnosis not present

## 2021-07-27 LAB — BASIC METABOLIC PANEL
Anion gap: 19 — ABNORMAL HIGH (ref 5–15)
BUN: 100 mg/dL — ABNORMAL HIGH (ref 6–20)
CO2: 24 mmol/L (ref 22–32)
Calcium: 8 mg/dL — ABNORMAL LOW (ref 8.9–10.3)
Chloride: 93 mmol/L — ABNORMAL LOW (ref 98–111)
Creatinine, Ser: 29.02 mg/dL — ABNORMAL HIGH (ref 0.44–1.00)
GFR, Estimated: 1 mL/min — ABNORMAL LOW (ref >60)
Glucose, Bld: 133 mg/dL — ABNORMAL HIGH (ref 70–99)
Potassium: 4.2 mmol/L (ref 3.5–5.1)
Sodium: 136 mmol/L (ref 135–145)

## 2021-07-27 LAB — CBC
HCT: 25 % — ABNORMAL LOW (ref 36.0–46.0)
Hemoglobin: 8 g/dL — ABNORMAL LOW (ref 12.0–15.0)
MCH: 26 pg (ref 26.0–34.0)
MCHC: 32 g/dL (ref 30.0–36.0)
MCV: 81.2 fL (ref 80.0–100.0)
Platelets: 213 K/uL (ref 150–400)
RBC: 3.08 MIL/uL — ABNORMAL LOW (ref 3.87–5.11)
RDW: 13.5 % (ref 11.5–15.5)
WBC: 11.6 K/uL — ABNORMAL HIGH (ref 4.0–10.5)
nRBC: 0 % (ref 0.0–0.2)

## 2021-07-27 LAB — HEPATITIS B SURFACE ANTIBODY, QUANTITATIVE: Hep B S AB Quant (Post): 103.1 m[IU]/mL (ref >9.9)

## 2021-07-27 LAB — VITAMIN B12: Vitamin B-12: 425 pg/mL (ref 180–914)

## 2021-07-27 LAB — FOLATE: Folate: 4.3 ng/mL — ABNORMAL LOW (ref >5.9)

## 2021-07-27 MED ORDER — SODIUM CHLORIDE 0.9 % IV SOLN
6.2500 mg | Freq: Four times a day (QID) | INTRAVENOUS | Status: DC | PRN
  Administered 2021-07-27 – 2021-07-29 (×4): 6.25 mg via INTRAVENOUS
  Filled 2021-07-27 (×4): qty 0.25

## 2021-07-27 NOTE — Progress Notes (Signed)
?PROGRESS NOTE ? ? ? ?Stacie Greene  NMM:[id number] DOB: [birth year] DOA: 07/25/2021 ?PCP: Pcp, No  ? ?  ?Brief Narrative:  ?46 year old female with hypertension and chronic renal failure.  She was on hemodialysis through 5 months ago and due to scheduling issues was not able to set up peritoneal dialysis. 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 She continues on Brilinta.  Unfortunately, she does not currently have a primary care physician or nephrologist. ? ? ?New events last 24 hours / Subjective: ?Had dialysis yesterday, felt a little bit better.  Still having nausea/vomiting.  The medication helps for a short period of time until it wears off.  Diet has been poor. ? ?Assessment & Plan: ?  ?Principal Problem: ?  ESRD (end stage renal disease) on dialysis  ?(HCC) ?            Appreciate nephrology-HD planned for today ?            Monitor renal panel ? Cr down to 29.03 from 41.22 ?             ?Active Problems: ?  Ischemic cardiomyopathy ?  ?  Essential hypertension ?            Hydralazine 25 mg 3 times daily with injection as needed ?            Coreg 25 mg twice daily ?  ?  HLD (hyperlipidemia)/CAD ?            Nitro as needed ?            Brilinta 90 mg daily ?            Lipitor 40 mg daily ? ?  Nausea with vomiting ? Zofran 4 mg every 6 hours prn ? Add Phenergan 6.25 mg every 6 hours as needed ?   ?DVT prophylaxis: Heparin ?Code Status: Full ?Family Communication: Self, mom bedside ?Coming From: Home ?Disposition Plan: Home ?Barriers to Discharge: Clinical improvement ?  ?Consultants:  ?Nephrology ?  ? ?Procedures:  ?07/26/21- HD ? ?Objective: ?Vitals:  ? 07/26/21 1631 07/26/21 2156 07/27/21 3159 07/27/21 0855  ?BP: (!) 172/97 140/85 (!) 145/71 125/75  ?Pulse: 100 93 97 96  ?Resp: 17 18 18 16   ?Temp: 99.1 ?F (37.3 ?C) 98.4 ?F (36.9 ?C) 98 ?F (36.7 ?C) 98.9 ?F (37.2 ?C)  ?TempSrc: Oral Oral Oral Oral  ?SpO2: 100% 99% 99% 96%  ?Weight:      ? ? ?Intake/Output Summary (Last 24 hours) at 07/27/2021  1301 ?Last data filed at 07/27/2021 1009 ?Gross per 24 hour  ?Intake 483 ml  ?Output 0 ml  ?Net 483 ml  ? ?Filed Weights  ? 07/25/21 1714 07/26/21 0700 07/26/21 0955  ?Weight: 71.6 kg 66.1 kg 66.7 kg  ? ? ?Examination:  ?General exam: Appears calm and comfortable  ?Respiratory system: Clear to auscultation. Respiratory effort normal. No respiratory distress. No conversational dyspnea.  ?Cardiovascular system: S1 & S2 heard, RRR. No murmurs. No pedal edema. ?Gastrointestinal system: Abdomen is nondistended, soft and mildly ttp diffusely. Normal bowel sounds heard. ?Central nervous system: Alert and oriented. No focal neurological deficits. Speech clear.  ?Extremities: Symmetric in appearance  ?Skin: No rashes, lesions or ulcers on exposed skin  ?Psychiatry: Judgement and insight appear normal. Mood & affect appropriate.  ? ?Data Reviewed: I have personally reviewed following labs and imaging studies ? ?CBC: ?Recent Labs  ?Lab 07/25/21 ?1126 07/26/21 ?0626 07/27/21 ?0831  ?WBC  12.7* 13.0* 11.6*  ?HGB 9.2* 7.9* 8.0*  ?HCT 28.8* 24.5* 25.0*  ?MCV 81.1 80.3 81.2  ?PLT 262 231 213  ? ?Basic Metabolic Panel: ?Recent Labs  ?Lab 07/25/21 ?1126 07/26/21 ?0725 07/26/21 ?1040 07/27/21 ?0831  ?NA 141 142  --  136  ?K 5.3* 4.6  --  4.2  ?CL 91* 93*  --  93*  ?CO2 22 22  --  24  ?GLUCOSE 98 97  --  133*  ?BUN 165* 170*  --  100*  ?CREATININE 42.17* 41.22*  --  29.02*  ?CALCIUM 8.2* 7.6*  --  8.0*  ?PHOS  --   --  >30.0*  --   ? ?GFR: ?CrCl cannot be calculated (Unknown ideal weight.). ?Liver Function Tests: ?Recent Labs  ?Lab 07/26/21 ?1040  ?AST 12*  ?ALT 14  ?ALKPHOS 52  ?BILITOT 0.6  ?PROT 7.3  ?ALBUMIN 3.7  ? ?No results for input(s): LIPASE, AMYLASE in the last 168 hours. ?No results for input(s): AMMONIA in the last 168 hours. ?Coagulation Profile: ?No results for input(s): INR, PROTIME in the last 168 hours. ?Cardiac Enzymes: ?No results for input(s): CKTOTAL, CKMB, CKMBINDEX, TROPONINI in the last 168 hours. ?BNP (last 3  results) ?No results for input(s): PROBNP in the last 8760 hours. ?HbA1C: ?Recent Labs  ?  07/25/21 ?1656  ?HGBA1C 6.2*  ? ?CBG: ?Recent Labs  ?Lab 07/25/21 ?1833  ?GLUCAP 102*  ? ?Lipid Profile: ?No results for input(s): CHOL, HDL, LDLCALC, TRIG, CHOLHDL, LDLDIRECT in the last 72 hours. ?Thyroid Function Tests: ?No results for input(s): TSH, T4TOTAL, FREET4, T3FREE, THYROIDAB in the last 72 hours. ?Anemia Panel: ?Recent Labs  ?  07/26/21 ?0740 07/27/21 ?0831  ?VITAMINB12  --  425  ?FOLATE  --  4.3*  ?TIBC 291  --   ?IRON 188*  --   ? ?Sepsis Labs: ?No results for input(s): PROCALCITON, LATICACIDVEN in the last 168 hours. ? ?No results found for this or any previous visit (from the past 240 hour(s)).  ? ? ?Radiology Studies: ?ECHOCARDIOGRAM COMPLETE ? ?Result Date: 07/25/2021 ?   ECHOCARDIOGRAM REPORT   Patient Name:   Stacie Greene Date of Exam: 07/25/2021 Medical Rec #:  [id number]     Height:       67.0 in Accession #:    [id number]    Weight:       150.7 lb Date of Birth:  1975-06-02     BSA:          1.793 m? Patient Age:    69 years      BP:           199/114 mmHg Patient Gender: F             HR:           75 bpm. Exam Location:  Inpatient Procedure: 2D Echo Indications:    acute diastolic chf  History:        Patient has prior history of Echocardiogram examinations, most                 recent 03/28/2020. Cardiomyopathy, end stage renal disease; Risk                 Factors:Hypertension.  Sonographer:    Johny Chess RDCS Referring Phys: [place]  1. Left ventricular ejection fraction, by estimation, is 60 to 65%. The left ventricle has normal function. The left ventricle has no regional wall motion abnormalities. There is moderate left ventricular  hypertrophy. Left ventricular diastolic parameters are consistent with Grade I diastolic dysfunction (impaired relaxation).  2. Right ventricular systolic function is normal. The right ventricular size is normal.  3. The mitral valve is  normal in structure. Trivial mitral valve regurgitation. No evidence of mitral stenosis.  4. The aortic valve is normal in structure. Aortic valve regurgitation is not visualized. No aortic stenosis is present.  5. The inferior vena cava is normal in size with greater than 50% respiratory variability, suggesting right atrial pressure of 3 mmHg. FINDINGS  Left Ventricle: Left ventricular ejection fraction, by estimation, is 60 to 65%. The left ventricle has normal function. The left ventricle has no regional wall motion abnormalities. The left ventricular internal cavity size was normal in size. There is  moderate left ventricular hypertrophy. Left ventricular diastolic parameters are consistent with Grade I diastolic dysfunction (impaired relaxation). Right Ventricle: The right ventricular size is normal. No increase in right ventricular wall thickness. Right ventricular systolic function is normal. Left Atrium: Left atrial size was normal in size. Right Atrium: Right atrial size was normal in size. Pericardium: There is no evidence of pericardial effusion. Mitral Valve: The mitral valve is normal in structure. Trivial mitral valve regurgitation. No evidence of mitral valve stenosis. Tricuspid Valve: The tricuspid valve is normal in structure. Tricuspid valve regurgitation is trivial. No evidence of tricuspid stenosis. Aortic Valve: The aortic valve is normal in structure. Aortic valve regurgitation is not visualized. No aortic stenosis is present. Pulmonic Valve: The pulmonic valve was normal in structure. Pulmonic valve regurgitation is trivial. No evidence of pulmonic stenosis. Aorta: The aortic root is normal in size and structure. Venous: The inferior vena cava is normal in size with greater than 50% respiratory variability, suggesting right atrial pressure of 3 mmHg. IAS/Shunts: No atrial level shunt detected by color flow Doppler.  LEFT VENTRICLE PLAX 2D LVIDd:         4.30 cm     Diastology LVIDs:          2.80 cm     LV e' medial:    3.69 cm/s LV PW:         1.30 cm     LV E/e' medial:  11.8 LV IVS:        1.20 cm     LV e' lateral:   3.19 cm/s LVOT diam:     1.90 cm     LV E/e' lateral: 13.7 LV SV:         43

## 2021-07-27 NOTE — Progress Notes (Signed)
[facility] ?Progress Note ? ?Subjective: seen in room. Had low BP episode in HD yest, required saline bolus. Feeling about the same today, still nauseous.  ? ?Vitals:  ? 07/26/21 1017 07/26/21 1631 07/26/21 2156 07/27/21 0605  ?BP: (!) 174/106 (!) 172/97 140/85 (!) 145/71  ?Pulse: 94 100 93 97  ?Resp: 19 17 18 18   ?Temp: 98 ?F (36.7 ?C) 99.1 ?F (37.3 ?C) 98.4 ?F (36.9 ?C) 98 ?F (36.7 ?C)  ?TempSrc:  Oral Oral Oral  ?SpO2: 100% 100% 99% 99%  ?Weight:      ? ? ?Exam: ?Gen alert, no distress ?No jvd or bruits ?Chest clear bilat to bases ?RRR no MRG ?Abd soft ntnd no mass or ascites +bs ?Ext no LE edema ?Neuro is alert, Ox 3 , nf, looks tired ?   LFA AVF+bruit ?  ? Home meds include - lipitor, coreg 6.25 bid, brilinta 90 bid, sl ntg prn, asa ?  ? OP HD: last HD recorded was June 2022 at [facility] ? - 4/07 hep B Ag neg, and hep B Abs were high/ protective ?  ?  ?Assessment/ Plan: ?Uremia - w/ ESRD was on HD in 2022 but then stopped going, not sure exactly when she stopped, HD records suggest stopped going in June 2022. Now is here quite uremic w/ severe fatigue, uremic fetor, N/V and BUN 200, creat 45. Says she would like to resume dialysis. Wants to do PD ultimately - told her she would have to prove good compliance on existing modality (in-center HD) to be considered for home dialysis. Not sure she understands this. 1st HD yesterday, next HD Monday (or possibly Sunday night if staffing allows). Cont to titrate BFR and times up.   ?HTN/ volume - Started hydralazine 25 tid and ^coreg 25 bid for BP control. Don't think there is any excess volume, didn't tolerate low UF goal on HD here Sat. Keep even next HD.  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 Tsat 65% on 4/08. Consider esa.  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 Dialysis will help.   ?  ? ? ? ?Rob Doctor, hospital ?07/27/2021, 6:37 AM ? ? ?Recent Labs  ?Lab 07/25/21 ?1126 07/26/21 ?0626 07/26/21 ?0725 07/26/21 ?1040  ?HGB 9.2* 7.9*  --   --   ?ALBUMIN  --   --    --  3.7  ?CALCIUM 8.2*  --  7.6*  --   ?PHOS  --   --   --  >30.0*  ?CREATININE 42.17*  --  41.22*  --   ?K 5.3*  --  4.6  --   ? ? ?Inpatient medications: ? atorvastatin  40 mg Oral q1800  ? carvedilol  25 mg Oral BID WC  ? Chlorhexidine Gluconate Cloth  6 each Topical Q0600  ? heparin  5,000 Units Subcutaneous Q8H  ? hydrALAZINE  25 mg Oral Q8H  ? sevelamer carbonate  1,600 mg Oral TID WC  ? sodium chloride flush  3 mL Intravenous Q12H  ? ticagrelor  90 mg Oral BID  ? ? sodium chloride    ? ?sodium chloride, acetaminophen **OR** acetaminophen, hydrALAZINE, ketorolac, nitroGLYCERIN, ondansetron (ZOFRAN) IV, sodium chloride flush ? ? ? ? ? ? ?

## 2021-07-28 DIAGNOSIS — I5032 Chronic diastolic (congestive) heart failure: Secondary | ICD-10-CM

## 2021-07-28 DIAGNOSIS — I2510 Atherosclerotic heart disease of native coronary artery without angina pectoris: Secondary | ICD-10-CM

## 2021-07-28 DIAGNOSIS — N185 Chronic kidney disease, stage 5: Secondary | ICD-10-CM

## 2021-07-28 DIAGNOSIS — I5042 Chronic combined systolic (congestive) and diastolic (congestive) heart failure: Secondary | ICD-10-CM

## 2021-07-28 DIAGNOSIS — D631 Anemia in chronic kidney disease: Secondary | ICD-10-CM

## 2021-07-28 LAB — BASIC METABOLIC PANEL
Anion gap: 18 — ABNORMAL HIGH (ref 5–15)
BUN: 100 mg/dL — ABNORMAL HIGH (ref 6–20)
CO2: 22 mmol/L (ref 22–32)
Calcium: 7.4 mg/dL — ABNORMAL LOW (ref 8.9–10.3)
Chloride: 95 mmol/L — ABNORMAL LOW (ref 98–111)
Creatinine, Ser: 30.38 mg/dL — ABNORMAL HIGH (ref 0.44–1.00)
GFR, Estimated: 1 mL/min — ABNORMAL LOW (ref >60)
Glucose, Bld: 112 mg/dL — ABNORMAL HIGH (ref 70–99)
Potassium: 3.9 mmol/L (ref 3.5–5.1)
Sodium: 135 mmol/L (ref 135–145)

## 2021-07-28 LAB — CBC
HCT: 23.9 % — ABNORMAL LOW (ref 36.0–46.0)
Hemoglobin: 7.4 g/dL — ABNORMAL LOW (ref 12.0–15.0)
MCH: 25.5 pg — ABNORMAL LOW (ref 26.0–34.0)
MCHC: 31 g/dL (ref 30.0–36.0)
MCV: 82.4 fL (ref 80.0–100.0)
Platelets: 208 10*3/uL (ref 150–400)
RBC: 2.9 MIL/uL — ABNORMAL LOW (ref 3.87–5.11)
RDW: 13.3 % (ref 11.5–15.5)
WBC: 10.7 10*3/uL — ABNORMAL HIGH (ref 4.0–10.5)
nRBC: 0 % (ref 0.0–0.2)

## 2021-07-28 MED ORDER — HEPARIN SODIUM (PORCINE) 1000 UNIT/ML DIALYSIS: 2500.0000 [IU] | INTRAMUSCULAR | Status: DC | PRN

## 2021-07-28 MED ORDER — DARBEPOETIN ALFA 40 MCG/0.4ML IJ SOSY
40.0000 ug | PREFILLED_SYRINGE | INTRAMUSCULAR | Status: DC
  Administered 2021-07-29: 40 ug via INTRAVENOUS
  Filled 2021-07-28: qty 0.4

## 2021-07-28 NOTE — Assessment & Plan Note (Signed)
-   Continue atorvastatin, carvedilol, hydralazine ?- Resume aspirin at discharge ?

## 2021-07-28 NOTE — Assessment & Plan Note (Signed)
EF 40% in 2021 with MI, repeated here, EF now back to normal, grade I DD, normal valves. ?-Continue Coreg ?

## 2021-07-28 NOTE — Progress Notes (Signed)
Requested to see pt for out-pt HD needs. Met with pt at bedside to introduce self and explain role. Pt reports receiving HD at [facility] in the past and prefers to resume care at this clinic if possible. Referral made to [facility] admissions today. Pt currently works F/T and plans to drive self to out-pt HD appts. Will assist as needed.  ? ?Stacie Greene ?Renal Navigator ?[phone] ?

## 2021-07-28 NOTE — Assessment & Plan Note (Signed)
Hemoglobin slightly down  ?- Aranesp per nephrology ?

## 2021-07-28 NOTE — TOC Initial Note (Signed)
Transition of Care (TOC) - Initial/Assessment Note  ? ? ?Patient Details  ?Name: Stacie Greene ?MRN: [id number] ?Date of Birth: 22-Jul-1975 ? ?Transition of Care (TOC) CM/SW Contact:    ?Tom-Johnson, Renea Ee, RN ?Phone Number: ?07/28/2021, 1:33 PM ? ?Clinical Narrative:                 ? ?CM spoke with patient at bedside about need for post hospital transition. Admitted for ESRD. From home alone. Mother, two children and two siblings are supportive with care. Employed at [employer] and independent with care prior to admission.  ?Does not have a PCP. CM gave patient a list of PCP's and she requested to schedule with [place]. CM called and able to get an appointment at [facility] [place] on [address].  ?MD notified for PT/OT order to eval for disposition.  ?Patient was on dialysis but stopped going in June 2022. Restarted dialysis in hospital and will be getting her second dialysis treatment today. Awaiting to be clipped. ?CM will continue to follow with needs. ? ? ?  ?Barriers to Discharge: Continued Medical Work up ? ? ?Patient Goals and CMS Choice ?Patient states their goals for this hospitalization and ongoing recovery are:: To return home ?CMS Medicare.gov Compare Post Acute Care list provided to:: Patient ?  ? ?Expected Discharge Plan and Services ?  ?  ?Discharge Planning Services: CM Consult ?  ?Living arrangements for the past 2 months: Apartment ?                ?  ?  ?  ?  ?  ?  ?  ?  ?  ?  ? ?Prior Living Arrangements/Services ?Living arrangements for the past 2 months: Apartment ?Lives with:: Self ?Patient language and need for interpreter reviewed:: Yes ?Do you feel safe going back to the place where you live?: Yes      ?Need for Family Participation in Patient Care: Yes (Comment) ?Care giver support system in place?: Yes (comment) ?  ?Criminal Activity/Legal Involvement Pertinent to Current Situation/Hospitalization: No - Comment as needed ? ?Activities of Daily Living ?Home Assistive  Devices/Equipment: None ?ADL Screening (condition at time of admission) ?Patient's cognitive ability adequate to safely complete daily activities?: Yes ?Is the patient deaf or have difficulty hearing?: No ?Does the patient have difficulty seeing, even when wearing glasses/contacts?: No ?Does the patient have difficulty concentrating, remembering, or making decisions?: No ?Patient able to express need for assistance with ADLs?: Yes ?Does the patient have difficulty dressing or bathing?: No ?Independently performs ADLs?: Yes (appropriate for developmental age) ?Does the patient have difficulty walking or climbing stairs?: No ?Weakness of Legs: None ?Weakness of Arms/Hands: None ? ?Permission Sought/Granted ?Permission sought to share information with : Case Manager, Family Supports ?Permission granted to share information with : Yes, Verbal Permission Granted ?   ?   ?   ?   ? ?Emotional Assessment ?Appearance:: Appears stated age ?Attitude/Demeanor/Rapport: Engaged, Gracious ?Affect (typically observed): Accepting, Appropriate, Calm, Hopeful ?Orientation: : Oriented to Self, Oriented to Place, Oriented to  Time, Oriented to Situation ?Alcohol / Substance Use: Not Applicable ?Psych Involvement: No (comment) ? ?Admission diagnosis:  ESRD (end stage renal disease) on dialysis ([place]) [N18.6, Z99.2] ?Patient Active Problem List  ? Diagnosis Date Noted  ? Chronic combined systolic and diastolic CHF (congestive heart failure) ([place]) 07/28/2021  ? ESRD (end stage renal disease) on dialysis ([place]) 07/25/2021  ? HLD (hyperlipidemia) 07/25/2021  ? Coronary artery disease involving native coronary artery  of native heart without angina pectoris 04/10/2020  ? Essential hypertension 04/10/2020  ? Pericarditis 03/27/2020  ? ?PCP:  Pcp, No ?Pharmacy:   ?[id number] ?[place] ?[place] [address] ?Phone: [phone] Fax:  [phone] ? ?[facility] ?[address] ?[place] [address] ?Phone: [phone] Fax: [phone] ? ? ? ? ?Social Determinants of Health (SDOH) Interventions ?  ? ?Readmission Risk Interventions ?   ? View : No data to display.  ?  ?  ?  ? ? ? ?

## 2021-07-28 NOTE — Progress Notes (Signed)
?[facility] ?Progress Note  ? ?Subjective:   Still feels nauseated, no improvement yet. Denies SOB, CP, palpitations, dizziness.  ? ?Objective ?Vitals:  ? 07/27/21 1836 07/27/21 2044 07/28/21 0213 07/28/21 1028  ?BP: 124/77 134/71  124/74  ?Pulse: 88 85  86  ?Resp: 18 17  18   ?Temp: 98.9 ?F (37.2 ?C) 98.1 ?F (36.7 ?C)  98.5 ?F (36.9 ?C)  ?TempSrc: Oral Oral  Oral  ?SpO2: 98% 96%  98%  ?Weight:      ?Height:   5\' 7"  (1.702 m)   ? ?Physical Exam ?General: WDWN female, alert and in NAD ?Heart: RRR, no murmurs, rubs or gallops ?Lungs: CTA bilaterally, without wheezing, rhonchi or rales ?Abdomen: Soft, non-tender, non-distended, +BS ?Extremities: No edema b/l lower extremities ?Dialysis Access:  LUE AVF  +t/b ? ?Additional Objective ?Labs: ?Basic Metabolic Panel: ?Recent Labs  ?Lab 07/26/21 ?0725 07/26/21 ?1040 07/27/21 ?0831 07/28/21 ?0443  ?NA 142  --  136 135  ?K 4.6  --  4.2 3.9  ?CL 93*  --  93* 95*  ?CO2 22  --  24 22  ?GLUCOSE 97  --  133* 112*  ?BUN 170*  --  100* 100*  ?CREATININE 41.22*  --  29.02* 30.38*  ?CALCIUM 7.6*  --  8.0* 7.4*  ?PHOS  --  >30.0*  --   --   ? ?Liver Function Tests: ?Recent Labs  ?Lab 07/26/21 ?1040  ?AST 12*  ?ALT 14  ?ALKPHOS 52  ?BILITOT 0.6  ?PROT 7.3  ?ALBUMIN 3.7  ? ?CBC: ?Recent Labs  ?Lab 07/25/21 ?1126 07/26/21 ?0626 07/27/21 ?0831 07/28/21 ?0443  ?WBC 12.7* 13.0* 11.6* 10.7*  ?HGB 9.2* 7.9* 8.0* 7.4*  ?HCT 28.8* 24.5* 25.0* 23.9*  ?MCV 81.1 80.3 81.2 82.4  ?PLT 262 231 213 208  ? ?Blood Culture ?No results found for: SDES, [place], [place], REPTSTATUS ? ?Cardiac Enzymes: ?No results for input(s): CKTOTAL, CKMB, CKMBINDEX, TROPONINI in the last 168 hours. ?CBG: ?Recent Labs  ?Lab 07/25/21 ?1833  ?GLUCAP 102*  ? ?Iron Studies:  ?Recent Labs  ?  07/26/21 ?0740  ?IRON 188*  ?TIBC 291  ? ?@lablastinr3 @ ?Studies/Results: ?No results found. ?Medications: ? sodium chloride    ? promethazine (PHENERGAN) injection (IM or IVPB) 6.25 mg (07/28/21 0403)  ? ? atorvastatin   40 mg Oral q1800  ? carvedilol  25 mg Oral BID WC  ? Chlorhexidine Gluconate Cloth  6 each Topical Q0600  ? heparin  5,000 Units Subcutaneous Q8H  ? hydrALAZINE  25 mg Oral Q8H  ? sevelamer carbonate  1,600 mg Oral TID WC  ? sodium chloride flush  3 mL Intravenous Q12H  ? ticagrelor  90 mg Oral BID  ? ? ?Dialysis Orders: ?last HD recorded was June 2022 at [facility] ? - 4/07 hep B Ag neg, and hep B Abs were high/ protective ? ?Assessment/Plan: ?Uremia - w/ ESRD was on HD in 2022 but then stopped going, HD records suggest stopped going in June 2022. Was supposed to follow up with [facility] in the office but never did despite outpatient HD calling frequently and encouraging her to do so. Now is here quite uremic w/ severe fatigue, uremic fetor, N/V and BUN 200, creat 45. Says she would like to resume dialysis. Wants to do PD ultimately - told her she would have to prove good compliance on existing modality (in-center HD) to be considered for home dialysis. 1st HD yesterday, next HD today and will continue MWF schedule for now. Cont  to titrate BFR and times up.   ?HTN/ volume - Started hydralazine 25 tid and ^coreg 25 bid for BP control. Don't think there is any excess volume, didn't tolerate low UF goal on HD here Sat. Keep even next HD.  ?Anemia esrd - Hb declining, 7.4 today. No bleeding reported. Tsat 65% on 4/08. Starting ESA today.  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 Dialysis will help as well  ?  ? ?Anice Paganini, PA-C ?07/28/2021, 11:33 AM  ?[facility] ?Pager: [phone] ? ? ?

## 2021-07-28 NOTE — Plan of Care (Signed)
?  Problem: Education: ?Goal: Knowledge of General Education information will improve ?Description: Including pain rating scale, medication(s)/side effects and non-pharmacologic comfort measures ?Outcome: Progressing ?  ?Problem: Coping: ?Goal: Level of anxiety will decrease ?Outcome: Progressing ?  ?Problem: Nutrition: ?Goal: Adequate nutrition will be maintained ?Outcome: Progressing ?  ?Problem: Safety: ?Goal: Ability to remain free from injury will improve ?Outcome: Progressing ?  ?

## 2021-07-28 NOTE — Progress Notes (Signed)
?  Progress Note ? ? ?PatientHadja Greene EHO:[id number] DOB: [birth year] DOA: 07/25/2021     3 ?DOS: the patient was seen and examined on 07/28/2021 at 11:55 AM ?  ? ? ? ?Brief hospital course: ?Stacie Greene is a 46 y.o. F with ESRD on HD non-adherent no HD since last Thanksgiving, sCHF EF 40%, HTN and CAD last PCI 2021 who presented with few days malaise, weakness, nausea. ? ?IN the ER, BP elevated, Cr 41(!), BUN 170, K 5.3, phos >30.  Admitted for HD restart. ? ? ? ? ?Assessment and Plan: ?* ESRD (end stage renal disease) on dialysis [facility]) ?Stated HD 1 year ago, was at outpt HD center for several months but had to stop because it interfered with her work schedule.  Went about 5-6 months without HD and presented here with BUN 170 mmol/L (and actually Cr >40 mg/dL but surprisingly no acidosis or hyperkalemia or fluid overload).   ? ?Now finished one HD session yesterday, repeat again today. ?- SW consult to re-establish HD center ?- Nephrology consult for routine HD  ? ? ?Anemia of chronic kidney disease ?- Antibiotics per nephrology ? ?HLD (hyperlipidemia) ?-Continue atorvastatin ?  ? ?Chronic combined systolic and diastolic CHF (congestive heart failure) ([place]) ?EF 40% in 2021 with MI, repeated here, EF now back to normal, grade I DD, normal valves. ?-Continue Coreg ? ?Essential hypertension ?Blood pressure normalized ?- Continue hydralazine, carvedilol ? ?Coronary artery disease involving native coronary artery of native heart without angina pectoris ?- Continue atorvastatin, carvedilol, hydralazine ?- Resume aspirin at discharge ? ? ? ? ? ? ? ? ? ?Subjective: Patient still feels a little bit nauseous but much better than yesterday.  No confusion, dyspnea, orthopnea, swelling.  No chest pain. ? ? ? ? ?Physical Exam: ?Vitals:  ? 07/27/21 1836 07/27/21 2044 07/28/21 0213 07/28/21 1028  ?BP: 124/77 134/71  124/74  ?Pulse: 88 85  86  ?Resp: 18 17  18   ?Temp: 98.9 ?F (37.2 ?C) 98.1 ?F (36.7 ?C)  98.5 ?F (36.9 ?C)   ?TempSrc: Oral Oral  Oral  ?SpO2: 98% 96%  98%  ?Weight:      ?Height:   5\' 7"  (1.702 m)   ? ?Adult female, lying in bed, eating lunch, no acute distress, appears tired and somewhat flat, but interactive and appropriate, no confusion, no psychomotor slowing.  RRR, no murmurs, no peripheral edema, no JVD ?Lungs clear without rales or wheezes, abdomen soft without tenderness palpation or guarding, attention normal, affect blunted, judgment insight appear appropriate, moves upper extremities generalized weakness but symmetric strength ? ?Data Reviewed: ?Discussed with nephrology.  Nursing notes reviewed, vital signs reviewed. ?Hemoglobin A1c 6.2%, prediabetes ?Creatinine still greater than 30 ?Echocardiogram shows normal EF ?BUN is 100 ?Hemoglobin is 7.4 which is slightly lower than her baseline ?  ? ?Family Communication:   ? ? ? ?Disposition: ?Status is: Inpatient ?The patient was admitted with uremia due to untreated end-stage renal disease. ? ?She is reestablished on dialysis, as soon as she is able to clip to an outpatient dialysis center she will be ready for discharge. ? ?She has interest in initiating peritoneal dialysis but this will have to be arranged after some period of time as an outpatient ? ? ? ? ? ? ? ? ? ? ? ? ? ?Author: ?Edwin Dada, MD ?07/28/2021 4:15 PM ? ?For on call review [email or web address].  ? ? ?

## 2021-07-28 NOTE — Hospital Course (Addendum)
Stacie Greene is a 46 y.o. F with ESRD on HD non-adherent no HD since last Thanksgiving, sCHF EF 40%, HTN and CAD last PCI 2021 who presented with few days malaise, weakness, nausea. ? ?IN the ER, BP elevated, Cr 41(!), BUN 170, K 5.3, phos >30.  Admitted for HD restart. ?

## 2021-07-29 ENCOUNTER — Other Ambulatory Visit (HOSPITAL_COMMUNITY): Payer: Self-pay

## 2021-07-29 LAB — CBC
HCT: 23.1 % — ABNORMAL LOW (ref 36.0–46.0)
Hemoglobin: 7.3 g/dL — ABNORMAL LOW (ref 12.0–15.0)
MCH: 25.5 pg — ABNORMAL LOW (ref 26.0–34.0)
MCHC: 31.6 g/dL (ref 30.0–36.0)
MCV: 80.8 fL (ref 80.0–100.0)
Platelets: 221 10*3/uL (ref 150–400)
RBC: 2.86 MIL/uL — ABNORMAL LOW (ref 3.87–5.11)
RDW: 13.3 % (ref 11.5–15.5)
WBC: 12.6 10*3/uL — ABNORMAL HIGH (ref 4.0–10.5)
nRBC: 0 % (ref 0.0–0.2)

## 2021-07-29 LAB — RENAL FUNCTION PANEL
Albumin: 3.1 g/dL — ABNORMAL LOW (ref 3.5–5.0)
Anion gap: 11 (ref 5–15)
BUN: 45 mg/dL — ABNORMAL HIGH (ref 6–20)
CO2: 26 mmol/L (ref 22–32)
Calcium: 7.7 mg/dL — ABNORMAL LOW (ref 8.9–10.3)
Chloride: 97 mmol/L — ABNORMAL LOW (ref 98–111)
Creatinine, Ser: 17.39 mg/dL — ABNORMAL HIGH (ref 0.44–1.00)
GFR, Estimated: 2 mL/min — ABNORMAL LOW (ref >60)
Glucose, Bld: 138 mg/dL — ABNORMAL HIGH (ref 70–99)
Phosphorus: 5.7 mg/dL — ABNORMAL HIGH (ref 2.5–4.6)
Potassium: 3.8 mmol/L (ref 3.5–5.1)
Sodium: 134 mmol/L — ABNORMAL LOW (ref 135–145)

## 2021-07-29 LAB — BASIC METABOLIC PANEL
Anion gap: 18 — ABNORMAL HIGH (ref 5–15)
BUN: 105 mg/dL — ABNORMAL HIGH (ref 6–20)
CO2: 25 mmol/L (ref 22–32)
Calcium: 7.2 mg/dL — ABNORMAL LOW (ref 8.9–10.3)
Chloride: 91 mmol/L — ABNORMAL LOW (ref 98–111)
Creatinine, Ser: 31.39 mg/dL — ABNORMAL HIGH (ref 0.44–1.00)
GFR, Estimated: 1 mL/min — ABNORMAL LOW (ref >60)
Glucose, Bld: 107 mg/dL — ABNORMAL HIGH (ref 70–99)
Potassium: 3.6 mmol/L (ref 3.5–5.1)
Sodium: 134 mmol/L — ABNORMAL LOW (ref 135–145)

## 2021-07-29 MED ORDER — AMLODIPINE BESYLATE 5 MG PO TABS
5.0000 mg | ORAL_TABLET | Freq: Every day | ORAL | Status: DC
  Administered 2021-07-29: 5 mg via ORAL
  Filled 2021-07-29: qty 1

## 2021-07-29 MED ORDER — KIDNEY FAILURE BOOK: Freq: Once | Status: AC

## 2021-07-29 MED ORDER — SEVELAMER CARBONATE 800 MG PO TABS
1600.0000 mg | ORAL_TABLET | Freq: Three times a day (TID) | ORAL | 3 refills | Status: AC
Start: 2021-07-29 — End: ?
  Filled 2021-07-29: qty 180, 30d supply, fill #0

## 2021-07-29 MED ORDER — AMLODIPINE BESYLATE 5 MG PO TABS
5.0000 mg | ORAL_TABLET | Freq: Every day | ORAL | 3 refills | Status: DC
  Filled 2021-07-29: qty 30, 30d supply, fill #0

## 2021-07-29 MED ORDER — LOSARTAN POTASSIUM 50 MG PO TABS
50.0000 mg | ORAL_TABLET | Freq: Every day | ORAL | 3 refills | Status: DC
  Filled 2021-07-29: qty 30, 30d supply, fill #0

## 2021-07-29 MED ORDER — CARVEDILOL 25 MG PO TABS
25.0000 mg | ORAL_TABLET | Freq: Two times a day (BID) | ORAL | 3 refills | Status: DC
  Filled 2021-07-29: qty 60, 30d supply, fill #0

## 2021-07-29 MED ORDER — HYDRALAZINE HCL 25 MG PO TABS
25.0000 mg | ORAL_TABLET | Freq: Three times a day (TID) | ORAL | 3 refills | Status: DC
  Filled 2021-07-29: qty 90, 30d supply, fill #0

## 2021-07-29 MED ORDER — ATORVASTATIN CALCIUM 40 MG PO TABS
40.0000 mg | ORAL_TABLET | Freq: Every day | ORAL | 3 refills | Status: DC
  Filled 2021-07-29: qty 30, 30d supply, fill #0

## 2021-07-29 NOTE — Discharge Summary (Signed)
?Physician Discharge Summary ?  ?Patient: Stacie Greene MRN: [id number] DOB: 1975/11/30  ?Admit date:     07/25/2021  ?Discharge date: 07/29/21  ?Discharge Physician: Edwin Dada  ? ?PCP: Pcp, No  ? ? ? ?Recommendations at discharge:  ?Follow up with Nephrology Dr. Moshe Cipro in 1-2 weeks, to discuss new BP meds and peritoneal dialysis ?Follow up with [facility] at 12:15PM on Thursday for TThS schedule ? ? ? ? ? ? ?Discharge Diagnoses: ?Principal Problem: ?  ESRD (end stage renal disease) on dialysis [facility]) ?Active Problems: ?  Coronary artery disease involving native coronary artery of native heart without angina pectoris ?  Essential hypertension ?  Chronic combined systolic and diastolic CHF (congestive heart failure) ([place]) ?  Anemia of chronic kidney failure, stage 5 (HCC) ? ?  ? ? ? ? ?Hospital Course: ?Mrs. Stacie Greene is a 46 y.o. F with ESRD on HD non-adherent no HD since last Thanksgiving, sCHF EF 40%, HTN and CAD last PCI 2021 who presented with few days malaise, weakness, nausea. ? ?IN the ER, BP elevated, Cr 41(!), BUN 170, K 5.3, phos >30.  Admitted for HD restart. ? ? ? ? ?* ESRD (end stage renal disease) on dialysis [facility]) ?Stated HD 1 year ago, was at outpt HD center for several months but had to stop because it interfered with her work schedule.  Went about 5-6 months without HD and presented here with BUN 170 mmol/L (and actually Cr >40 mg/dL but surprisingly no acidosis or hyperkalemia or fluid overload).   ? ?Dialyzed twice and BUN improved, symptoms improved.   ? ?SW consulted and able to secure HD center at former center in [place]. ? ? ? ? ?Anemia of chronic kidney failure, stage 5 (HCC) ?Hgb treated with Aranesp. ? ?Chronic combined systolic and diastolic CHF (congestive heart failure) ([place]) ?EF 40% in 2021 with MI, repeated here, EF now back to normal, grade I DD, normal valves. ? ?Essential hypertension ?Blood pressure elevated.  Started on amlodipine, losartan,  hydralazine.  Coreg continued. ? ?Coronary artery disease involving native coronary artery of native heart without angina pectoris ?  ? ? ? ? ?  ? ? ? ? ? ?[facility] Controlled Substances Registry was reviewed for this patient prior to discharge.  ? ?Consultants: Nephrology ?Procedures performed: Echocardiogram, HD x2  ?Disposition: Home ? ? ?DISCHARGE MEDICATION: ?Allergies as of 07/29/2021   ?No Known Allergies ?  ? ?  ?Medication List  ?  ? ?TAKE these medications   ? ?amLODipine 5 MG tablet ?Commonly known as: NORVASC ?Take 1 tablet (5 mg total) by mouth daily. ?Start taking on: July 30, 2021 ?  ?aspirin EC 81 MG tablet ?Take 81 mg by mouth daily. Swallow whole. ?  ?atorvastatin 40 MG tablet ?Commonly known as: LIPITOR ?Take 1 tablet (40 mg total) by mouth daily at 6 PM. Schedule an appointment with cardiology for further refills, 1st attempt ?What changed: additional instructions ?  ?carvedilol 25 MG tablet ?Commonly known as: COREG ?Take 1 tablet (25 mg total) by mouth 2 (two) times daily with a meal. ?What changed:  ?medication strength ?how much to take ?  ?hydrALAZINE 25 MG tablet ?Commonly known as: APRESOLINE ?Take 1 tablet (25 mg total) by mouth every 8 (eight) hours. ?  ?losartan 50 MG tablet ?Commonly known as: Cozaar ?Take 1 tablet (50 mg total) by mouth daily. ?  ?nitroGLYCERIN 0.4 MG SL tablet ?Commonly known as: NITROSTAT ?Place 1 tablet (0.4 mg total)  under the tongue every 5 (five) minutes as needed for chest pain. ?  ?sevelamer carbonate 800 MG tablet ?Commonly known as: RENVELA ?Take 2 tablets (1,600 mg total) by mouth 3 (three) times daily with meals. ?  ?ticagrelor 90 MG Tabs tablet ?Commonly known as: BRILINTA ?Take 1 tablet (90 mg total) by mouth 2 (two) times daily. ?  ? ?  ? ? Follow-up Information   ? ? [facility] on 07/31/2021.   ?Why: Schedule is Tuesday/Thursday/Saturday with 12:30p chair time.  ?You will need to arrive at 12:15.  ?You can resume there on  Thursday. ?Contact information: ?[address] [place] ?[place] [address] ?[phone] ? ? ?  ?  ? ? Corliss Parish, MD. Schedule an appointment as soon as possible for a visit in 1 week(s).   ?Specialty: Nephrology ?Contact information: ?[address] ?[place] [address] ?[phone] ? ? ?  ?  ? ?  ?  ? ?  ? ? ?Discharge Instructions   ? ? Discharge instructions   Complete by: As directed ?  ? From Dr. Loleta Books: ?You were admitted for very high "blood urea nitrogen" (BUN) which is a natural substance that your body produces in breaking down proteins.  ?Normally it is cleared out by the kidneys or dialysis if the kidneys fail. ? ?In your case, the BUN level was very very high.  This makes people tired, weak, and nauseated. ? ?Attend dialysis according to the schedule you were given, starting this Thursday at 12:15pm. ? ?Control your blood pressure: ?Take carvedilol ?Take the new blood pressure medicines amlodipine, hydralazine, and losartan ? ?Take carvedilol 25 mg twice daily ?Take amlodipine 5 mg once daily ?Take hydralazine 25 mg three times daily ?Take losartan 50 mg once daily ? ?Call your kidney doctor also for a specific appoitnment to talk about peritoneal dialysis ?Call Dr. Moshe Cipro, your kidney doctor, at the number below ? ? ?Resume your aspirin, atorvastatin and Brilinta and call your heart doctor to check on how long you have to take the Brilinta for (the aspirin and atorvastatin you should take forever) ? ? ?Lastly, take sevelemer, because this is to keep the phosphate level in your blood down  ? Increase activity slowly   Complete by: As directed ?  ? ?  ? ? ?Discharge Exam: ?Filed Weights  ? 07/26/21 0955 07/29/21 0130 07/29/21 0430  ?Weight: 66.7 kg 69 kg 69 kg  ? ? ?General: Pt is alert, awake, not in acute distress ?Cardiovascular: RRR, nl S1-S2, no murmurs appreciated.   No LE edema.   ?Respiratory: Normal respiratory rate and rhythm.  CTAB without rales or wheezes. ?Abdominal: Abdomen soft  and non-tender.  No distension or HSM.   ?Neuro/Psych: Strength symmetric in upper and lower extremities.  Judgment and insight appear normal. ? ? ?Condition at discharge: good ? ?The results of significant diagnostics from this hospitalization (including imaging, microbiology, ancillary and laboratory) are listed below for reference.  ? ?Imaging Studies: ?ECHOCARDIOGRAM COMPLETE ? ?Result Date: 07/25/2021 ?   ECHOCARDIOGRAM REPORT   Patient Name:   MAKELLA BUCKINGHAM Date of Exam: 07/25/2021 Medical Rec #:  [id number]     Height:       67.0 in Accession #:    [id number]    Weight:       150.7 lb Date of Birth:  1975/06/16     BSA:          1.793 m? Patient Age:    46 years  BP:           199/114 mmHg Patient Gender: F             HR:           75 bpm. Exam Location:  Inpatient Procedure: 2D Echo Indications:    acute diastolic chf  History:        Patient has prior history of Echocardiogram examinations, most                 recent 03/28/2020. Cardiomyopathy, end stage renal disease; Risk                 Factors:Hypertension.  Sonographer:    Johny Chess RDCS Referring Phys: [place]  1. Left ventricular ejection fraction, by estimation, is 60 to 65%. The left ventricle has normal function. The left ventricle has no regional wall motion abnormalities. There is moderate left ventricular hypertrophy. Left ventricular diastolic parameters are consistent with Grade I diastolic dysfunction (impaired relaxation).  2. Right ventricular systolic function is normal. The right ventricular size is normal.  3. The mitral valve is normal in structure. Trivial mitral valve regurgitation. No evidence of mitral stenosis.  4. The aortic valve is normal in structure. Aortic valve regurgitation is not visualized. No aortic stenosis is present.  5. The inferior vena cava is normal in size with greater than 50% respiratory variability, suggesting right atrial pressure of 3 mmHg. FINDINGS  Left Ventricle: Left  ventricular ejection fraction, by estimation, is 60 to 65%. The left ventricle has normal function. The left ventricle has no regional wall motion abnormalities. The left ventricular internal cavity size was

## 2021-07-29 NOTE — Progress Notes (Signed)
DISCHARGE NOTE HOME ?Jadea Boyan to be discharged Home per MD order. Discussed prescriptions and follow up appointments with the patient. Prescriptions given to patient; medication list explained in detail. Patient verbalized understanding. ? ?Skin clean, dry and intact without evidence of skin break down, no evidence of skin tears noted. IV catheter discontinued intact. Site without signs and symptoms of complications. Dressing and pressure applied. Pt denies pain at the site currently. No complaints noted. ? ?Patient free of lines, drains, and wounds.  ? ?An After Visit Summary (AVS) was printed and given to the patient. ?Patient escorted via wheelchair, and discharged home via private auto. ? ?Arlyss Repress, RN  ?

## 2021-07-29 NOTE — Progress Notes (Signed)
Rounded on patient today in correlation to transition to outpatient HD. Kidney Failure book given. Patient educated at the bedside regarding care of AV fistula, assessment of thrill daily and proper medication administration on HD days.  Patient also educated on the importance of adhering to scheduled dialysis treatments, the effects of fluid overload, hyperkalemia and hyperphosphatemia. Patient capable of re-verbalizing via teach back method. Also educated patient on services available through the interdisciplinary team in the clinic setting. Patient verbalized that she is interested in PD. Educated patient briefly regarding PD including the risk and benefits. Patient is clipped to [facility] SW. Patient with no further questions at this time. Handouts and contact information provided to patient for any further assistance. Will follow as appropriate. ?

## 2021-07-29 NOTE — Evaluation (Signed)
Physical Therapy Evaluation ?Patient Details ?Name: Stacie Greene ?MRN: [id number] ?DOB: 16-Jun-1975 ?Today's Date: 07/29/2021 ? ?History of Present Illness ? Pt is a 46 y/o female presenting on 4/7 with malaise, weakness, nausea. Admitted with uremia due to untreated ESRD. PMH includes: ESRD on HD (non adherent to no HD since nov), CHF EF 40%, HTN, CAD.  ?Clinical Impression ?  ?Patient evaluated by Physical Therapy with no further acute PT needs identified. All education has been completed and the patient has no further questions. Fatigued after HD, but managed a flight of steps well. See below for any follow-up Physical Therapy or equipment needs. PT is signing off. Thank you for this referral. ?   ?   ? ?Recommendations for follow up therapy are one component of a multi-disciplinary discharge planning process, led by the attending physician.  Recommendations may be updated based on patient status, additional functional criteria and insurance authorization. ? ?Follow Up Recommendations No PT follow up ? ?  ?Assistance Recommended at Discharge None  ?Patient can return home with the following ?   ? ?  ?Equipment Recommendations None recommended by PT  ?Recommendations for Other Services ?    ?  ?Functional Status Assessment Patient has not had a recent decline in their functional status  ? ?  ?Precautions / Restrictions Precautions ?Precautions: None  ? ?  ? ?Mobility ? Bed Mobility ?Overal bed mobility: Independent ?  ?  ?  ?  ?  ?  ?  ?  ? ?Transfers ?Overall transfer level: Independent ?  ?  ?  ?  ?  ?  ?  ?  ?  ?  ? ?Ambulation/Gait ?Ambulation/Gait assistance: Independent ?Gait Distance (Feet): 200 Feet ?Assistive device: None ?Gait Pattern/deviations: WFL(Within Functional Limits) ?  ?  ?  ?General Gait Details: No difficulty ? ?Stairs ?Stairs: Yes ?Stairs assistance: Modified independent (Device/Increase time) ?Stair Management: One rail Left, Forwards, Alternating pattern ?Number of Stairs: 12 ?General stair  comments: No difficulty ? ?Wheelchair Mobility ?  ? ?Modified Rankin (Stroke Patients Only) ?  ? ?  ? ?Balance Overall balance assessment: No apparent balance deficits (not formally assessed) ?  ?  ?  ?  ?  ?  ?  ?  ?  ?  ?  ?  ?  ?  ?  ?  ?  ?  ?   ? ? ? ?Pertinent Vitals/Pain Pain Assessment ?Pain Assessment: No/denies pain  ? ? ?Home Living Family/patient expects to be discharged to:: Private residence ?Living Arrangements: Alone ?  ?Type of Home: Apartment ?Home Access: Stairs to enter ?Entrance Stairs-Rails: Left ?Entrance Stairs-Number of Steps: flight up to 2nd floor ?  ?Home Layout: One level ?Home Equipment: None ?   ?  ?Prior Function Prior Level of Function : Independent/Modified Independent ?  ?  ?  ?  ?  ?  ?  ?ADLs Comments: drives, works at the post office ?  ? ? ?Hand Dominance  ?   ? ?  ?Extremity/Trunk Assessment  ? Upper Extremity Assessment ?Upper Extremity Assessment: Overall WFL for tasks assessed ?  ? ?Lower Extremity Assessment ?Lower Extremity Assessment: Overall WFL for tasks assessed ?  ? ?   ?Communication  ? Communication: No difficulties  ?Cognition Arousal/Alertness: Awake/alert ?Behavior During Therapy: Flat affect ?Overall Cognitive Status: Within Functional Limits for tasks assessed ?  ?  ?  ?  ?  ?  ?  ?  ?  ?  ?  ?  ?  ?  ?  ?  ?  ?  ?  ? ?  ?  General Comments General comments (skin integrity, edema, etc.): Discussed ESRD education, pt reports no questions ? ?  ?Exercises    ? ?Assessment/Plan  ?  ?PT Assessment Patient does not need any further PT services  ?PT Problem List   ? ?   ?  ?PT Treatment Interventions     ? ?PT Goals (Current goals can be found in the Care Plan section)  ?Acute Rehab PT Goals ?Patient Stated Goal: be able to rest ?PT Goal Formulation: All assessment and education complete, DC therapy ? ?  ?Frequency   ?  ? ? ?Co-evaluation   ?  ?  ?  ?  ? ? ?  ?AM-PAC PT "6 Clicks" Mobility  ?Outcome Measure Help needed turning from your back to your side while in a  flat bed without using bedrails?: None ?Help needed moving from lying on your back to sitting on the side of a flat bed without using bedrails?: None ?Help needed moving to and from a bed to a chair (including a wheelchair)?: None ?Help needed standing up from a chair using your arms (e.g., wheelchair or bedside chair)?: None ?Help needed to walk in hospital room?: None ?Help needed climbing 3-5 steps with a railing? : None ?6 Click Score: 24 ? ?  ?End of Session   ?Activity Tolerance: Patient tolerated treatment well ?Patient left: in bed;with call bell/phone within reach ?Nurse Communication: Mobility status ?PT Visit Diagnosis: Other abnormalities of gait and mobility (R26.89) ?  ? ?Time: 3428-7681 ?PT Time Calculation (min) (ACUTE ONLY): 10 min ? ? ?Charges:   PT Evaluation ?$PT Eval Low Complexity: 1 Low ?  ?  ?   ? ? ?Roney Marion, PT  ?Acute Rehabilitation Services ?Office [phone] ? ? ?Colletta [place] ?07/29/2021, 1:52 PM ? ?

## 2021-07-29 NOTE — Progress Notes (Addendum)
Pt has been accepted back at [facility] on a TTS schedule. Pt will need to arrive at 12:15 for 12:30 chair time. Pt can resume on Thursday. Met with pt at bedside to provide above details. Information sheet provided with details noted as well. Pt voices understanding and agreeable to plan. Update provided to attending, charge RN, and renal PA. Arrangements added to AVS as well.  ? ?Melven Sartorius ?Renal Navigator ?[phone] ? ?Addendum at 4:08 pm: ?Per attending, pt will d/c to home today and resume at out-pt clinic on Thursday. Attending states discussing with nephrology. Contacted [facility] and spoke to [place]. Clinic advised that pt will d/c today and resume at clinic on Thursday.  ?

## 2021-07-29 NOTE — Evaluation (Signed)
Occupational Therapy Evaluation ?Patient Details ?Name: Stacie Greene ?MRN: [id number] ?DOB: Sep 19, 1975 ?Today's Date: 07/29/2021 ? ? ?History of Present Illness Pt is a 46 y/o female presenting on 4/7 with malaise, weakness, nausea. Admitted with uremia due to untreated ESRD. PMH includes: ESRD on HD (non adherent to no HD since nov), CHF EF 40%, HTN, CAD.  ? ?Clinical Impression ?  ?PTA patient independent and working. Presents at baseline independent level for ADLs, mobility limited by decreased activity tolerance and generalized fatigue. Reviewed energy conservation techniques and activity modification recommendations, especially for HD days with pt verbalizing understanding. No further OT needs have been identified and OT will sign off.  ?   ? ?Recommendations for follow up therapy are one component of a multi-disciplinary discharge planning process, led by the attending physician.  Recommendations may be updated based on patient status, additional functional criteria and insurance authorization.  ? ?Follow Up Recommendations ? No OT follow up  ?  ?Assistance Recommended at Discharge None  ?Patient can return home with the following   ? ?  ?Functional Status Assessment ? Patient has not had a recent decline in their functional status  ?Equipment Recommendations ? None recommended by OT  ?  ?Recommendations for Other Services   ? ? ?  ?Precautions / Restrictions Precautions ?Precautions: None  ? ?  ? ?Mobility Bed Mobility ?Overal bed mobility: Independent ?  ?  ?  ?  ?  ?  ?  ?  ? ?Transfers ?Overall transfer level: Independent ?  ?  ?  ?  ?  ?  ?  ?  ?  ?  ? ?  ?Balance Overall balance assessment: No apparent balance deficits (not formally assessed) ?  ?  ?  ?  ?  ?  ?  ?  ?  ?  ?  ?  ?  ?  ?  ?  ?  ?  ?   ? ?ADL either performed or assessed with clinical judgement  ? ?ADL Overall ADL's : Independent ?  ?  ?  ?  ?  ?  ?  ?  ?  ?  ?  ?  ?  ?  ?  ?  ?  ?  ?  ?   ? ? ? ?Vision   ?Vision Assessment?: No apparent  visual deficits  ?   ?Perception   ?  ?Praxis   ?  ? ?Pertinent Vitals/Pain Pain Assessment ?Pain Assessment: No/denies pain  ? ? ? ?Hand Dominance   ?  ?Extremity/Trunk Assessment Upper Extremity Assessment ?Upper Extremity Assessment: Overall WFL for tasks assessed ?  ?Lower Extremity Assessment ?Lower Extremity Assessment: Defer to PT evaluation ?  ?  ?  ?Communication Communication ?Communication: No difficulties ?  ?Cognition Arousal/Alertness: Awake/alert ?Behavior During Therapy: Flat affect ?Overall Cognitive Status: Within Functional Limits for tasks assessed ?  ?  ?  ?  ?  ?  ?  ?  ?  ?  ?  ?  ?  ?  ?  ?  ?  ?  ?  ?General Comments  pt reports fatigued from dialysis, educated on energy conservation techniques and activity modifications. ? ?  ?Exercises   ?  ?Shoulder Instructions    ? ? ?Home Living Family/patient expects to be discharged to:: Private residence ?Living Arrangements: Alone ?  ?Type of Home: Apartment ?Home Access: Stairs to enter ?Entrance Stairs-Number of Steps: flight up to 2nd floor ?Entrance Stairs-Rails: Left ?Home Layout: One level ?  ?  ?  Bathroom Shower/Tub: Tub/shower unit ?  ?Bathroom Toilet: Standard ?  ?  ?Home Equipment: None ?  ?  ?  ? ?  ?Prior Functioning/Environment Prior Level of Function : Independent/Modified Independent ?  ?  ?  ?  ?  ?  ?  ?ADLs Comments: drives, works at the post office ?  ? ?  ?  ?OT Problem List:   ?  ?   ?OT Treatment/Interventions:    ?  ?OT Goals(Current goals can be found in the care plan section) Acute Rehab OT Goals ?Patient Stated Goal: home ?OT Goal Formulation: With patient  ?OT Frequency:   ?  ? ?Co-evaluation   ?  ?  ?  ?  ? ?  ?AM-PAC OT "6 Clicks" Daily Activity     ?Outcome Measure Help from another person eating meals?: None ?Help from another person taking care of personal grooming?: None ?Help from another person toileting, which includes using toliet, bedpan, or urinal?: None ?Help from another person bathing (including washing,  rinsing, drying)?: None ?Help from another person to put on and taking off regular upper body clothing?: None ?Help from another person to put on and taking off regular lower body clothing?: None ?6 Click Score: 24 ?  ?End of Session Nurse Communication: Mobility status ? ?Activity Tolerance: Patient tolerated treatment well ?Patient left: in bed;with call bell/phone within reach ? ?OT Visit Diagnosis: Muscle weakness (generalized) (M62.81)  ?              ?Time: 5956-3875 ?OT Time Calculation (min): 11 min ?Charges:  OT General Charges ?$OT Visit: 1 Visit ?OT Evaluation ?$OT Eval Low Complexity: 1 Low ? ?Jolaine Artist, OT ?Acute Rehabilitation Services ?Pager [phone] ?Office [phone] ? ? ?Delight Stare ?07/29/2021, 8:45 AM ?

## 2021-07-29 NOTE — Progress Notes (Signed)
?[facility] ?Progress Note  ? ?Subjective:   Had HD again overnight, tolerated well. Reports her nausea is better. No edema/SOB/CP. Has been accepted to [facility] on TTS schedule.  ? ?Objective ?Vitals:  ? 07/29/21 0415 07/29/21 0430 07/29/21 4259 07/29/21 0949  ?BP: (!) 166/93 (!) 176/98 140/79 139/75  ?Pulse: 94 90 (!) 107 (!) 110  ?Resp:  18 17 15   ?Temp:  98.5 ?F (36.9 ?C) 99 ?F (37.2 ?C) 98.4 ?F (36.9 ?C)  ?TempSrc:  Oral Oral Oral  ?SpO2:  99% 99% 98%  ?Weight:  69 kg    ?Height:      ? ?Physical Exam ?General: WDWN female, alert and in NAD ?Heart: RRR, no murmurs, rubs or gallops ?Lungs: CTA bilaterally, without wheezing, rhonchi or rales ?Abdomen: Soft, non-tender, non-distended, +BS ?Extremities: No edema b/l lower extremities ?Dialysis Access:  LUE AVF  +t/b ? ?Additional Objective ?Labs: ?Basic Metabolic Panel: ?Recent Labs  ?Lab 07/26/21 ?1040 07/27/21 ?0831 07/28/21 ?5638 07/29/21 ?7564  ?NA  --  136 135 134*  ?K  --  4.2 3.9 3.6  ?CL  --  93* 95* 91*  ?CO2  --  24 22 25   ?GLUCOSE  --  133* 112* 107*  ?BUN  --  100* 100* 105*  ?CREATININE  --  29.02* 30.38* 31.39*  ?CALCIUM  --  8.0* 7.4* 7.2*  ?PHOS >30.0*  --   --   --   ? ?Liver Function Tests: ?Recent Labs  ?Lab 07/26/21 ?1040  ?AST 12*  ?ALT 14  ?ALKPHOS 52  ?BILITOT 0.6  ?PROT 7.3  ?ALBUMIN 3.7  ? ?No results for input(s): LIPASE, AMYLASE in the last 168 hours. ?CBC: ?Recent Labs  ?Lab 07/25/21 ?1126 07/26/21 ?0626 07/27/21 ?0831 07/28/21 ?3329 07/29/21 ?5188  ?WBC 12.7* 13.0* 11.6* 10.7* 12.6*  ?HGB 9.2* 7.9* 8.0* 7.4* 7.3*  ?HCT 28.8* 24.5* 25.0* 23.9* 23.1*  ?MCV 81.1 80.3 81.2 82.4 80.8  ?PLT 262 231 213 208 221  ? ?Blood Culture ?No results found for: SDES, [place], [place], REPTSTATUS ? ?Cardiac Enzymes: ?No results for input(s): CKTOTAL, CKMB, CKMBINDEX, TROPONINI in the last 168 hours. ?CBG: ?Recent Labs  ?Lab 07/25/21 ?1833  ?GLUCAP 102*  ? ?Iron Studies: No results for input(s): IRON, TIBC, TRANSFERRIN, FERRITIN in the last  72 hours. ?@lablastinr3 @ ?Studies/Results: ?No results found. ?Medications: ? sodium chloride    ? promethazine (PHENERGAN) injection (IM or IVPB) Stopped (07/29/21 4166)  ? ? amLODipine  5 mg Oral Daily  ? atorvastatin  40 mg Oral q1800  ? carvedilol  25 mg Oral BID WC  ? Chlorhexidine Gluconate Cloth  6 each Topical Q0600  ? darbepoetin (ARANESP) injection - DIALYSIS  40 mcg Intravenous Q Mon-HD  ? heparin  5,000 Units Subcutaneous Q8H  ? hydrALAZINE  25 mg Oral Q8H  ? sevelamer carbonate  1,600 mg Oral TID WC  ? sodium chloride flush  3 mL Intravenous Q12H  ? ticagrelor  90 mg Oral BID  ? ? ?Dialysis Orders: ?last HD recorded was June 2022 at [facility] ? - 4/07 hep B Ag neg, and hep B Abs were high/ protective ? ?Assessment/Plan: ?Uremia - w/ ESRD was on HD in 2022 but then stopped going, HD records suggest stopped going in June 2022. Was supposed to follow up with [facility] in the office but never did despite outpatient HD calling frequently and encouraging her to do so. Now is here quite uremic w/ severe fatigue, uremic fetor, N/V and BUN 200, creat 45.  Says she would like to resume dialysis. Wants to do PD ultimately - told her she would have to prove good compliance on existing modality (in-center HD) to be considered for home dialysis. had HD on 4/8 (syncopal episode)  and 4/11 (tolerated well). Titrating Rx slowly. Accepted to [facility] on TTS schedule, will check post HD labs and make sure BUN/Cr are coming down.  ?HTN/ volume - Started hydralazine 25 tid and ^coreg 25 bid for BP control. Don't think there is any excess volume, didn't tolerate low UF goal on HD here Sat. Keep even next HD.  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 No bleeding reported. Tsat 65% on 4/08. 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 Dialysis will help as well    ? ?Anice Paganini, PA-C ?07/29/2021, 10:56 AM  ?[facility] ?Pager: [phone] ? ? ?

## 2021-07-30 ENCOUNTER — Telehealth: Payer: Self-pay | Admitting: Physician Assistant

## 2021-07-30 NOTE — Telephone Encounter (Signed)
Transition of care contact from inpatient facility ? ?Date of Discharge: 07/29/21 ?Date of Contact:07/30/21 ?Method of contact: Phone ? ?Attempted to contact patient to discuss transition of care from inpatient admission. Patient did not answer the phone. Unable to leave message. Will plan to follow up with patient at dialysis center this week. ? ?Anice Paganini, PA-C ?07/30/2021, 1:33 PM  ?[facility] ?Pager: [phone] ? ? ?

## 2021-08-04 ENCOUNTER — Ambulatory Visit (INDEPENDENT_AMBULATORY_CARE_PROVIDER_SITE_OTHER): Payer: POS | Admitting: Family

## 2021-08-04 ENCOUNTER — Encounter: Payer: Self-pay | Admitting: Family

## 2021-08-04 VITALS — BP 150/78 | HR 98 | Temp 98.7°F | Ht 66.0 in | Wt 156.2 lb

## 2021-08-04 DIAGNOSIS — U071 COVID-19: Secondary | ICD-10-CM | POA: Insufficient documentation

## 2021-08-04 DIAGNOSIS — N186 End stage renal disease: Secondary | ICD-10-CM | POA: Diagnosis not present

## 2021-08-04 DIAGNOSIS — E875 Hyperkalemia: Secondary | ICD-10-CM | POA: Insufficient documentation

## 2021-08-04 DIAGNOSIS — Z23 Encounter for immunization: Secondary | ICD-10-CM | POA: Diagnosis not present

## 2021-08-04 DIAGNOSIS — I10 Essential (primary) hypertension: Secondary | ICD-10-CM

## 2021-08-04 DIAGNOSIS — Z992 Dependence on renal dialysis: Secondary | ICD-10-CM | POA: Diagnosis not present

## 2021-08-04 HISTORY — DX: COVID-19: U07.1

## 2021-08-04 NOTE — Assessment & Plan Note (Signed)
Chronic - non-compliant w/meds in past, recently hospitalized w/uremia d/t missing dialysis, pt reports taking her meds daily since. BP elevated today, has not cuff at home. Advised increasing Amlodipine to 7.5mg  qhs, continue Losartan qam, Hydralazine tid, Coreg bid. f/u 1 mos. ?

## 2021-08-04 NOTE — Patient Instructions (Signed)
Welcome to [facility]! It was a pleasure meeting you today. ? ?Increase your Amlodipine to 7.5 mg daily and take this at bedtime.  ?Continue to take the Losartan in the morning, Carvedilol twice a day, and the Hydralazine 3 times per day as ordered. ?Call the cardiology office and schedule a follow up appointment and let them know you were discharged from the hospital recently.  ? ?Do NOT run out of your medicines, let us know ahead of time when you need refills, or your kidney doctor. ? ?Schedule a follow up in 1 month to recheck your blood pressure after you increase the Amlodipine, or let me know if it is running lower at dialysis - let them know I am now your primary care provider. ? ? ? ? ? ?PLEASE NOTE: ? ?If you had any LAB tests please let us know if you have not heard back within a few days. You may see your results on MyChart before we have a chance to review them but we will give you a call once they are reviewed by [place]. If we ordered any REFERRALS today, please let us know if you have not heard from their office within the next week.  ?Let us know through MyChart if you are needing REFILLS, or have your pharmacy send [place] the request. You can also use MyChart to communicate with me or any office staff. ? ? ?

## 2021-08-04 NOTE — Assessment & Plan Note (Addendum)
Chronic - reviewed ED & nephrology notes - started HD a year ago (after having Covid)  then went off for 10mos d/t work, hospitalized recently w/uremia, compliant w/dialysis since. reports her mother & sister have been staying with her recently, they live in [place]. peritoneal dialysis being considered.  ?

## 2021-08-04 NOTE — Progress Notes (Signed)
? ?New Patient Office Visit ? ?Subjective:  ?Patient ID: Stacie Greene, female    DOB: Oct 26, 1975  Age: 46 y.o. MRN: [id number] ? ?CC:  ?Chief Complaint  ?Patient presents with  ? Establish Care  ? ? ?HPI ?Stacie Greene presents for establishing care.  ?Hypertension: Patient is currently maintained on the following medications for blood pressure: Amlodipine, Losartan, Hydralazine, Coreg ?Failed meds include: none ?Patient reports good compliance with blood pressure medications. ?Patient denies chest pain, headaches, shortness of breath or swelling. ?Last 3 blood pressure readings in our office are as follows: ?BP Readings from Last 3 Encounters:  ?08/04/21 (!) 150/78  ?07/29/21 139/75  ?10/20/20 118/68  ?ESRD on dialysis:   pt reports having CKD w/HTN prior to last year, but after she had Covid it worsened her kidney fx and she had to start dialysis. Pt was non-compliant with tx due to trying to work and skipped sessions,  ended up in hospital recently with uremia. Has since restarted dialysis, but is considering peritoneal if insurance will cover. ? ?Past Medical History:  ?Diagnosis Date  ? Acute on chronic kidney failure ([place]) 06/01/2019  ? Chronic kidney disease 07/12/2019  ? Cyst of ovary 01/24/2020  ? ESRD due to hypertension ([place])   ? Heart attack ([place]) 2021  ? Hematuria 01/24/2020  ? Herpes   ? Hypertension secondary to other renal disorders 01/24/2020  ? ?Past Surgical History:  ?Procedure Laterality Date  ? APPENDECTOMY    ? COLONOSCOPY WITH PROPOFOL N/A 10/18/2020  ? Procedure: COLONOSCOPY WITH PROPOFOL;  Surgeon: Jackquline Denmark, MD;  Location: [facility] ENDOSCOPY;  Service: Gastroenterology;  Laterality: N/A;  ? COLONOSCOPY WITH PROPOFOL N/A 10/19/2020  ? Procedure: COLONOSCOPY WITH PROPOFOL;  Surgeon: Mauri Pole, MD;  Location: [place] ENDOSCOPY;  Service: Endoscopy;  Laterality: N/A;  ? CORONARY STENT INTERVENTION N/A 03/29/2020  ? Procedure: CORONARY STENT INTERVENTION;  Surgeon: Burnell Blanks, MD;   Location: [place] CV LAB;  Service: Cardiovascular;  Laterality: N/A;  ? ESOPHAGOGASTRODUODENOSCOPY (EGD) WITH PROPOFOL N/A 10/18/2020  ? Procedure: ESOPHAGOGASTRODUODENOSCOPY (EGD) WITH PROPOFOL;  Surgeon: Jackquline Denmark, MD;  Location: [place];  Service: Gastroenterology;  Laterality: N/A;  ? HOT HEMOSTASIS N/A 10/18/2020  ? Procedure: HOT HEMOSTASIS (ARGON PLASMA COAGULATION/BICAP);  Surgeon: Jackquline Denmark, MD;  Location: [facility] ENDOSCOPY;  Service: Gastroenterology;  Laterality: N/A;  ? HYSTEROPLASTY    ? INTRAVASCULAR ULTRASOUND/IVUS N/A 03/29/2020  ? Procedure: Intravascular Ultrasound/IVUS;  Surgeon: Burnell Blanks, MD;  Location: [place] CV LAB;  Service: Cardiovascular;  Laterality: N/A;  ? LEFT HEART CATH AND CORONARY ANGIOGRAPHY N/A 03/29/2020  ? Procedure: LEFT HEART CATH AND CORONARY ANGIOGRAPHY;  Surgeon: Burnell Blanks, MD;  Location: [place] CV LAB;  Service: Cardiovascular;  Laterality: N/A;  ? THYROID SURGERY    ? ? ?History reviewed. No pertinent family history. ? ?Social History  ? ?Socioeconomic History  ? Marital status: Single  ?  Spouse name: Not on file  ? Number of children: 2  ? Years of education: Not on file  ? Highest education level: Not on file  ?Occupational History  ? Not on file  ?Tobacco Use  ? Smoking status: Former  ?  Types: Cigarettes  ?  Quit date: 03/29/2020  ?  Years since quitting: 1.3  ? Smokeless tobacco: Never  ?Vaping Use  ? Vaping Use: Never used  ?Substance and Sexual Activity  ? Alcohol use: Not Currently  ? Drug use: Not Currently  ?  Types: Marijuana  ? Sexual activity:  Yes  ?  Birth control/protection: Condom  ?Other Topics Concern  ? Not on file  ?Social History Narrative  ? Not on file  ? ?Social Determinants of Health  ? ?Financial Resource Strain: Not on file  ?Food Insecurity: Not on file  ?Transportation Needs: Not on file  ?Physical Activity: Not on file  ?Stress: Not on file  ?Social Connections: Not on file  ?Intimate  Partner Violence: Not on file  ? ? ?Objective:  ? ?Today's Vitals: BP (!) 150/78 (BP Location: Left Arm, Patient Position: Sitting, Cuff Size: Large)   Pulse 98   Temp 98.7 ?F (37.1 ?C) (Temporal)   Ht 5\' 6"  (1.676 m)   Wt 156 lb 4 oz (70.9 kg)   SpO2 94%   BMI 25.22 kg/m?  ? ?Physical Exam ?Vitals and nursing note reviewed.  ?Constitutional:   ?   Appearance: Normal appearance.  ?Cardiovascular:  ?   Rate and Rhythm: Normal rate and regular rhythm.  ?Pulmonary:  ?   Effort: Pulmonary effort is normal.  ?   Breath sounds: Normal breath sounds.  ?Musculoskeletal:     ?   General: Normal range of motion.  ?Skin: ?   General: Skin is warm and dry.  ?Neurological:  ?   Mental Status: She is alert.  ?Psychiatric:     ?   Mood and Affect: Mood normal.     ?   Behavior: Behavior normal.  ? ? ?Assessment & Plan:  ? ?Problem List Items Addressed This Visit   ? ?  ? Cardiovascular and Mediastinum  ? Essential hypertension - Primary  ?  Chronic - non-compliant w/meds in past, recently hospitalized w/uremia d/t missing dialysis, pt reports taking her meds daily since. BP elevated today, has not cuff at home. Advised increasing Amlodipine to 7.5mg  qhs, continue Losartan qam, Hydralazine tid, Coreg bid. f/u 1 mos. ? ?  ?  ?  ? Genitourinary  ? ESRD (end stage renal disease) on dialysis [facility])  ?  Chronic - reviewed ED & nephrology notes - started HD a year ago (after having Covid)  then went off for 60mos d/t work, hospitalized recently w/uremia, compliant w/dialysis since. reports her mother & sister have been staying with her recently, they live in [place]. peritoneal dialysis being considered.  ? ?  ?  ? ?Other Visit Diagnoses   ? ? Need for prophylactic vaccination with combined diphtheria-tetanus-pertussis (DTP) vaccine      ? Relevant Orders  ? Tdap vaccine greater than or equal to 7yo IM (Completed)  ? ?  ? ?Outpatient Encounter Medications as of 08/04/2021  ?Medication Sig  ? amLODipine (NORVASC) 5 MG tablet Take 1  tablet (5 mg total) by mouth daily.  ? aspirin EC 81 MG tablet Take 81 mg by mouth daily. Swallow whole.  ? atorvastatin (LIPITOR) 40 MG tablet Take 1 tablet (40 mg total) by mouth daily at 6 PM. Schedule an appointment with cardiology for further refills, 1st attempt  ? carvedilol (COREG) 25 MG tablet Take 1 tablet (25 mg total) by mouth 2 (two) times daily with a meal.  ? hydrALAZINE (APRESOLINE) 25 MG tablet Take 1 tablet (25 mg total) by mouth every 8 (eight) hours.  ? losartan (COZAAR) 50 MG tablet Take 1 tablet (50 mg total) by mouth daily.  ? sevelamer carbonate (RENVELA) 800 MG tablet Take 2 tablets (1,600 mg total) by mouth 3 (three) times daily with meals.  ? ticagrelor (BRILINTA) 90 MG TABS tablet Take 1  tablet (90 mg total) by mouth 2 (two) times daily.  ? [DISCONTINUED] calcium acetate (PHOSLO) 667 MG capsule Take 667 mg by mouth 3 (three) times daily. (Patient not taking: Reported on 08/04/2021)  ? [DISCONTINUED] nitroGLYCERIN (NITROSTAT) 0.4 MG SL tablet Place 1 tablet (0.4 mg total) under the tongue every 5 (five) minutes as needed for chest pain. (Patient not taking: Reported on 10/16/2020)  ? ?No facility-administered encounter medications on file as of 08/04/2021.  ? ? ?Follow-up: Return in about 4 weeks (around 09/01/2021) for blood pressure and fasting labs.  ? ?[name], NP ?

## 2021-08-05 ENCOUNTER — Telehealth (HOSPITAL_COMMUNITY): Payer: Self-pay

## 2021-08-05 NOTE — Telephone Encounter (Signed)
TOC OUTPATIENT PHARMACY FOLLOW-UP ?Call #1 attempted for Stacie Greene pharmacy follow-up.  Was unable to leave VM. Will follow up in 2-3 days.  ? ?

## 2021-08-08 ENCOUNTER — Telehealth: Payer: Self-pay

## 2021-08-08 ENCOUNTER — Other Ambulatory Visit: Payer: Self-pay

## 2021-08-08 NOTE — Telephone Encounter (Signed)
Transitions of Care Pharmacy  ? ?Call attempted for a pharmacy transitions of care follow-up. HIPAA appropriate voicemail was left with call back information provided.  ? ?Call attempt #2. Will follow-up in 2-3 days.  ? ?Darcus Austin, PharmD ?Clinical Pharmacist ?[facility] ?08/08/2021 11:01 AM ? ?  ? ?

## 2021-08-09 ENCOUNTER — Other Ambulatory Visit (HOSPITAL_COMMUNITY): Payer: Self-pay

## 2021-08-09 ENCOUNTER — Telehealth (HOSPITAL_COMMUNITY): Payer: Self-pay

## 2021-08-09 NOTE — Telephone Encounter (Signed)
Transitions of Care Pharmacy  ? ?Call attempted for a pharmacy transitions of care follow-up. ? ?Call attempt #3. Final Attempt. ? ?Darcus Austin, PharmD ?Clinical Pharmacist ?[facility] ?08/09/2021 1:07 PM ? ?

## 2021-08-15 ENCOUNTER — Other Ambulatory Visit: Payer: Self-pay | Admitting: Cardiology

## 2021-08-25 NOTE — H&P (View-Only) (Signed)
[facility] [place] ? ?ASSESSMENT / PLAN: ?Stacie Greene is a 46 y.o. right handed female in need of permanent dialysis access. I reviewed options for dialysis in detail with the patient, including hemodialysis and peritoneal dialysis. I counseled the patient to ask their nephrologist about their candidacy for renal transplant. I counseled the patient that dialysis access requires surveillance and periodic maintenance. Plan to proceed with laparoscopic peritoneal dialysis catheter placement.  ? ?CHIEF COMPLAINT: Patient desires to transition to peritoneal dialysis ? ?HISTORY OF PRESENT ILLNESS: ?Stacie Greene is a 46 y.o. female clinic to discuss transition to peritoneal dialysis.  She has a left upper extremity radiocephalic arteriovenous fistula which is working well for her.  She would prefer to transition to peritoneal dialysis for variety of reasons.  She reports an abdominal surgical history of laparoscopic appendectomy and laparoscopic partial hysterectomy. ? ? ?Past Medical History:  ?Diagnosis Date  ? Acute on chronic kidney failure ([place]) 06/01/2019  ? Chronic kidney disease 07/12/2019  ? Cyst of ovary 01/24/2020  ? ESRD due to hypertension ([place])   ? Heart attack ([place]) 2021  ? Hematuria 01/24/2020  ? Herpes   ? Hypertension secondary to other renal disorders 01/24/2020  ? ? ?Past Surgical History:  ?Procedure Laterality Date  ? APPENDECTOMY    ? COLONOSCOPY WITH PROPOFOL N/A 10/18/2020  ? Procedure: COLONOSCOPY WITH PROPOFOL;  Surgeon: Jackquline Denmark, MD;  Location: [facility] ENDOSCOPY;  Service: Gastroenterology;  Laterality: N/A;  ? COLONOSCOPY WITH PROPOFOL N/A 10/19/2020  ? Procedure: COLONOSCOPY WITH PROPOFOL;  Surgeon: Mauri Pole, MD;  Location: [place] ENDOSCOPY;  Service: Endoscopy;  Laterality: N/A;  ? CORONARY STENT INTERVENTION N/A 03/29/2020  ? Procedure: CORONARY STENT INTERVENTION;  Surgeon: Burnell Blanks, MD;  Location: [place] CV LAB;  Service: Cardiovascular;   Laterality: N/A;  ? ESOPHAGOGASTRODUODENOSCOPY (EGD) WITH PROPOFOL N/A 10/18/2020  ? Procedure: ESOPHAGOGASTRODUODENOSCOPY (EGD) WITH PROPOFOL;  Surgeon: Jackquline Denmark, MD;  Location: [place];  Service: Gastroenterology;  Laterality: N/A;  ? HOT HEMOSTASIS N/A 10/18/2020  ? Procedure: HOT HEMOSTASIS (ARGON PLASMA COAGULATION/BICAP);  Surgeon: Jackquline Denmark, MD;  Location: [facility] ENDOSCOPY;  Service: Gastroenterology;  Laterality: N/A;  ? HYSTEROPLASTY    ? INTRAVASCULAR ULTRASOUND/IVUS N/A 03/29/2020  ? Procedure: Intravascular Ultrasound/IVUS;  Surgeon: Burnell Blanks, MD;  Location: [place] CV LAB;  Service: Cardiovascular;  Laterality: N/A;  ? LEFT HEART CATH AND CORONARY ANGIOGRAPHY N/A 03/29/2020  ? Procedure: LEFT HEART CATH AND CORONARY ANGIOGRAPHY;  Surgeon: Burnell Blanks, MD;  Location: [place] CV LAB;  Service: Cardiovascular;  Laterality: N/A;  ? THYROID SURGERY    ? ? ?History reviewed. No pertinent family history. ? ?Social History  ? ?Socioeconomic History  ? Marital status: Single  ?  Spouse name: Not on file  ? Number of children: 2  ? Years of education: Not on file  ? Highest education level: Not on file  ?Occupational History  ? Not on file  ?Tobacco Use  ? Smoking status: Former  ?  Types: Cigarettes  ?  Quit date: 03/29/2020  ?  Years since quitting: 1.4  ? Smokeless tobacco: Never  ?Vaping Use  ? Vaping Use: Never used  ?Substance and Sexual Activity  ? Alcohol use: Not Currently  ? Drug use: Not Currently  ?  Types: Marijuana  ? Sexual activity: Yes  ?  Birth control/protection: Condom  ?Other Topics Concern  ? Not on file  ?Social History Narrative  ? Not on file  ? ?  Social Determinants of Health  ? ?Financial Resource Strain: Not on file  ?Food Insecurity: Not on file  ?Transportation Needs: Not on file  ?Physical Activity: Not on file  ?Stress: Not on file  ?Social Connections: Not on file  ?Intimate Partner Violence: Not on file  ? ? ?No Known  Allergies ? ?Current Outpatient Medications  ?Medication Sig Dispense Refill  ? amLODipine (NORVASC) 5 MG tablet Take 1 tablet (5 mg total) by mouth daily. 30 tablet 3  ? aspirin EC 81 MG tablet Take 81 mg by mouth daily. Swallow whole.    ? atorvastatin (LIPITOR) 40 MG tablet Take 1 tablet (40 mg total) by mouth daily at 6 PM. Schedule an appointment with cardiology for further refills, 1st attempt 30 tablet 3  ? carvedilol (COREG) 25 MG tablet Take 1 tablet (25 mg total) by mouth 2 (two) times daily with a meal. 60 tablet 3  ? hydrALAZINE (APRESOLINE) 25 MG tablet Take 1 tablet (25 mg total) by mouth every 8 (eight) hours. 90 tablet 3  ? losartan (COZAAR) 50 MG tablet Take 1 tablet (50 mg total) by mouth daily. 30 tablet 3  ? sevelamer carbonate (RENVELA) 800 MG tablet Take 2 tablets (1,600 mg total) by mouth 3 (three) times daily with meals. 180 tablet 3  ? ticagrelor (BRILINTA) 90 MG TABS tablet Take 1 tablet (90 mg total) by mouth 2 (two) times daily. 180 tablet 3  ? ?No current facility-administered medications for this visit.  ? ? ?PHYSICAL EXAM ?Vitals:  ? 08/26/21 1311  ?BP: (!) 146/88  ?Pulse: 87  ?Resp: 20  ?Temp: 98.8 ?F (37.1 ?C)  ?SpO2: 96%  ?Weight: 153 lb (69.4 kg)  ?Height: 5\' 6"  (1.676 m)  ? ? ?Constitutional: Well-appearing young woman in no acute distress. ?Cardiac: regular rate and rhythm.  ?Respiratory:  unlabored. ?Abdominal:  soft, non-tender, non-distended.  ?Peripheral vascular: Left radiocephalic arteriovenous fistula with good thrill ? ?PERTINENT LABORATORY AND RADIOLOGIC DATA ? ?Most recent CBC ? ?  Latest Ref Rng & Units 07/29/2021  ?  2:19 AM 07/28/2021  ?  4:43 AM 07/27/2021  ?  8:31 AM  ?CBC  ?WBC 4.0 - 10.5 K/uL 12.6   10.7   11.6    ?Hemoglobin 12.0 - 15.0 g/dL 7.3   7.4   8.0    ?Hematocrit 36.0 - 46.0 % 23.1   23.9   25.0    ?Platelets 150 - 400 K/uL 221   208   213    ?  ? ?Most recent CMP ? ?  Latest Ref Rng & Units 07/29/2021  ? 11:33 AM 07/29/2021  ?  2:19 AM 07/28/2021  ?  4:43 AM   ?CMP  ?Glucose 70 - 99 mg/dL 138   107   112    ?BUN 6 - 20 mg/dL 45   105   100    ?Creatinine 0.44 - 1.00 mg/dL 17.39   31.39   30.38    ?Sodium 135 - 145 mmol/L 134   134   135    ?Potassium 3.5 - 5.1 mmol/L 3.8   3.6   3.9    ?Chloride 98 - 111 mmol/L 97   91   95    ?CO2 22 - 32 mmol/L 26   25   22     ?Calcium 8.9 - 10.3 mg/dL 7.7   7.2   7.4    ? ? ?Renal function ?CrCl cannot be calculated (Patient's most recent lab result is older  than the maximum 21 days allowed.). ? ?Hgb A1c MFr Bld (%)  ?Date Value  ?07/25/2021 6.2 (H)  ? ? ?LDL Cholesterol  ?Date Value Ref Range Status  ?03/28/2020 144 (H) 0 - 99 mg/dL Final  ?  Comment:  ?         ?Total Cholesterol/HDL:CHD Risk ?Coronary Heart Disease Risk Table ?                    Men   Women ? 1/2 Average Risk   3.4   3.3 ? Average Risk       5.0   4.4 ? 2 X Average Risk   9.6   7.1 ? 3 X Average Risk  23.4   11.0 ?       ?Use the calculated Patient Ratio ?above and the CHD Risk Table ?to determine the patient's CHD Risk. ?       ?ATP III CLASSIFICATION (LDL): ? <100     mg/dL   Optimal ? 100-129  mg/dL   Near or Above ?                   Optimal ? 130-159  mg/dL   Borderline ? 160-189  mg/dL   High ? >190     mg/dL   Very High ?Performed at [facility], [place] [address], [place], [place] [address] ?  ?  ? ?Yevonne Aline. Stanford Breed, MD ?[facility] [place] ?Office Phone Number: [phone] ?08/26/2021 4:12 PM ? ?Total time spent on preparing this encounter including chart review, data review, collecting history, examining the patient, coordinating care for this new patient, 60 minutes. ? ?Portions of this report may have been transcribed using voice recognition software.  Every effort has been made to ensure accuracy; however, inadvertent computerized transcription errors may still be present. ? ? ? ?

## 2021-08-25 NOTE — Progress Notes (Signed)
[facility] [place] ? ?ASSESSMENT / PLAN: ?Stacie Greene is a 46 y.o. right handed female in need of permanent dialysis access. I reviewed options for dialysis in detail with the patient, including hemodialysis and peritoneal dialysis. I counseled the patient to ask their nephrologist about their candidacy for renal transplant. I counseled the patient that dialysis access requires surveillance and periodic maintenance. Plan to proceed with laparoscopic peritoneal dialysis catheter placement.  ? ?CHIEF COMPLAINT: Patient desires to transition to peritoneal dialysis ? ?HISTORY OF PRESENT ILLNESS: ?Stacie Greene is a 46 y.o. female clinic to discuss transition to peritoneal dialysis.  She has a left upper extremity radiocephalic arteriovenous fistula which is working well for her.  She would prefer to transition to peritoneal dialysis for variety of reasons.  She reports an abdominal surgical history of laparoscopic appendectomy and laparoscopic partial hysterectomy. ? ? ?Past Medical History:  ?Diagnosis Date  ? Acute on chronic kidney failure ([place]) 06/01/2019  ? Chronic kidney disease 07/12/2019  ? Cyst of ovary 01/24/2020  ? 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 Heart attack ([place]) 2021  ? Hematuria 01/24/2020  ? Herpes   ? Hypertension secondary to other renal disorders 01/24/2020  ? ? ?Past Surgical History:  ?Procedure Laterality Date  ? APPENDECTOMY    ? COLONOSCOPY WITH PROPOFOL N/A 10/18/2020  ? Procedure: COLONOSCOPY WITH PROPOFOL;  Surgeon: Jackquline Denmark, MD;  Location[facility] ENDOSCOPY;  Service: Gastroenterology;  Laterality: N/A;  ? COLONOSCOPY WITH PROPOFOL N/A 10/19/2020  ? Procedure: COLONOSCOPY WITH PROPOFOL;  Surgeon: Mauri Pole, MD;  Location: [place] ENDOSCOPY;  Service: Endoscopy;  Laterality: N/A;  ? CORONARY STENT INTERVENTION N/A 03/29/2020  ? Procedure: CORONARY STENT INTERVENTION;  Surgeon: Burnell Blanks, MD;  Location: [place] CV LAB;  Service: Cardiovascular;   Laterality: N/A;  ? ESOPHAGOGASTRODUODENOSCOPY (EGD) WITH PROPOFOL N/A 10/18/2020  ? Procedure: ESOPHAGOGASTRODUODENOSCOPY (EGD) WITH PROPOFOL;  Surgeon: Jackquline Denmark, MD;  Location: [place];  Service: Gastroenterology;  Laterality: N/A;  ? HOT HEMOSTASIS N/A 10/18/2020  ? Procedure: HOT HEMOSTASIS (ARGON PLASMA COAGULATION/BICAP);  Surgeon: Jackquline Denmark, MD;  Location: [facility] ENDOSCOPY;  Service: Gastroenterology;  Laterality: N/A;  ? HYSTEROPLASTY    ? INTRAVASCULAR ULTRASOUND/IVUS N/A 03/29/2020  ? Procedure: Intravascular Ultrasound/IVUS;  Surgeon: Burnell Blanks, MD;  Location: [place] CV LAB;  Service: Cardiovascular;  Laterality: N/A;  ? LEFT HEART CATH AND CORONARY ANGIOGRAPHY N/A 03/29/2020  ? Procedure: LEFT HEART CATH AND CORONARY ANGIOGRAPHY;  Surgeon: Burnell Blanks, MD;  Location: [place] CV LAB;  Service: Cardiovascular;  Laterality: N/A;  ? THYROID SURGERY    ? ? ?History reviewed. No pertinent family history. ? ?Social History  ? ?Socioeconomic History  ? Marital status: Single  ?  Spouse name: Not on file  ? Number of children: 2  ? Years of education: Not on file  ? Highest education level: Not on file  ?Occupational History  ? Not on file  ?Tobacco Use  ? Smoking status: Former  ?  Types: Cigarettes  ?  Quit date: 03/29/2020  ?  Years since quitting: 1.4  ? Smokeless tobacco: Never  ?Vaping Use  ? Vaping Use: Never used  ?Substance and Sexual Activity  ? Alcohol use: Not Currently  ? Drug use: Not Currently  ?  Types: Marijuana  ? Sexual activity: Yes  ?  Birth control/protection: Condom  ?Other Topics Concern  ? Not on file  ?Social History Narrative  ? Not on file  ? ?  Social Determinants of Health  ? ?Financial Resource Strain: Not on file  ?Food Insecurity: Not on file  ?Transportation Needs: Not on file  ?Physical Activity: Not on file  ?Stress: Not on file  ?Social Connections: Not on file  ?Intimate Partner Violence: Not on file  ? ? ?No Known  Allergies ? ?Current Outpatient Medications  ?Medication Sig Dispense Refill  ? amLODipine (NORVASC) 5 MG tablet Take 1 tablet (5 mg total) by mouth daily. 30 tablet 3  ? aspirin EC 81 MG tablet Take 81 mg by mouth daily. Swallow whole.    ? atorvastatin (LIPITOR) 40 MG tablet Take 1 tablet (40 mg total) by mouth daily at 6 PM. Schedule an appointment with cardiology for further refills, 1st attempt 30 tablet 3  ? carvedilol (COREG) 25 MG tablet Take 1 tablet (25 mg total) by mouth 2 (two) times daily with a meal. 60 tablet 3  ? hydrALAZINE (APRESOLINE) 25 MG tablet Take 1 tablet (25 mg total) by mouth every 8 (eight) hours. 90 tablet 3  ? losartan (COZAAR) 50 MG tablet Take 1 tablet (50 mg total) by mouth daily. 30 tablet 3  ? sevelamer carbonate (RENVELA) 800 MG tablet Take 2 tablets (1,600 mg total) by mouth 3 (three) times daily with meals. 180 tablet 3  ? ticagrelor (BRILINTA) 90 MG TABS tablet Take 1 tablet (90 mg total) by mouth 2 (two) times daily. 180 tablet 3  ? ?No current facility-administered medications for this visit.  ? ? ?PHYSICAL EXAM ?Vitals:  ? 08/26/21 1311  ?BP: (!) 146/88  ?Pulse: 87  ?Resp: 20  ?Temp: 98.8 ?F (37.1 ?C)  ?SpO2: 96%  ?Weight: 153 lb (69.4 kg)  ?Height: 5\' 6"  (1.676 m)  ? ? ?Constitutional: Well-appearing young woman in no acute distress. ?Cardiac: regular rate and rhythm.  ?Respiratory:  unlabored. ?Abdominal:  soft, non-tender, non-distended.  ?Peripheral vascular: Left radiocephalic arteriovenous fistula with good thrill ? ?PERTINENT LABORATORY AND RADIOLOGIC DATA ? ?Most recent CBC ? ?  Latest Ref Rng & Units 07/29/2021  ?  2:19 AM 07/28/2021  ?  4:43 AM 07/27/2021  ?  8:31 AM  ?CBC  ?WBC 4.0 - 10.5 K/uL 12.6   10.7   11.6    ?Hemoglobin 12.0 - 15.0 g/dL 7.3   7.4   8.0    ?Hematocrit 36.0 - 46.0 % 23.1   23.9   25.0    ?Platelets 150 - 400 K/uL 221   208   213    ?  ? ?Most recent CMP ? ?  Latest Ref Rng & Units 07/29/2021  ? 11:33 AM 07/29/2021  ?  2:19 AM 07/28/2021  ?  4:43 AM   ?CMP  ?Glucose 70 - 99 mg/dL 138   107   112    ?BUN 6 - 20 mg/dL 45   105   100    ?Creatinine 0.44 - 1.00 mg/dL 17.39   31.39   30.38    ?Sodium 135 - 145 mmol/L 134   134   135    ?Potassium 3.5 - 5.1 mmol/L 3.8   3.6   3.9    ?Chloride 98 - 111 mmol/L 97   91   95    ?CO2 22 - 32 mmol/L 26   25   22     ?Calcium 8.9 - 10.3 mg/dL 7.7   7.2   7.4    ? ? ?Renal function ?CrCl cannot be calculated (Patient's most recent lab result is older  than the maximum 21 days allowed.). ? ?Hgb A1c MFr Bld (%)  ?Date Value  ?07/25/2021 6.2 (H)  ? ? ?LDL Cholesterol  ?Date Value Ref Range Status  ?03/28/2020 144 (H) 0 - 99 mg/dL Final  ?  Comment:  ?         ?Total Cholesterol/HDL:CHD Risk ?Coronary Heart Disease Risk Table ?                    Men   Women ? 1/2 Average Risk   3.4   3.3 ? Average Risk       5.0   4.4 ? 2 X Average Risk   9.6   7.1 ? 3 X Average Risk  23.4   11.0 ?       ?Use the calculated Patient Ratio ?above and the CHD Risk Table ?to determine the patient's CHD Risk. ?       ?ATP III CLASSIFICATION (LDL): ? <100     mg/dL   Optimal ? 100-129  mg/dL   Near or Above ?                   Optimal ? 130-159  mg/dL   Borderline ? 160-189  mg/dL   High ? >190     mg/dL   Very High ?Performed at [facility], [place] [address] [place], [place] [address] ?  ?  ? ?Yevonne Aline. Stanford Breed, MD ?[facility] [place] ?Office Phone Number: [phone] ?08/26/2021 4:12 PM ? ?Total time spent on preparing this encounter including chart review, data review, collecting history, examining the patient, coordinating care for this new patient, 60 minutes. ? ?Portions of this report may have been transcribed using voice recognition software.  Every effort has been made to ensure accuracy; however, inadvertent computerized transcription errors may still be present. ? ? ? ?

## 2021-08-26 ENCOUNTER — Ambulatory Visit (INDEPENDENT_AMBULATORY_CARE_PROVIDER_SITE_OTHER): Payer: POS | Admitting: Vascular Surgery

## 2021-08-26 ENCOUNTER — Other Ambulatory Visit: Payer: Self-pay

## 2021-08-26 ENCOUNTER — Encounter: Payer: Self-pay | Admitting: Vascular Surgery

## 2021-08-26 VITALS — BP 146/88 | HR 87 | Temp 98.8°F | Resp 20 | Ht 66.0 in | Wt 153.0 lb

## 2021-08-26 DIAGNOSIS — Z992 Dependence on renal dialysis: Secondary | ICD-10-CM | POA: Diagnosis not present

## 2021-08-26 DIAGNOSIS — N186 End stage renal disease: Secondary | ICD-10-CM | POA: Diagnosis not present

## 2021-08-27 NOTE — Addendum Note (Signed)
Addended by: Nicholas Lose on: 08/27/2021 12:36 PM ? ? Modules accepted: Orders ? ?

## 2021-09-02 ENCOUNTER — Other Ambulatory Visit: Payer: Self-pay

## 2021-09-02 ENCOUNTER — Encounter (HOSPITAL_COMMUNITY): Payer: Self-pay | Admitting: Vascular Surgery

## 2021-09-02 NOTE — Progress Notes (Signed)
Ms Stacie Greene denies chest pain or shortness of breath. Patient denies having any s/s of Covid in her household.  Patient denies any known exposure to Covid. ?Ms Stacie Greene's PCP is [name], NP. ?Cardiologist is Dr. Einar Gip . ? ?I instructed   Ms Stacie Greene to wash up well with with antibacteria soap. No nail polish, artificial or acrylic nails. Wear clean clothes, brush your teeth. ?Glasses, contact lens,dentures or partials may not be worn in the OR. If you need to wear them, please bring a case for glasses, do not wear contacts or bring a case, the hospital does not have contact cases, dentures or partials will have to be removed , make sure they are clean, we will provide a denture cup to put them in. You will need some one to drive you home and a responsible person over the age of 45 to stay with you for the first 24 hours after surgery.  ?

## 2021-09-03 ENCOUNTER — Other Ambulatory Visit: Payer: Self-pay

## 2021-09-03 ENCOUNTER — Encounter (HOSPITAL_COMMUNITY)
Admission: RE | Disposition: A | Discharge status: Home / Self Care | Payer: Self-pay | Source: Home / Self Care | Attending: Vascular Surgery

## 2021-09-03 ENCOUNTER — Ambulatory Visit (HOSPITAL_COMMUNITY): Payer: POS | Admitting: Anesthesiology

## 2021-09-03 ENCOUNTER — Ambulatory Visit (HOSPITAL_BASED_OUTPATIENT_CLINIC_OR_DEPARTMENT_OTHER): Payer: POS | Admitting: Anesthesiology

## 2021-09-03 ENCOUNTER — Encounter (HOSPITAL_COMMUNITY): Payer: Self-pay | Admitting: Vascular Surgery

## 2021-09-03 ENCOUNTER — Ambulatory Visit (HOSPITAL_COMMUNITY)
Admission: RE | Admit: 2021-09-03 | Discharge: 2021-09-03 | Disposition: A | Discharge status: Home / Self Care | Payer: POS | Attending: Vascular Surgery | Admitting: Vascular Surgery

## 2021-09-03 ENCOUNTER — Ambulatory Visit: Payer: POS | Admitting: Family

## 2021-09-03 DIAGNOSIS — Z87891 Personal history of nicotine dependence: Secondary | ICD-10-CM | POA: Insufficient documentation

## 2021-09-03 DIAGNOSIS — I252 Old myocardial infarction: Secondary | ICD-10-CM

## 2021-09-03 DIAGNOSIS — I509 Heart failure, unspecified: Secondary | ICD-10-CM

## 2021-09-03 DIAGNOSIS — I120 Hypertensive chronic kidney disease with stage 5 chronic kidney disease or end stage renal disease: Secondary | ICD-10-CM | POA: Insufficient documentation

## 2021-09-03 DIAGNOSIS — Z992 Dependence on renal dialysis: Secondary | ICD-10-CM | POA: Insufficient documentation

## 2021-09-03 DIAGNOSIS — I132 Hypertensive heart and chronic kidney disease with heart failure and with stage 5 chronic kidney disease, or end stage renal disease: Secondary | ICD-10-CM

## 2021-09-03 DIAGNOSIS — I2510 Atherosclerotic heart disease of native coronary artery without angina pectoris: Secondary | ICD-10-CM

## 2021-09-03 DIAGNOSIS — N185 Chronic kidney disease, stage 5: Secondary | ICD-10-CM | POA: Diagnosis not present

## 2021-09-03 DIAGNOSIS — N186 End stage renal disease: Secondary | ICD-10-CM | POA: Insufficient documentation

## 2021-09-03 HISTORY — PX: CAPD INSERTION: SHX5233

## 2021-09-03 HISTORY — DX: Atherosclerotic heart disease of native coronary artery without angina pectoris: I25.10

## 2021-09-03 LAB — POCT I-STAT, CHEM 8
BUN: 9 mg/dL (ref 6–20)
Calcium, Ion: 1.05 mmol/L — ABNORMAL LOW (ref 1.15–1.40)
Chloride: 96 mmol/L — ABNORMAL LOW (ref 98–111)
Creatinine, Ser: 7.8 mg/dL — ABNORMAL HIGH (ref 0.44–1.00)
Glucose, Bld: 89 mg/dL (ref 70–99)
HCT: 31 % — ABNORMAL LOW (ref 36.0–46.0)
Hemoglobin: 10.5 g/dL — ABNORMAL LOW (ref 12.0–15.0)
Potassium: 3.5 mmol/L (ref 3.5–5.1)
Sodium: 139 mmol/L (ref 135–145)
TCO2: 31 mmol/L (ref 22–32)

## 2021-09-03 SURGERY — LAPAROSCOPIC INSERTION CONTINUOUS AMBULATORY PERITONEAL DIALYSIS  (CAPD) CATHETER
Anesthesia: General | Site: Abdomen

## 2021-09-03 MED ORDER — MIDAZOLAM HCL 2 MG/2ML IJ SOLN
INTRAMUSCULAR | Status: DC | PRN
  Administered 2021-09-03: 2 mg via INTRAVENOUS

## 2021-09-03 MED ORDER — SUGAMMADEX SODIUM 200 MG/2ML IV SOLN
INTRAVENOUS | Status: DC | PRN
  Administered 2021-09-03: 200 mg via INTRAVENOUS

## 2021-09-03 MED ORDER — CEFAZOLIN SODIUM-DEXTROSE 2-4 GM/100ML-% IV SOLN
2.0000 g | INTRAVENOUS | Status: AC
Start: 2021-09-03 — End: 2021-09-03
  Administered 2021-09-03: 2 g via INTRAVENOUS

## 2021-09-03 MED ORDER — FENTANYL CITRATE (PF) 100 MCG/2ML IJ SOLN: 25.0000 ug | INTRAMUSCULAR | Status: DC | PRN

## 2021-09-03 MED ORDER — LIDOCAINE 2% (20 MG/ML) 5 ML SYRINGE
INTRAMUSCULAR | Status: DC | PRN
  Administered 2021-09-03: 60 mg via INTRAVENOUS

## 2021-09-03 MED ORDER — ORAL CARE MOUTH RINSE: 15.0000 mL | Freq: Once | OROMUCOSAL | Status: AC

## 2021-09-03 MED ORDER — ROCURONIUM BROMIDE 10 MG/ML (PF) SYRINGE
PREFILLED_SYRINGE | INTRAVENOUS | Status: AC
  Filled 2021-09-03: qty 10

## 2021-09-03 MED ORDER — OXYCODONE-ACETAMINOPHEN 5-325 MG PO TABS: 1.0000 | ORAL_TABLET | Freq: Four times a day (QID) | ORAL | 0 refills | Status: DC | PRN

## 2021-09-03 MED ORDER — PROPOFOL 10 MG/ML IV BOLUS
INTRAVENOUS | Status: AC
  Filled 2021-09-03: qty 20

## 2021-09-03 MED ORDER — ONDANSETRON HCL 4 MG/2ML IJ SOLN: 4.0000 mg | Freq: Once | INTRAMUSCULAR | Status: DC | PRN

## 2021-09-03 MED ORDER — CHLORHEXIDINE GLUCONATE 4 % EX LIQD: 60.0000 mL | Freq: Once | CUTANEOUS | Status: DC

## 2021-09-03 MED ORDER — SODIUM CHLORIDE 0.9 % IV SOLN: INTRAVENOUS | Status: DC

## 2021-09-03 MED ORDER — DEXAMETHASONE SODIUM PHOSPHATE 10 MG/ML IJ SOLN
INTRAMUSCULAR | Status: DC | PRN
  Administered 2021-09-03: 4 mg via INTRAVENOUS

## 2021-09-03 MED ORDER — ACETAMINOPHEN 500 MG PO TABS
1000.0000 mg | ORAL_TABLET | Freq: Once | ORAL | Status: AC
  Administered 2021-09-03: 1000 mg via ORAL
  Filled 2021-09-03: qty 2

## 2021-09-03 MED ORDER — ROCURONIUM BROMIDE 10 MG/ML (PF) SYRINGE
PREFILLED_SYRINGE | INTRAVENOUS | Status: DC | PRN
  Administered 2021-09-03: 50 mg via INTRAVENOUS

## 2021-09-03 MED ORDER — PROPOFOL 10 MG/ML IV BOLUS
INTRAVENOUS | Status: DC | PRN
  Administered 2021-09-03: 100 mg via INTRAVENOUS

## 2021-09-03 MED ORDER — PHENYLEPHRINE HCL-NACL 20-0.9 MG/250ML-% IV SOLN
INTRAVENOUS | Status: DC | PRN
  Administered 2021-09-03: 50 ug/min via INTRAVENOUS

## 2021-09-03 MED ORDER — SODIUM CHLORIDE 0.9 % IV SOLN
INTRAVENOUS | Status: DC
Start: 2021-09-03 — End: 2021-09-03

## 2021-09-03 MED ORDER — CEFAZOLIN SODIUM-DEXTROSE 2-4 GM/100ML-% IV SOLN
INTRAVENOUS | Status: AC
  Filled 2021-09-03: qty 100

## 2021-09-03 MED ORDER — MIDAZOLAM HCL 2 MG/2ML IJ SOLN
INTRAMUSCULAR | Status: AC
  Filled 2021-09-03: qty 2

## 2021-09-03 MED ORDER — OXYCODONE HCL 5 MG/5ML PO SOLN: 5.0000 mg | Freq: Once | ORAL | Status: DC | PRN

## 2021-09-03 MED ORDER — SODIUM CHLORIDE 0.9 % IR SOLN
Status: DC | PRN
  Administered 2021-09-03: 1

## 2021-09-03 MED ORDER — ONDANSETRON HCL 4 MG/2ML IJ SOLN
INTRAMUSCULAR | Status: DC | PRN
  Administered 2021-09-03: 4 mg via INTRAVENOUS

## 2021-09-03 MED ORDER — OXYCODONE HCL 5 MG PO TABS: 5.0000 mg | ORAL_TABLET | Freq: Once | ORAL | Status: DC | PRN

## 2021-09-03 MED ORDER — CHLORHEXIDINE GLUCONATE 0.12 % MT SOLN: 15.0000 mL | Freq: Once | OROMUCOSAL | Status: AC

## 2021-09-03 MED ORDER — LIDOCAINE 2% (20 MG/ML) 5 ML SYRINGE
INTRAMUSCULAR | Status: AC
  Filled 2021-09-03: qty 5

## 2021-09-03 MED ORDER — DEXAMETHASONE SODIUM PHOSPHATE 10 MG/ML IJ SOLN
INTRAMUSCULAR | Status: AC
  Filled 2021-09-03: qty 1

## 2021-09-03 MED ORDER — CHLORHEXIDINE GLUCONATE 0.12 % MT SOLN
OROMUCOSAL | Status: AC
  Administered 2021-09-03: 15 mL via OROMUCOSAL
  Filled 2021-09-03: qty 15

## 2021-09-03 MED ORDER — FENTANYL CITRATE (PF) 250 MCG/5ML IJ SOLN
INTRAMUSCULAR | Status: DC | PRN
Start: 2021-09-03 — End: 2021-09-03
  Administered 2021-09-03 (×3): 50 ug via INTRAVENOUS
  Administered 2021-09-03: 100 ug via INTRAVENOUS

## 2021-09-03 MED ORDER — ONDANSETRON HCL 4 MG/2ML IJ SOLN
INTRAMUSCULAR | Status: AC
  Filled 2021-09-03: qty 2

## 2021-09-03 MED ORDER — FENTANYL CITRATE (PF) 250 MCG/5ML IJ SOLN
INTRAMUSCULAR | Status: AC
  Filled 2021-09-03: qty 5

## 2021-09-03 SURGICAL SUPPLY — 47 items
ADAPTER TITANIUM MEDIONICS (MISCELLANEOUS) ×2 IMPLANT
BAG DECANTER FOR FLEXI CONT (MISCELLANEOUS) ×2 IMPLANT
BIOPATCH RED 1 DISK 7.0 (GAUZE/BANDAGES/DRESSINGS) ×2 IMPLANT
BLADE 11 SAFETY STRL DISP (BLADE) ×2 IMPLANT
BLADE CLIPPER SURG (BLADE) IMPLANT
CATH EXTENDED DIALYSIS (CATHETERS) ×1 IMPLANT
CHLORAPREP W/TINT 26 (MISCELLANEOUS) ×2 IMPLANT
COVER SURGICAL LIGHT HANDLE (MISCELLANEOUS) ×2 IMPLANT
DECANTER SPIKE VIAL GLASS SM (MISCELLANEOUS) ×2 IMPLANT
DERMABOND ADVANCED (GAUZE/BANDAGES/DRESSINGS) ×1
DERMABOND ADVANCED .7 DNX12 (GAUZE/BANDAGES/DRESSINGS) ×1 IMPLANT
DRAPE LAPAROSCOPIC ABDOMINAL (DRAPES) ×1 IMPLANT
ELECT REM PT RETURN 9FT ADLT (ELECTROSURGICAL) ×2
ELECTRODE REM PT RTRN 9FT ADLT (ELECTROSURGICAL) ×1 IMPLANT
GAUZE SPONGE 4X4 12PLY STRL (GAUZE/BANDAGES/DRESSINGS) ×2 IMPLANT
GLOVE INDICATOR 6.5 STRL GRN (GLOVE) ×2 IMPLANT
GLOVE SURG SS PI 8.0 STRL IVOR (GLOVE) ×2 IMPLANT
GOWN STRL REUS W/ TWL LRG LVL3 (GOWN DISPOSABLE) ×3 IMPLANT
GOWN STRL REUS W/ TWL XL LVL3 (GOWN DISPOSABLE) ×1 IMPLANT
GOWN STRL REUS W/TWL LRG LVL3 (GOWN DISPOSABLE) ×3
GOWN STRL REUS W/TWL XL LVL3 (GOWN DISPOSABLE) ×1
GRASPER SUT TROCAR 14GX15 (MISCELLANEOUS) ×2 IMPLANT
IV NS 1000ML (IV SOLUTION) ×1
IV NS 1000ML BAXH (IV SOLUTION) ×1 IMPLANT
KIT BASIN OR (CUSTOM PROCEDURE TRAY) ×2 IMPLANT
KIT TURNOVER KIT B (KITS) ×2 IMPLANT
NDL INSUFFLATION 14GA 120MM (NEEDLE) ×1 IMPLANT
NEEDLE INSUFFLATION 14GA 120MM (NEEDLE) ×2 IMPLANT
NS IRRIG 1000ML POUR BTL (IV SOLUTION) ×2 IMPLANT
PAD ARMBOARD 7.5X6 YLW CONV (MISCELLANEOUS) ×4 IMPLANT
SCISSORS LAP 5X35 DISP (ENDOMECHANICALS) IMPLANT
SET CYSTO W/LG BORE CLAMP LF (SET/KITS/TRAYS/PACK) ×2 IMPLANT
SET TUBE SMOKE EVAC HIGH FLOW (TUBING) ×2 IMPLANT
SLEEVE ENDOPATH XCEL 5M (ENDOMECHANICALS) ×2 IMPLANT
STYLET FALLER (MISCELLANEOUS) ×3 IMPLANT
SUT MNCRL AB 4-0 PS2 18 (SUTURE) ×3 IMPLANT
SUT PROLENE 0 SH 30 (SUTURE) ×4 IMPLANT
SUT VIC AB 3-0 SH 27 (SUTURE) ×1
SUT VIC AB 3-0 SH 27X BRD (SUTURE) IMPLANT
SYR 20CC LL (SYRINGE) ×2 IMPLANT
TAPE CLOTH SURG 4X10 WHT LF (GAUZE/BANDAGES/DRESSINGS) ×2 IMPLANT
TOWEL GREEN STERILE (TOWEL DISPOSABLE) ×2 IMPLANT
TOWEL GREEN STERILE FF (TOWEL DISPOSABLE) ×2 IMPLANT
TRAY LAPAROSCOPIC MC (CUSTOM PROCEDURE TRAY) ×2 IMPLANT
TROCAR 5MMX150MM (TROCAR) ×2 IMPLANT
TROCAR XCEL NON-BLD 5MMX100MML (ENDOMECHANICALS) ×2 IMPLANT
WATER STERILE IRR 1000ML POUR (IV SOLUTION) ×2 IMPLANT

## 2021-09-03 NOTE — Anesthesia Procedure Notes (Signed)
Procedure Name: Intubation ?Date/Time: 09/03/2021 11:29 AM ?Performed by: Cathren Harsh, CRNA ?Pre-anesthesia Checklist: Patient identified, Emergency Drugs available, Suction available and Patient being monitored ?Patient Re-evaluated:Patient Re-evaluated prior to induction ?Oxygen Delivery Method: Circle System Utilized ?Preoxygenation: Pre-oxygenation with 100% oxygen ?Induction Type: IV induction ?Ventilation: Mask ventilation without difficulty ?Laryngoscope Size: Mac and 3 ?Grade View: Grade I ?Tube type: Oral ?Tube size: 7.0 mm ?Number of attempts: 1 ?Airway Equipment and Method: Stylet and Oral airway ?Placement Confirmation: ETT inserted through vocal cords under direct vision, positive ETCO2 and breath sounds checked- equal and bilateral ?Secured at: 22 cm ?Tube secured with: Tape ?Dental Injury: Teeth and Oropharynx as per pre-operative assessment  ?Comments: Pt pre-existing dental damage unchanged following airway instrumentation. ? ? ? ? ?

## 2021-09-03 NOTE — Anesthesia Postprocedure Evaluation (Signed)
Anesthesia Post Note ? ?Patient: Tayloranne Lekas ? ?Procedure(s) Performed: LAPAROSCOPIC INSERTION CONTINUOUS AMBULATORY PERITONEAL DIALYSIS  (CAPD) CATHETER (Abdomen) ?LAPAROSCOPIC OMENTOPEXY/ (Abdomen) ? ?  ? ?Patient location during evaluation: PACU ?Anesthesia Type: General ?Level of consciousness: awake and alert ?Pain management: pain level controlled ?Vital Signs Assessment: post-procedure vital signs reviewed and stable ?Respiratory status: spontaneous breathing, nonlabored ventilation and respiratory function stable ?Cardiovascular status: stable and blood pressure returned to baseline ?Anesthetic complications: no ? ? ?No notable events documented. ? ?Last Vitals:  ?Vitals:  ? 09/03/21 1310 09/03/21 1318  ?BP: (!) 150/88 (!) 159/91  ?Pulse: 81 78  ?Resp: 20 16  ?Temp:    ?SpO2: 98% 96%  ?  ?Last Pain:  ?Vitals:  ? 09/03/21 1318  ?TempSrc:   ?PainSc: 0-No pain  ? ? ?  ?  ?  ?  ?  ?  ? ?Audry Pili ? ? ? ? ?

## 2021-09-03 NOTE — Anesthesia Preprocedure Evaluation (Addendum)
Anesthesia Evaluation  ?Patient identified by MRN, date of birth, ID band ?Patient awake ? ? ? ?Reviewed: ?Allergy & Precautions, NPO status , Patient's Chart, lab work & pertinent test results, reviewed documented beta blocker date and time  ? ?History of Anesthesia Complications ?Negative for: history of anesthetic complications ? ?Airway ?Mallampati: II ? ?TM Distance: >3 FB ?Neck ROM: Full ? ? ? Dental ? ?(+) Dental Advisory Given, Chipped ?  ?Pulmonary ?former smoker,  ?  ?Pulmonary exam normal ? ? ? ? ? ? ? Cardiovascular ?hypertension, Pt. on medications and Pt. on home beta blockers ?+ CAD, + Past MI, + Cardiac Stents and +CHF  ?Normal cardiovascular exam ? ? ?'23 TTE - EF 60 to 65%. Moderate left ventricular hypertrophy. Grade I diastolic dysfunction (impaired  ?relaxation). Trivial mitral valve regurgitation. ? ?'21 Cath - Mid Cx to Dist Cx lesion is 20% stenosed. ?Prox LAD lesion is 99% stenosed. ?Mid LAD lesion is 70% stenosed. ?Dist LAD lesion is 40% stenosed. ?A drug-eluting stent was successfully placed using a SYNERGY XD 3.0X20. ?Post intervention, there is a 0% residual stenosis. ?A drug-eluting stent was successfully placed using a SYNERGY XD 3.0X20. ?Post intervention, there is a 0% residual stenosis. ? ?  ?Neuro/Psych ?negative neurological ROS ? negative psych ROS  ? GI/Hepatic ?negative GI ROS, Neg liver ROS,   ?Endo/Other  ?negative endocrine ROS ? Renal/GU ?ESRF and DialysisRenal disease  ? ?  ?Musculoskeletal ?negative musculoskeletal ROS ?(+)  ? Abdominal ?  ?Peds ? Hematology ? ?On brilinta ?   ?Anesthesia Other Findings ?HSV ? Reproductive/Obstetrics ? ?  ? ? ? ? ? ? ? ? ? ? ? ? ? ?  ?  ? ? ? ? ? ? ? ?Anesthesia Physical ?Anesthesia Plan ? ?ASA: 3 ? ?Anesthesia Plan: General  ? ?Post-op Pain Management: Tylenol PO (pre-op)*  ? ?Induction: Intravenous ? ?PONV Risk Score and Plan: 3 and Treatment may vary due to age or medical condition, Ondansetron,  Dexamethasone and Midazolam ? ?Airway Management Planned: Oral ETT ? ?Additional Equipment: None ? ?Intra-op Plan:  ? ?Post-operative Plan: Extubation in OR ? ?Informed Consent: I have reviewed the patients History and Physical, chart, labs and discussed the procedure including the risks, benefits and alternatives for the proposed anesthesia with the patient or authorized representative who has indicated his/her understanding and acceptance.  ? ? ? ?Dental advisory given ? ?Plan Discussed with: CRNA and Anesthesiologist ? ?Anesthesia Plan Comments:   ? ? ? ? ? ?Anesthesia Quick Evaluation ? ?

## 2021-09-03 NOTE — Transfer of Care (Signed)
Immediate Anesthesia Transfer of Care Note ? ?Patient: Stacie Greene ? ?Procedure(s) Performed: LAPAROSCOPIC INSERTION CONTINUOUS AMBULATORY PERITONEAL DIALYSIS  (CAPD) CATHETER (Abdomen) ?LAPAROSCOPIC OMENTOPEXY/ (Abdomen) ? ?Patient Location: PACU ? ?Anesthesia Type:General ? ?Level of Consciousness: drowsy, patient cooperative and responds to stimulation ? ?Airway & Oxygen Therapy: Patient Spontanous Breathing and Patient connected to nasal cannula oxygen ? ?Post-op Assessment: Report given to RN and Post -op Vital signs reviewed and stable ? ?Post vital signs: Reviewed and stable ? ?Last Vitals:  ?Vitals Value Taken Time  ?BP 180/84 09/03/21 1236  ?Temp    ?Pulse 68 09/03/21 1236  ?Resp 41 09/03/21 1236  ?SpO2 100 % 09/03/21 1236  ?Vitals shown include unvalidated device data. ? ?Last Pain:  ?Vitals:  ? 09/03/21 0856  ?TempSrc:   ?PainSc: 0-No pain  ?   ? ?  ? ?Complications: No notable events documented. ?

## 2021-09-03 NOTE — Discharge Instructions (Signed)
? ?  [facility]  ? ?Discharge Instructions ? ?AV Fistula or Graft Surgery for Dialysis Access ? ?Please refer to the following instructions for your post-procedure care. Your surgeon or physician assistant will discuss any changes with you. ? ?Activity ? ?You may drive the day following your surgery, if you are comfortable and no longer taking prescription pain medication. Resume full activity as the soreness in your incision resolves. ? ?Bathing/Showering ? ?You may shower after you go home. Keep your incision dry for 48 hours. Do not soak in a bathtub, hot tub, or swim until the incision heals completely. You may not shower if you have a hemodialysis catheter. ? ?Incision Care ? ?Clean your incision with mild soap and water after 48 hours. Pat the area dry with a clean towel. You do not need a bandage unless otherwise instructed. Do not apply any ointments or creams to your incision. You may have skin glue on your incision. Do not peel it off. It will come off on its own in about one week. Your arm may swell a bit after surgery. To reduce swelling use pillows to elevate your arm so it is above your heart. Your doctor will tell you if you need to lightly wrap your arm with an ACE bandage. ? ?Diet ? ?Resume your normal diet. There are not special food restrictions following this procedure. In order to heal from your surgery, it is CRITICAL to get adequate nutrition. Your body requires vitamins, minerals, and protein. Vegetables are the best source of vitamins and minerals. Vegetables also provide the perfect balance of protein. Processed food has little nutritional value, so try to avoid this. ? ?Medications ? ?Resume taking all of your medications. If your incision is causing pain, you may take over-the counter pain relievers such as acetaminophen (Tylenol). If you were prescribed a stronger pain medication, please be aware these medications can cause nausea and constipation. Prevent  nausea by taking the medication with a snack or meal. Avoid constipation by drinking plenty of fluids and eating foods with high amount of fiber, such as fruits, vegetables, and grains.  ?Do not take Tylenol if you are taking prescription pain medications. ? ?Follow up ?Your surgeon may want to see you in the office following your access surgery. If so, this will be arranged at the time of your surgery. ? ?Please call us immediately for any of the following conditions: ? ?Increased pain, redness, drainage (pus) from your incision site ?Fever of 101 degrees or higher ?Severe or worsening pain at your incision site ?Hand pain or numbness. ? ?Reduce your risk of vascular disease: ? ?Stop smoking. If you would like help, call [facility] at 1-800-QUIT-NOW 913-646-9314) or [facility] at [phone] ? ?Manage your cholesterol ?Maintain a desired weight ?Control your diabetes ?Keep your blood pressure down ? ?Dialysis ? ?It will take several weeks to several months for your new dialysis access to be ready for use. Your surgeon will determine when it is okay to use it. Your nephrologist will continue to direct your dialysis. You can continue to use your Permcath until your new access is ready for use. ? ? ?09/03/2021 ?Stacie Greene ?[id number] ?22-Nov-1975 ? ?Surgeon(s): ?Cherre Robins, MD ? ?Procedure(s): ?LAPAROSCOPIC INSERTION CONTINUOUS AMBULATORY PERITONEAL DIALYSIS  (CAPD) CATHETER ?LAPAROSCOPIC OMENTOPEXY/ ? ?x May start flushing on 09/04/2021  ? ? ?If you have any questions, please call the office at [phone]. ? ?

## 2021-09-03 NOTE — Op Note (Signed)
DATE OF SERVICE: 09/03/2021 ? ?PATIENT:  Stacie Greene  46 y.o. female ? ?PRE-OPERATIVE DIAGNOSIS:  ESRD ? ?POST-OPERATIVE DIAGNOSIS:  Same ? ?PROCEDURE:   ?1) laparoscopic omentopexy ?2) laparoscopic peritoneal dialysis catheter placement ? ?SURGEON:  Surgeon(s) and Role: ?   * Cherre Robins, MD - Primary ? ?ASSISTANT: Ellsworth Lennox, RNFA ? ?An experienced assistant was required given the complexity of this procedure and the standard of surgical care. My assistant helped with exposure through counter tension, suctioning, ligation and retraction to better visualize the surgical field.  My assistant expedited sewing during the case by following my sutures. Wherever I use the term "we" in the report, my assistant actively helped me with that portion of the procedure. ? ?ANESTHESIA:   general ? ?EBL: minimal ? ?BLOOD ADMINISTERED:none ? ?DRAINS:  PD catheter to left upper quadrant   ? ?LOCAL MEDICATIONS USED:  NONE ? ?SPECIMEN:  none ? ?COUNTS: confirmed correct. ? ?TOURNIQUET:  none ? ?PATIENT DISPOSITION:  PACU - hemodynamically stable. ?  ?Delay start of Pharmacological VTE agent (>24hrs) due to surgical blood loss or risk of bleeding: no ? ?INDICATION FOR PROCEDURE: Stacie Greene is a 46 y.o. female with ESRD. She desires transition to peritoneal dialysis. After careful discussion of risks, benefits, and alternatives the patient was offered laparoscopic peritoneal dialysis catheter. The patient understood and wished to proceed. ? ?OPERATIVE FINDINGS: unremarkable PD catheter placement and omentopexy. Catheter working well at completion. ? ?DESCRIPTION OF PROCEDURE: After identification of the patient in the pre-operative holding area, the patient was transferred to the operating room. The patient was positioned supine on the operating room table. Anesthesia was induced. The abdomen was prepped and draped in standard fashion. A surgical pause was performed confirming correct patient, procedure, and operative  location. ? ?A Veress needle was introduced into the abdomen at Palmer's point, immediately below the left costal margin.  A saline drop test was used to confirm intra-abdominal position.  The Veress needle was connected to insufflation tubing and insufflation initiated.  A low opening pressure and good flow rate were noted.  A 5 mm trocar was introduced into the right upper quadrant using Visi-View technique.  The obturator was removed and the abdomen inspected with a 30 degree angled laparoscope.  No evidence of Veress needle injury was noted.  An additional 5 mm trocar was inserted a handsbreadth away to facilitate the case. ? ?The omentum was grasped with an atraumatic grasper and elevated to the upper abdomen.  A laparoscopic suture passer was used to deliver a 0 Prolene suture through the omentum.  The suture was secured down to the anterior abdominal wall with a knot.  The omentum was pexied in place. ? ?The abdomen was desufflated.  The peritoneal catheter was measured across the abdominal wall surface.  A mark was made by the cuff in the periumbilical abdomen.  The abdomen was reinsufflated.  An advantage 5 mm trocar was then used to create a skiving path through the anterior rectus fascia, rectus muscle, and peritoneum.  The laparoscopic trocar entered in the suprapubic abdomen directing towards the pelvis.  The working end of the catheter was delivered through the trocar.  The cuff was brought into the abdomen and then placed back into the rectus muscle.  The abdomen was desufflated again. ? ?A "swan-neck" extension tubing for the dialysis catheter was brought onto the field.  The catheter was laid across the desufflated abdomen to lay across the upper quadrant and exit the left upper  quadrant abdomen.  The catheter was cut.  The 2 pieces of catheter were then [facility] using a Christmas tree adapter.  This was secured in place with 2 interrupted sutures of 0 Prolene.  The connected catheter was then  tunneled to a counterincision in the upper quadrant and then out the lateral abdomen in a downward deflection.  Great care was taken to avoid twisting or kinking the catheter. ? ?The abdomen was reinsufflated.  The peritoneum was inspected to ensure the catheter did not enter the cavity.  Satisfied we desufflated the abdomen.  The catheter was connected to cystoscopy tubing.  The catheter flushed and drained without any difficulty.  The trochars were removed.  All incisions were closed with interrupted 4-0 Monocryl sutures.  Dermabond was applied.  A sterile bandage was applied to the peritoneal dialysis catheter.  ? ?Upon completion of the case instrument and sharps counts were confirmed correct. The patient was transferred to the PACU in good condition. I was present for all portions of the procedure. ? ?Yevonne Aline. Stanford Breed, MD ?[facility] [place] ?Office Phone Number: [phone] ?09/03/2021 12:18 PM ? ? ? ?

## 2021-09-03 NOTE — Interval H&P Note (Signed)
History and Physical Interval Note: ? ?09/03/2021 ?10:43 AM ? ?Stacie Greene  has presented today for surgery, with the diagnosis of ESRD.  The various methods of treatment have been discussed with the patient and family. After consideration of risks, benefits and other options for treatment, the patient has consented to  Procedure(s): ?LAPAROSCOPIC INSERTION CONTINUOUS AMBULATORY PERITONEAL DIALYSIS  (CAPD) CATHETER (N/A) ?possible, LAPAROSCOPIC OMENTOPEXY/ (N/A) as a surgical intervention.  The patient's history has been reviewed, patient examined, no change in status, stable for surgery.  I have reviewed the patient's chart and labs.  Questions were answered to the patient's satisfaction.   ? ? ?Cherre Robins ? ? ?

## 2021-09-04 ENCOUNTER — Encounter (HOSPITAL_COMMUNITY): Payer: Self-pay | Admitting: Vascular Surgery

## 2021-09-11 ENCOUNTER — Other Ambulatory Visit: Payer: Self-pay

## 2021-09-11 ENCOUNTER — Emergency Department (HOSPITAL_COMMUNITY)
Admission: EM | Admit: 2021-09-11 | Discharge: 2021-09-11 | Disposition: A | Discharge status: Home / Self Care | Payer: POS | Attending: Emergency Medicine | Admitting: Emergency Medicine

## 2021-09-11 ENCOUNTER — Encounter (HOSPITAL_COMMUNITY): Payer: Self-pay | Admitting: Emergency Medicine

## 2021-09-11 DIAGNOSIS — Z992 Dependence on renal dialysis: Secondary | ICD-10-CM | POA: Insufficient documentation

## 2021-09-11 DIAGNOSIS — Z7982 Long term (current) use of aspirin: Secondary | ICD-10-CM | POA: Diagnosis not present

## 2021-09-11 DIAGNOSIS — R112 Nausea with vomiting, unspecified: Secondary | ICD-10-CM | POA: Insufficient documentation

## 2021-09-11 DIAGNOSIS — R1033 Periumbilical pain: Secondary | ICD-10-CM | POA: Insufficient documentation

## 2021-09-11 DIAGNOSIS — R1114 Bilious vomiting: Secondary | ICD-10-CM

## 2021-09-11 DIAGNOSIS — N186 End stage renal disease: Secondary | ICD-10-CM | POA: Insufficient documentation

## 2021-09-11 LAB — COMPREHENSIVE METABOLIC PANEL
ALT: 6 U/L (ref 0–44)
AST: 18 U/L (ref 15–41)
Albumin: 3.4 g/dL — ABNORMAL LOW (ref 3.5–5.0)
Alkaline Phosphatase: 68 U/L (ref 38–126)
Anion gap: 14 (ref 5–15)
BUN: 15 mg/dL (ref 6–20)
CO2: 27 mmol/L (ref 22–32)
Calcium: 9.5 mg/dL (ref 8.9–10.3)
Chloride: 98 mmol/L (ref 98–111)
Creatinine, Ser: 12.72 mg/dL — ABNORMAL HIGH (ref 0.44–1.00)
GFR, Estimated: 3 mL/min — ABNORMAL LOW (ref >60)
Glucose, Bld: 99 mg/dL (ref 70–99)
Potassium: 4 mmol/L (ref 3.5–5.1)
Sodium: 139 mmol/L (ref 135–145)
Total Bilirubin: 0.8 mg/dL (ref 0.3–1.2)
Total Protein: 6.8 g/dL (ref 6.5–8.1)

## 2021-09-11 LAB — CBC
HCT: 30.3 % — ABNORMAL LOW (ref 36.0–46.0)
Hemoglobin: 9 g/dL — ABNORMAL LOW (ref 12.0–15.0)
MCH: 25.6 pg — ABNORMAL LOW (ref 26.0–34.0)
MCHC: 29.7 g/dL — ABNORMAL LOW (ref 30.0–36.0)
MCV: 86.3 fL (ref 80.0–100.0)
Platelets: 363 10*3/uL (ref 150–400)
RBC: 3.51 MIL/uL — ABNORMAL LOW (ref 3.87–5.11)
RDW: 17.5 % — ABNORMAL HIGH (ref 11.5–15.5)
WBC: 7.2 10*3/uL (ref 4.0–10.5)
nRBC: 0 % (ref 0.0–0.2)

## 2021-09-11 LAB — I-STAT BETA HCG BLOOD, ED (MC, WL, AP ONLY): I-stat hCG, quantitative: <5 m[IU]/mL (ref <5)

## 2021-09-11 LAB — LIPASE, BLOOD: Lipase: 22 U/L (ref 11–51)

## 2021-09-11 MED ORDER — SUCRALFATE 1 GM/10ML PO SUSP
1.0000 g | Freq: Once | ORAL | Status: AC
  Administered 2021-09-11: 1 g via ORAL
  Filled 2021-09-11: qty 10

## 2021-09-11 MED ORDER — METOCLOPRAMIDE HCL 5 MG/ML IJ SOLN
10.0000 mg | Freq: Once | INTRAMUSCULAR | Status: AC
  Administered 2021-09-11: 10 mg via INTRAVENOUS
  Filled 2021-09-11: qty 2

## 2021-09-11 MED ORDER — FAMOTIDINE IN NACL 20-0.9 MG/50ML-% IV SOLN
20.0000 mg | Freq: Once | INTRAVENOUS | Status: AC
  Administered 2021-09-11: 20 mg via INTRAVENOUS
  Filled 2021-09-11: qty 50

## 2021-09-11 MED ORDER — METOCLOPRAMIDE HCL 10 MG PO TABS: 10.0000 mg | ORAL_TABLET | Freq: Three times a day (TID) | ORAL | 0 refills | Status: DC

## 2021-09-11 MED ORDER — SODIUM CHLORIDE 0.9 % IV BOLUS
500.0000 mL | Freq: Once | INTRAVENOUS | Status: AC
  Administered 2021-09-11: 500 mL via INTRAVENOUS

## 2021-09-11 MED ORDER — FAMOTIDINE 20 MG PO TABS: 20.0000 mg | ORAL_TABLET | Freq: Two times a day (BID) | ORAL | 0 refills | Status: DC

## 2021-09-11 MED ORDER — SUCRALFATE 1 G PO TABS: 1.0000 g | ORAL_TABLET | Freq: Three times a day (TID) | ORAL | 0 refills | Status: DC

## 2021-09-11 NOTE — ED Notes (Signed)
Discharge instructions reviewed with patient. Patient verbalized understanding of instructions. Follow-up care and medications were reviewed. Patient wheeled out of ED in wheelchair. VSS upon discharge.  

## 2021-09-11 NOTE — ED Triage Notes (Signed)
Patient here with complaint of nausea and loss of appetite for the last two months. Patient has history of CKD on dialysis, recently has peritoneal dialysis setup. Patient is alert, oriented, ambulatory, and in no apparent distress at this time.

## 2021-09-11 NOTE — ED Provider Notes (Signed)
[facility] EMERGENCY DEPARTMENT Provider Note   CSN: [id number] Arrival date & time: 09/11/21  1146     History  Chief Complaint  Patient presents with   Nausea    Stacie Greene is a 46 y.o. female.  HPI Patient presents with her mother who assists with the history.  She presents with concern for ongoing nausea, postprandial emesis.  Symptoms began about 2 months ago.  Notably, the patient resumed dialysis about 6 weeks ago after symptoms were present for several weeks.  She notes that in spite of starting dialysis which presumably was going to stop her nausea, vomiting episodes, she continues to have them.  She has some periumbilical discomfort occasionally, but none currently.  No fever, diarrhea.  She did have recent placement of devices for peritoneal dialysis, but has not yet started that process.     Home Medications Prior to Admission medications   Medication Sig Start Date End Date Taking? Authorizing Provider  famotidine (PEPCID) 20 MG tablet Take 1 tablet (20 mg total) by mouth 2 (two) times daily. 09/11/21  Yes Carmin Muskrat, MD  metoCLOPramide (REGLAN) 10 MG tablet Take 1 tablet (10 mg total) by mouth 3 (three) times daily before meals for 7 days. 09/11/21 09/18/21 Yes Carmin Muskrat, MD  sucralfate (CARAFATE) 1 g tablet Take 1 tablet (1 g total) by mouth 4 (four) times daily -  with meals and at bedtime. 09/11/21  Yes Carmin Muskrat, MD  acetaminophen (TYLENOL) 500 MG tablet Take 1,000 mg by mouth every 6 (six) hours as needed.    [provider]  amLODipine (NORVASC) 5 MG tablet Take 1 tablet (5 mg total) by mouth daily. 07/30/21   Danford, Suann Larry, MD  aspirin EC 81 MG tablet Take 81 mg by mouth daily. Swallow whole.    [provider]  atorvastatin (LIPITOR) 40 MG tablet Take 1 tablet (40 mg total) by mouth daily at 6 PM. Schedule an appointment with cardiology for further refills, 1st attempt 07/29/21   Danford, Suann Larry,  MD  carvedilol (COREG) 25 MG tablet Take 1 tablet (25 mg total) by mouth 2 (two) times daily with a meal. 07/29/21   Danford, Suann Larry, MD  hydrALAZINE (APRESOLINE) 25 MG tablet Take 1 tablet (25 mg total) by mouth every 8 (eight) hours. 07/29/21   Danford, Suann Larry, MD  losartan (COZAAR) 50 MG tablet Take 1 tablet (50 mg total) by mouth daily. 07/29/21   Danford, Suann Larry, MD  oxyCODONE-acetaminophen (PERCOCET) 5-325 MG tablet Take 1 tablet by mouth every 6 (six) hours as needed for severe pain. 09/03/21   Rhyne, Hulen Shouts, PA-C  sevelamer carbonate (RENVELA) 800 MG tablet Take 2 tablets (1,600 mg total) by mouth 3 (three) times daily with meals. 07/29/21   Danford, Suann Larry, MD  ticagrelor (BRILINTA) 90 MG TABS tablet Take 1 tablet (90 mg total) by mouth 2 (two) times daily. 04/10/20   Erlene Quan, PA-C      Allergies    Patient has no known allergies.    Review of Systems   Review of Systems  All other systems reviewed and are negative.  Physical Exam Updated Vital Signs BP (!) 163/90 (BP Location: Right Arm)   Pulse 83   Temp 99.1 F (37.3 C)   Resp 18   Ht 5\' 6"  (1.676 m)   Wt 68 kg   SpO2 100%   BMI 24.20 kg/m  Physical Exam Vitals and nursing note reviewed.  Constitutional:  General: She is not in acute distress.    Appearance: She is well-developed.  HENT:     Head: Normocephalic and atraumatic.  Eyes:     Conjunctiva/sclera: Conjunctivae normal.  Cardiovascular:     Rate and Rhythm: Normal rate and regular rhythm.  Pulmonary:     Effort: Pulmonary effort is normal. No respiratory distress.     Breath sounds: Normal breath sounds. No stridor.  Abdominal:     General: There is no distension.     Tenderness: There is no abdominal tenderness. There is no guarding.     Comments: Left upper abdomen peritoneal dialysis catheter is in place  Skin:    General: Skin is warm and dry.  Neurological:     Mental Status: She is alert and oriented to  person, place, and time.     Cranial Nerves: No cranial nerve deficit.  Psychiatric:        Mood and Affect: Mood normal.    ED Results / Procedures / Treatments   Labs (all labs ordered are listed, but only abnormal results are displayed) Labs Reviewed  COMPREHENSIVE METABOLIC PANEL - Abnormal; Notable for the following components:      Result Value   Creatinine, Ser 12.72 (*)    Albumin 3.4 (*)    GFR, Estimated 3 (*)    All other components within normal limits  CBC - Abnormal; Notable for the following components:   RBC 3.51 (*)    Hemoglobin 9.0 (*)    HCT 30.3 (*)    MCH 25.6 (*)    MCHC 29.7 (*)    RDW 17.5 (*)    All other components within normal limits  LIPASE, BLOOD  URINALYSIS, ROUTINE W REFLEX MICROSCOPIC  I-STAT BETA HCG BLOOD, ED ([facility], [facility], [facility] ONLY)    EKG None  Radiology No results found.  Procedures Procedures    Medications Ordered in ED Medications  sodium chloride 0.9 % bolus 500 mL (0 mLs Intravenous Stopped 09/11/21 1827)  metoCLOPramide (REGLAN) injection 10 mg (10 mg Intravenous Given 09/11/21 1619)  famotidine (PEPCID) IVPB 20 mg premix (0 mg Intravenous Stopped 09/11/21 1827)  sucralfate (CARAFATE) 1 GM/10ML suspension 1 g (1 g Oral Given 09/11/21 1605)    ED Course/ Medical Decision Making/ A&P This patient with a Hx of end-stage renal disease, prior anemia with endoscopy and colonoscopy 2 years ago presents to the ED for concern of ongoing nausea, vomiting, episodic, this involves an extensive number of treatment options, and is a complaint that carries with it a high risk of complications and morbidity.    The differential diagnosis includes esophagitis, gastritis, esophageal dysmotility syndrome, obstruction   Social Determinants of Health:  End-stage renal disease  Additional history obtained:  Additional history and/or information obtained from mother and chart, notable for mother relates HPI.  On chart review I reviewed the  patient's colonoscopy and endoscopy with her at bedside, including endoscopy suggesting resolution of prior esophagitis   After the initial evaluation, orders, including: Labs fluids antiemetics were initiated.   Patient placed on Cardiac and Pulse-Oximetry Monitors. The patient was maintained on a cardiac monitor.  The cardiac monitored showed an rhythm of 85 sinus normal The patient was also maintained on pulse oximetry. The readings were typically 100% room air normal   On repeat evaluation of the patient improved She had 1 episode of vomiting in the ED, but at the time of my repeat evaluation was awake, alert, no distress, stating that she felt  fine. Lab Tests:  I personally interpreted labs.  The pertinent results include: Unremarkable labs for a patient on dialysis   Dispostion / Final MDM:  After consideration of the diagnostic results and the patient's response to treatment, patient will be discharged.  This adult female with ongoing dialysis, recent placement of peritoneal dialysis catheter presents with concern for ongoing episodic nausea, vomiting, but no abdominal pain currently.  She has a soft, nonperitoneal abdomen, is afebrile, low suspicion for SBP particular given the placement of her catheters after symptoms began several months ago.  History notable for prior esophagitis, some suspicion for gastroesophageal etiology for her symptoms given her otherwise reassuring findings.  Patient received fluids, antiemetics, will follow-up with outpatient GI.   Final Clinical Impression(s) / ED Diagnoses Final diagnoses:  Bilious vomiting with nausea    Rx / DC Orders ED Discharge Orders          Ordered    metoCLOPramide (REGLAN) 10 MG tablet  3 times daily before meals        09/11/21 1852    famotidine (PEPCID) 20 MG tablet  2 times daily        09/11/21 1852    sucralfate (CARAFATE) 1 g tablet  3 times daily with meals & bedtime       Note to Pharmacy: Take for one week    09/11/21 1852              Carmin Muskrat, MD 09/11/21 [id number]

## 2021-09-11 NOTE — ED Provider Triage Note (Signed)
Emergency Medicine Provider Triage Evaluation Note  Stacie Greene , a 46 y.o. female  was evaluated in triage.  Pt complains of nausea for the last several weeks.  Patient states she is post to be on dialysis but stopped going for a period of 6 months and recently restarted.  Patient has been having constant nausea since restarting.  She states that she vomits 1 time about every other day.  Nothing brings this on.  No belly pain.  No fever, chills.  Review of Systems  Positive:  Negative: See above   Physical Exam  BP (!) 155/91 (BP Location: Right Arm)   Pulse 82   Temp 99.1 F (37.3 C)   Resp 16   SpO2 98%  Gen:   Awake, no distress   Resp:  Normal effort  MSK:   Moves extremities without difficulty  Other:    Medical Decision Making  Medically screening exam initiated at 12:34 PM.  Appropriate orders placed.  Brynlee Rengel was informed that the remainder of the evaluation will be completed by another provider, this initial triage assessment does not replace that evaluation, and the importance of remaining in the ED until their evaluation is complete.     Myna Bright [place], [place] 09/11/21 1235

## 2021-09-11 NOTE — Discharge Instructions (Signed)
As discussed, with your episodic nausea, vomiting it is important that you follow-up with both your dialysis team and Dr. Juliann Mule, your gastroenterologist.  Please discuss your symptoms, today's evaluation, and appropriate additional studies, possibly including endoscopy.  Return here for concerning changes in your condition.

## 2021-09-12 ENCOUNTER — Encounter: Payer: Self-pay | Admitting: Vascular Surgery

## 2021-09-12 ENCOUNTER — Ambulatory Visit (INDEPENDENT_AMBULATORY_CARE_PROVIDER_SITE_OTHER): Payer: POS | Admitting: Vascular Surgery

## 2021-09-12 VITALS — BP 183/88 | HR 72 | Temp 97.6°F | Resp 20 | Ht 66.0 in | Wt 151.0 lb

## 2021-09-12 DIAGNOSIS — Z789 Other specified health status: Secondary | ICD-10-CM

## 2021-09-12 NOTE — Progress Notes (Signed)
Office Note     CC: Blistering surrounding laparoscopic port sites Requesting Provider:  [name], NP  HPI: Stacie Greene is a 46 y.o. (1975-07-14) female presenting in follow-up status post peritoneal dialysis catheter placement on 09/03/2021 by Dr. Stanford Breed.  She called the office earlier today with concern due to blistering surrounding her port sites.  On exam, tourniquet was accompanied by her mother.  She stated the port sites appear improved from yesterday, but was concerned about the blistering.  Denies fever, chills, but was seen in the ED yesterday for bilious vomiting which has been chronic.  She has not used her PD catheter yet.   Past Medical History:  Diagnosis Date   Acute on chronic kidney failure ([place]) 06/01/2019   Chronic kidney disease 07/12/2019   Coronary artery disease    Cyst of ovary 01/24/2020   ESRD due to hypertension ([place])    Heart attack ([place]) 2021   Hematuria 01/24/2020   Herpes    Hypertension secondary to other renal disorders 01/24/2020    Past Surgical History:  Procedure Laterality Date   APPENDECTOMY     CAPD INSERTION N/A 09/03/2021   Procedure: LAPAROSCOPIC INSERTION CONTINUOUS AMBULATORY PERITONEAL DIALYSIS  (CAPD) CATHETER;  Surgeon: Cherre Lovene Maret, MD;  Location: [facility];  Service: Vascular;  Laterality: N/A;   COLONOSCOPY WITH PROPOFOL N/A 10/18/2020   Procedure: COLONOSCOPY WITH PROPOFOL;  Surgeon: Jackquline Denmark, MD;  Location: [facility];  Service: Gastroenterology;  Laterality: N/A;   COLONOSCOPY WITH PROPOFOL N/A 10/19/2020   Procedure: COLONOSCOPY WITH PROPOFOL;  Surgeon: Mauri Pole, MD;  Location: [facility];  Service: Endoscopy;  Laterality: N/A;   CORONARY STENT INTERVENTION N/A 03/29/2020   Procedure: CORONARY STENT INTERVENTION;  Surgeon: Burnell Blanks, MD;  [facility];  Service: Cardiovascular;  Laterality: N/A;   ESOPHAGOGASTRODUODENOSCOPY (EGD) WITH PROPOFOL N/A 10/18/2020    Procedure: ESOPHAGOGASTRODUODENOSCOPY (EGD) WITH PROPOFOL;  Surgeon: Jackquline Denmark, MD;  Location: [facility];  Service: Gastroenterology;  Laterality: N/A;   HOT HEMOSTASIS N/A 10/18/2020   Procedure: HOT HEMOSTASIS (ARGON PLASMA COAGULATION/BICAP);  Surgeon: Jackquline Denmark, MD;  Location: [facility] END[facility]COPY;  Service: Gastroenterology;  Laterality: N/A;   HYSTEROPLASTY     INTRAVASCULAR ULTRASOUND/IVUS N/A 03/29/2020   Procedure: Intravascular Ultrasound/IVUS;  Surgeon: Burnell Blanks, MD;  Location: [facility];  Service: Cardiovascular;  Laterality: N/A;   LEFT HEART CATH AND CORONARY ANGIOGRAPHY N/A 03/29/2020   Procedure: LEFT HEART CATH AND CORONARY ANGIOGRAPHY;  Surgeon: Burnell Blanks, MD;  Location: [facility];  Service: Cardiovascular;  Laterality: N/A;   THYROID SURGERY      Social History   Socioeconomic History   Marital status: Single    Spouse name: Not on file   Number of children: 2   Years of education: Not on file   Highest education level: Not on file  Occupational History   Not on file  Tobacco Use   Smoking status: Former    Years: 10.00    Types: Cigarettes    Quit date: 03/29/2020    Years since quitting: 1.4   Smokeless tobacco: Never  Vaping Use   Vaping Use: Never used  Substance and Sexual Activity   Alcohol use: Not Currently   Drug use: Not Currently   Sexual activity: Yes    Birth control/protection: Condom  Other Topics Concern   Not on file  Social History Narrative   Not on file   Social Determinants of Health  Financial Resource Strain: Not on file  Food Insecurity: Not on file  Transportation Needs: Not on file  Physical Activity: Not on file  Stress: Not on file  Social Connections: Not on file  Intimate Partner Violence: Not on file   History reviewed. No pertinent family history.  Current Outpatient Medications  Medication Sig Dispense Refill   acetaminophen (TYLENOL) 500 MG tablet Take 1,000 mg  by mouth every 6 (six) hours as needed.     amLODipine (NORVASC) 5 MG tablet Take 1 tablet (5 mg total) by mouth daily. 30 tablet 3   aspirin EC 81 MG tablet Take 81 mg by mouth daily. Swallow whole.     atorvastatin (LIPITOR) 40 MG tablet Take 1 tablet (40 mg total) by mouth daily at 6 PM. Schedule an appointment with cardiology for further refills, 1st attempt 30 tablet 3   carvedilol (COREG) 25 MG tablet Take 1 tablet (25 mg total) by mouth 2 (two) times daily with a meal. 60 tablet 3   famotidine (PEPCID) 20 MG tablet Take 1 tablet (20 mg total) by mouth 2 (two) times daily. 30 tablet 0   hydrALAZINE (APRESOLINE) 25 MG tablet Take 1 tablet (25 mg total) by mouth every 8 (eight) hours. 90 tablet 3   losartan (COZAAR) 50 MG tablet Take 1 tablet (50 mg total) by mouth daily. 30 tablet 3   metoCLOPramide (REGLAN) 10 MG tablet Take 1 tablet (10 mg total) by mouth 3 (three) times daily before meals for 7 days. 21 tablet 0   oxyCODONE-acetaminophen (PERCOCET) 5-325 MG tablet Take 1 tablet by mouth every 6 (six) hours as needed for severe pain. 8 tablet 0   sevelamer carbonate (RENVELA) 800 MG tablet Take 2 tablets (1,600 mg total) by mouth 3 (three) times daily with meals. 180 tablet 3   sucralfate (CARAFATE) 1 g tablet Take 1 tablet (1 g total) by mouth 4 (four) times daily -  with meals and at bedtime. 21 tablet 0   ticagrelor (BRILINTA) 90 MG TABS tablet Take 1 tablet (90 mg total) by mouth 2 (two) times daily. 180 tablet 3   No current facility-administered medications for this visit.    No Known Allergies   REVIEW OF SYSTEMS:   [X]  denotes positive finding, [ ]  denotes negative finding Cardiac  Comments:  Chest pain or chest pressure:    Shortness of breath upon exertion:    Short of breath when lying flat:    Irregular heart rhythm:        Vascular    Pain in calf, thigh, or hip brought on by ambulation:    Pain in feet at night that wakes you up from your sleep:     Blood clot in  your veins:    Leg swelling:         Pulmonary    Oxygen at home:    Productive cough:     Wheezing:         Neurologic    Sudden weakness in arms or legs:     Sudden numbness in arms or legs:     Sudden onset of difficulty speaking or slurred speech:    Temporary loss of vision in one eye:     Problems with dizziness:         Gastrointestinal    Blood in stool:     Vomited blood:         Genitourinary    Burning when urinating:  Blood in urine:        Psychiatric    Major depression:         Hematologic    Bleeding problems:    Problems with blood clotting too easily:        Skin    Rashes or ulcers:        Constitutional    Fever or chills:      PHYSICAL EXAMINATION:  Vitals:   09/12/21 1419  BP: (!) 183/88  Pulse: 72  Resp: 20  Temp: 97.6 F (36.4 C)  SpO2: 98%  Weight: 151 lb (68.5 kg)  Height: 5\' 6"  (1.676 m)    General:  WDWN in NAD; vital signs documented above Gait: Not observed HENT: WNL, normocephalic Pulmonary: normal non-labored breathing , without wheezing Cardiac: regular HR,  Abdomen: soft, NT, no masses  Dermabond allergy with blistering Skin: without rashes Vascular Exam/Pulses:  Extremities: without ischemic changes, without Gangrene , without cellulitis; without open wounds;  Musculoskeletal: no muscle wasting or atrophy  Neurologic: A&O X 3;  No focal weakness or paresthesias are detected Psychiatric:  The pt has Normal affect.   ASSESSMENT/PLAN: Athziri Freundlich is a 46 y.o. female presenting with blistering at site of Dermabond exposure, specifically cyanoacrylate.  The remaining glue was removed in the office.  I placed the medication under her drug intolerance/adverse reaction profile.  Asked that she keep her scheduled appointment with with Dr. Stanford Breed in the coming weeks  Broadus John, MD [facility] [phone]

## 2021-09-25 ENCOUNTER — Ambulatory Visit: Payer: POS | Admitting: Family

## 2021-09-25 ENCOUNTER — Emergency Department (HOSPITAL_COMMUNITY): Payer: POS

## 2021-09-25 ENCOUNTER — Encounter (HOSPITAL_COMMUNITY): Payer: Self-pay | Admitting: Emergency Medicine

## 2021-09-25 ENCOUNTER — Other Ambulatory Visit: Payer: Self-pay

## 2021-09-25 ENCOUNTER — Inpatient Hospital Stay (HOSPITAL_COMMUNITY)
Admission: EM | Admit: 2021-09-25 | Discharge: 2021-09-27 | DRG: 291 | Disposition: A | Discharge status: Home / Self Care | Payer: POS | Attending: Internal Medicine | Admitting: Internal Medicine

## 2021-09-25 ENCOUNTER — Telehealth: Payer: Self-pay | Admitting: Family

## 2021-09-25 DIAGNOSIS — I5032 Chronic diastolic (congestive) heart failure: Secondary | ICD-10-CM | POA: Diagnosis present

## 2021-09-25 DIAGNOSIS — I252 Old myocardial infarction: Secondary | ICD-10-CM

## 2021-09-25 DIAGNOSIS — N186 End stage renal disease: Secondary | ICD-10-CM | POA: Diagnosis present

## 2021-09-25 DIAGNOSIS — I132 Hypertensive heart and chronic kidney disease with heart failure and with stage 5 chronic kidney disease, or end stage renal disease: Principal | ICD-10-CM | POA: Diagnosis present

## 2021-09-25 DIAGNOSIS — D631 Anemia in chronic kidney disease: Secondary | ICD-10-CM | POA: Diagnosis present

## 2021-09-25 DIAGNOSIS — E875 Hyperkalemia: Secondary | ICD-10-CM | POA: Diagnosis present

## 2021-09-25 DIAGNOSIS — N3000 Acute cystitis without hematuria: Secondary | ICD-10-CM

## 2021-09-25 DIAGNOSIS — N2581 Secondary hyperparathyroidism of renal origin: Secondary | ICD-10-CM | POA: Diagnosis present

## 2021-09-25 DIAGNOSIS — Z91158 Patient's noncompliance with renal dialysis for other reason: Secondary | ICD-10-CM

## 2021-09-25 DIAGNOSIS — I10 Essential (primary) hypertension: Secondary | ICD-10-CM | POA: Diagnosis present

## 2021-09-25 DIAGNOSIS — E782 Mixed hyperlipidemia: Secondary | ICD-10-CM | POA: Diagnosis present

## 2021-09-25 DIAGNOSIS — Z87891 Personal history of nicotine dependence: Secondary | ICD-10-CM

## 2021-09-25 DIAGNOSIS — Z79899 Other long term (current) drug therapy: Secondary | ICD-10-CM

## 2021-09-25 DIAGNOSIS — M898X9 Other specified disorders of bone, unspecified site: Secondary | ICD-10-CM | POA: Diagnosis present

## 2021-09-25 DIAGNOSIS — Z888 Allergy status to other drugs, medicaments and biological substances status: Secondary | ICD-10-CM

## 2021-09-25 DIAGNOSIS — B009 Herpesviral infection, unspecified: Secondary | ICD-10-CM | POA: Diagnosis present

## 2021-09-25 DIAGNOSIS — R112 Nausea with vomiting, unspecified: Secondary | ICD-10-CM | POA: Diagnosis not present

## 2021-09-25 DIAGNOSIS — Z992 Dependence on renal dialysis: Secondary | ICD-10-CM

## 2021-09-25 DIAGNOSIS — R21 Rash and other nonspecific skin eruption: Secondary | ICD-10-CM

## 2021-09-25 DIAGNOSIS — I2510 Atherosclerotic heart disease of native coronary artery without angina pectoris: Secondary | ICD-10-CM | POA: Diagnosis present

## 2021-09-25 DIAGNOSIS — N189 Chronic kidney disease, unspecified: Secondary | ICD-10-CM

## 2021-09-25 DIAGNOSIS — I160 Hypertensive urgency: Secondary | ICD-10-CM | POA: Diagnosis present

## 2021-09-25 DIAGNOSIS — Z7902 Long term (current) use of antithrombotics/antiplatelets: Secondary | ICD-10-CM

## 2021-09-25 DIAGNOSIS — Z955 Presence of coronary angioplasty implant and graft: Secondary | ICD-10-CM

## 2021-09-25 DIAGNOSIS — F419 Anxiety disorder, unspecified: Secondary | ICD-10-CM | POA: Diagnosis present

## 2021-09-25 DIAGNOSIS — Z7982 Long term (current) use of aspirin: Secondary | ICD-10-CM

## 2021-09-25 DIAGNOSIS — N19 Unspecified kidney failure: Secondary | ICD-10-CM

## 2021-09-25 HISTORY — DX: Rash and other nonspecific skin eruption: R21

## 2021-09-25 LAB — FOLATE: Folate: 6 ng/mL (ref >5.9)

## 2021-09-25 LAB — CBC WITH DIFFERENTIAL/PLATELET
Abs Immature Granulocytes: 0.04 10*3/uL (ref 0.00–0.07)
Basophils Absolute: 0.1 10*3/uL (ref 0.0–0.1)
Basophils Relative: 1 %
Eosinophils Absolute: 0.5 10*3/uL (ref 0.0–0.5)
Eosinophils Relative: 4 %
HCT: 32.6 % — ABNORMAL LOW (ref 36.0–46.0)
Hemoglobin: 9.9 g/dL — ABNORMAL LOW (ref 12.0–15.0)
Immature Granulocytes: 0 %
Lymphocytes Relative: 15 %
Lymphs Abs: 1.6 10*3/uL (ref 0.7–4.0)
MCH: 25 pg — ABNORMAL LOW (ref 26.0–34.0)
MCHC: 30.4 g/dL (ref 30.0–36.0)
MCV: 82.3 fL (ref 80.0–100.0)
Monocytes Absolute: 0.8 10*3/uL (ref 0.1–1.0)
Monocytes Relative: 7 %
Neutro Abs: 8.1 10*3/uL — ABNORMAL HIGH (ref 1.7–7.7)
Neutrophils Relative %: 73 %
Platelets: 377 10*3/uL (ref 150–400)
RBC: 3.96 MIL/uL (ref 3.87–5.11)
RDW: 17.6 % — ABNORMAL HIGH (ref 11.5–15.5)
WBC: 11.1 10*3/uL — ABNORMAL HIGH (ref 4.0–10.5)
nRBC: 0 % (ref 0.0–0.2)

## 2021-09-25 LAB — COMPREHENSIVE METABOLIC PANEL
ALT: 7 U/L (ref 0–44)
AST: 11 U/L — ABNORMAL LOW (ref 15–41)
Albumin: 3.4 g/dL — ABNORMAL LOW (ref 3.5–5.0)
Alkaline Phosphatase: 65 U/L (ref 38–126)
Anion gap: 20 — ABNORMAL HIGH (ref 5–15)
BUN: 53 mg/dL — ABNORMAL HIGH (ref 6–20)
CO2: 21 mmol/L — ABNORMAL LOW (ref 22–32)
Calcium: 9.1 mg/dL (ref 8.9–10.3)
Chloride: 101 mmol/L (ref 98–111)
Creatinine, Ser: 24.59 mg/dL — ABNORMAL HIGH (ref 0.44–1.00)
GFR, Estimated: 2 mL/min — ABNORMAL LOW (ref >60)
Glucose, Bld: 95 mg/dL (ref 70–99)
Potassium: 5.7 mmol/L — ABNORMAL HIGH (ref 3.5–5.1)
Sodium: 142 mmol/L (ref 135–145)
Total Bilirubin: 0.6 mg/dL (ref 0.3–1.2)
Total Protein: 7.2 g/dL (ref 6.5–8.1)

## 2021-09-25 LAB — URINALYSIS, ROUTINE W REFLEX MICROSCOPIC
Bacteria, UA: NONE SEEN
Bilirubin Urine: NEGATIVE
Glucose, UA: 50 mg/dL — AB
Ketones, ur: NEGATIVE mg/dL
Nitrite: NEGATIVE
Protein, ur: >=300 mg/dL — AB
Specific Gravity, Urine: 1.011 (ref 1.005–1.030)
WBC, UA: >50 WBC/hpf — ABNORMAL HIGH (ref 0–5)
pH: 8 (ref 5.0–8.0)

## 2021-09-25 LAB — RENAL FUNCTION PANEL
Albumin: 3.4 g/dL — ABNORMAL LOW (ref 3.5–5.0)
Anion gap: 10 (ref 5–15)
BUN: 13 mg/dL (ref 6–20)
CO2: 30 mmol/L (ref 22–32)
Calcium: 8.5 mg/dL — ABNORMAL LOW (ref 8.9–10.3)
Chloride: 97 mmol/L — ABNORMAL LOW (ref 98–111)
Creatinine, Ser: 8.01 mg/dL — ABNORMAL HIGH (ref 0.44–1.00)
GFR, Estimated: 6 mL/min — ABNORMAL LOW (ref >60)
Glucose, Bld: 86 mg/dL (ref 70–99)
Phosphorus: 3.7 mg/dL (ref 2.5–4.6)
Potassium: 3.4 mmol/L — ABNORMAL LOW (ref 3.5–5.1)
Sodium: 137 mmol/L (ref 135–145)

## 2021-09-25 LAB — CBC
HCT: 30.3 % — ABNORMAL LOW (ref 36.0–46.0)
Hemoglobin: 9.3 g/dL — ABNORMAL LOW (ref 12.0–15.0)
MCH: 25.2 pg — ABNORMAL LOW (ref 26.0–34.0)
MCHC: 30.7 g/dL (ref 30.0–36.0)
MCV: 82.1 fL (ref 80.0–100.0)
Platelets: 375 10*3/uL (ref 150–400)
RBC: 3.69 MIL/uL — ABNORMAL LOW (ref 3.87–5.11)
RDW: 17.7 % — ABNORMAL HIGH (ref 11.5–15.5)
WBC: 10.9 10*3/uL — ABNORMAL HIGH (ref 4.0–10.5)
nRBC: 0 % (ref 0.0–0.2)

## 2021-09-25 LAB — HEPATITIS C ANTIBODY: HCV Ab: NONREACTIVE

## 2021-09-25 LAB — HEPATITIS B SURFACE ANTIBODY,QUALITATIVE: Hep B S Ab: REACTIVE — AB

## 2021-09-25 LAB — TSH: TSH: 3.825 u[IU]/mL (ref 0.350–4.500)

## 2021-09-25 LAB — HEPATITIS B SURFACE ANTIGEN: Hepatitis B Surface Ag: NONREACTIVE

## 2021-09-25 LAB — HEPATITIS B CORE ANTIBODY, TOTAL: Hep B Core Total Ab: NONREACTIVE

## 2021-09-25 LAB — LIPASE, BLOOD: Lipase: 28 U/L (ref 11–51)

## 2021-09-25 MED ORDER — HEPARIN SODIUM (PORCINE) 1000 UNIT/ML DIALYSIS: 1000.0000 [IU] | INTRAMUSCULAR | Status: DC | PRN

## 2021-09-25 MED ORDER — ONDANSETRON 4 MG PO TBDP
4.0000 mg | ORAL_TABLET | Freq: Once | ORAL | Status: AC
  Administered 2021-09-25: 4 mg via ORAL
  Filled 2021-09-25: qty 1

## 2021-09-25 MED ORDER — ONDANSETRON HCL 4 MG/2ML IJ SOLN
4.0000 mg | Freq: Four times a day (QID) | INTRAMUSCULAR | Status: DC | PRN
  Administered 2021-09-26 (×2): 4 mg via INTRAVENOUS
  Filled 2021-09-25 (×2): qty 2

## 2021-09-25 MED ORDER — SODIUM CHLORIDE 0.9% FLUSH
3.0000 mL | Freq: Two times a day (BID) | INTRAVENOUS | Status: DC
  Administered 2021-09-25 – 2021-09-27 (×4): 3 mL via INTRAVENOUS

## 2021-09-25 MED ORDER — SODIUM CHLORIDE 0.9 % IV SOLN
1.0000 g | Freq: Once | INTRAVENOUS | Status: AC
  Administered 2021-09-25: 1 g via INTRAVENOUS
  Filled 2021-09-25: qty 10

## 2021-09-25 MED ORDER — ONDANSETRON HCL 4 MG/2ML IJ SOLN
4.0000 mg | Freq: Once | INTRAMUSCULAR | Status: DC
  Filled 2021-09-25: qty 2

## 2021-09-25 MED ORDER — ALTEPLASE 2 MG IJ SOLR
2.0000 mg | Freq: Once | INTRAMUSCULAR | Status: DC | PRN
Start: 2021-09-25 — End: 2021-09-25

## 2021-09-25 MED ORDER — PENTAFLUOROPROP-TETRAFLUOROETH EX AERO: 1.0000 "application " | INHALATION_SPRAY | CUTANEOUS | Status: DC | PRN

## 2021-09-25 MED ORDER — TRIAMCINOLONE ACETONIDE 0.1 % EX CREA
TOPICAL_CREAM | Freq: Two times a day (BID) | CUTANEOUS | Status: DC
  Filled 2021-09-25 (×2): qty 15

## 2021-09-25 MED ORDER — SODIUM CHLORIDE 0.9 % IV SOLN: 250.0000 mL | INTRAVENOUS | Status: DC | PRN

## 2021-09-25 MED ORDER — SODIUM CHLORIDE 0.9% FLUSH: 3.0000 mL | INTRAVENOUS | Status: DC | PRN

## 2021-09-25 MED ORDER — ONDANSETRON HCL 4 MG PO TABS
4.0000 mg | ORAL_TABLET | Freq: Four times a day (QID) | ORAL | Status: DC | PRN
  Administered 2021-09-25: 4 mg via ORAL
  Filled 2021-09-25: qty 1

## 2021-09-25 MED ORDER — HEPARIN SODIUM (PORCINE) 5000 UNIT/ML IJ SOLN
5000.0000 [IU] | Freq: Three times a day (TID) | INTRAMUSCULAR | Status: DC
  Administered 2021-09-25 – 2021-09-27 (×6): 5000 [IU] via SUBCUTANEOUS
  Filled 2021-09-25 (×7): qty 1

## 2021-09-25 MED ORDER — LIDOCAINE-PRILOCAINE 2.5-2.5 % EX CREA: 1.0000 "application " | TOPICAL_CREAM | CUTANEOUS | Status: DC | PRN

## 2021-09-25 MED ORDER — HYDRALAZINE HCL 20 MG/ML IJ SOLN
10.0000 mg | Freq: Three times a day (TID) | INTRAMUSCULAR | Status: DC | PRN
Start: 2021-09-25 — End: 2021-09-26
  Administered 2021-09-26: 10 mg via INTRAVENOUS
  Filled 2021-09-25: qty 1

## 2021-09-25 MED ORDER — CHLORHEXIDINE GLUCONATE CLOTH 2 % EX PADS
6.0000 | MEDICATED_PAD | Freq: Every day | CUTANEOUS | Status: DC
  Administered 2021-09-26 – 2021-09-27 (×2): 6 via TOPICAL

## 2021-09-25 MED ORDER — SODIUM ZIRCONIUM CYCLOSILICATE 10 G PO PACK
10.0000 g | PACK | Freq: Once | ORAL | Status: AC
Start: 2021-09-25 — End: 2021-09-25
  Administered 2021-09-25: 10 g via ORAL
  Filled 2021-09-25: qty 1

## 2021-09-25 MED ORDER — LIDOCAINE HCL (PF) 1 % IJ SOLN: 5.0000 mL | INTRAMUSCULAR | Status: DC | PRN

## 2021-09-25 MED ORDER — NEPRO/CARBSTEADY PO LIQD
237.0000 mL | Freq: Two times a day (BID) | ORAL | Status: DC
  Administered 2021-09-26: 237 mL via ORAL

## 2021-09-25 MED ORDER — ANTICOAGULANT SODIUM CITRATE 4% (200MG/5ML) IV SOLN
5.0000 mL | Status: DC | PRN
  Filled 2021-09-25: qty 5

## 2021-09-25 MED ORDER — ACETAMINOPHEN 325 MG PO TABS
650.0000 mg | ORAL_TABLET | Freq: Four times a day (QID) | ORAL | Status: DC | PRN
  Administered 2021-09-25 – 2021-09-26 (×3): 650 mg via ORAL
  Filled 2021-09-25 (×3): qty 2

## 2021-09-25 MED ORDER — LABETALOL HCL 5 MG/ML IV SOLN
10.0000 mg | Freq: Once | INTRAVENOUS | Status: AC
  Administered 2021-09-25: 10 mg via INTRAVENOUS
  Filled 2021-09-25: qty 4

## 2021-09-25 MED ORDER — SODIUM CHLORIDE 0.9 % IV BOLUS
500.0000 mL | Freq: Once | INTRAVENOUS | Status: AC
  Administered 2021-09-25: 500 mL via INTRAVENOUS

## 2021-09-25 MED ORDER — LORAZEPAM 1 MG PO TABS
1.0000 mg | ORAL_TABLET | Freq: Once | ORAL | Status: AC
  Administered 2021-09-25: 1 mg via ORAL
  Filled 2021-09-25: qty 1

## 2021-09-25 MED ORDER — ACETAMINOPHEN 650 MG RE SUPP: 650.0000 mg | Freq: Four times a day (QID) | RECTAL | Status: DC | PRN

## 2021-09-25 NOTE — Progress Notes (Signed)
Pt c/o upset stomach. Pt out of uf and 17ml ns given. RN notified

## 2021-09-25 NOTE — ED Provider Notes (Signed)
[facility] EMERGENCY DEPARTMENT Provider Note   CSN: [id number] Arrival date & time: 09/25/21  0855     History  Chief Complaint  Patient presents with   Vomiting    Stacie Greene is a 46 y.o. female.  Pt is a 46 yo female with a pmhx significant for ESRD, HTN, and CAD (stent placed 03/29/20).  Pt said she is currently getting HD, but is switching over to peritoneal dialysis.  PD catheter placed on 09/03/21.  However, she is not fully doing PD yet.  She last had HD 2 days ago.  She did not go today because she felt too sick.  She developed n/v for 2 days.  She denies any pain.  She's had trouble sleeping.  She still urinates 2-3 times per day.  She denies fever. She did not take her meds this am due to n/v.       Home Medications Prior to Admission medications   Medication Sig Start Date End Date Taking? Authorizing Provider  acetaminophen (TYLENOL) 500 MG tablet Take 1,000 mg by mouth every 6 (six) hours as needed for mild pain.   Yes [provider]  amLODipine (NORVASC) 5 MG tablet Take 1 tablet (5 mg total) by mouth daily. 07/30/21  Yes Danford, Suann Larry, MD  aspirin EC 81 MG tablet Take 81 mg by mouth daily. Swallow whole.   Yes [provider]  atorvastatin (LIPITOR) 40 MG tablet Take 1 tablet (40 mg total) by mouth daily at 6 PM. Schedule an appointment with cardiology for further refills, 1st attempt 07/29/21  Yes Danford, Suann Larry, MD  carvedilol (COREG) 25 MG tablet Take 1 tablet (25 mg total) by mouth 2 (two) times daily with a meal. 07/29/21  Yes Danford, Suann Larry, MD  famotidine (PEPCID) 20 MG tablet Take 1 tablet (20 mg total) by mouth 2 (two) times daily. 09/11/21  Yes Carmin Muskrat, MD  gentamicin cream (GARAMYCIN) 0.1 % Apply 1 application. topically daily. 09/04/21  Yes [provider]  hydrALAZINE (APRESOLINE) 25 MG tablet Take 1 tablet (25 mg total) by mouth every 8 (eight) hours. 07/29/21  Yes Danford,  Suann Larry, MD  losartan (COZAAR) 50 MG tablet Take 1 tablet (50 mg total) by mouth daily. 07/29/21  Yes Danford, Suann Larry, MD  metoCLOPramide (REGLAN) 10 MG tablet Take 1 tablet (10 mg total) by mouth 3 (three) times daily before meals for 7 days. 09/11/21 09/25/21 Yes Carmin Muskrat, MD  sevelamer carbonate (RENVELA) 800 MG tablet Take 2 tablets (1,600 mg total) by mouth 3 (three) times daily with meals. 07/29/21  Yes Danford, Suann Larry, MD  sucralfate (CARAFATE) 1 g tablet Take 1 tablet (1 g total) by mouth 4 (four) times daily -  with meals and at bedtime. 09/11/21  Yes Carmin Muskrat, MD  ticagrelor (BRILINTA) 90 MG TABS tablet Take 1 tablet (90 mg total) by mouth 2 (two) times daily. 04/10/20  Yes Kilroy, Doreene Burke, PA-C  oxyCODONE-acetaminophen (PERCOCET) 5-325 MG tablet Take 1 tablet by mouth every 6 (six) hours as needed for severe pain. Patient not taking: Reported on 09/25/2021 09/03/21   Gabriel Earing, PA-C      Allergies    2-octylcyanoacrylate [cyanoacrylate]    Review of Systems   Review of Systems  Gastrointestinal:  Positive for nausea and vomiting.  All other systems reviewed and are negative.   Physical Exam Updated Vital Signs BP (!) 178/102 Comment: 2L [place]  Pulse 94 Comment: 2L [place]  Temp 98.4 F (36.9 C) (Oral)   Resp (!) 30 Comment: 2L [place]  SpO2 95% Comment: 2L [place] Physical Exam Vitals and nursing note reviewed.  Constitutional:      Appearance: Normal appearance.  HENT:     Head: Normocephalic and atraumatic.     Right Ear: External ear normal.     Left Ear: External ear normal.     Nose: Nose normal.     Mouth/Throat:     Mouth: Mucous membranes are dry.  Eyes:     Extraocular Movements: Extraocular movements intact.     Conjunctiva/sclera: Conjunctivae normal.     Pupils: Pupils are equal, round, and reactive to light.  Cardiovascular:     Rate and Rhythm: Regular rhythm. Tachycardia present.     Pulses: Normal pulses.     Heart sounds: Normal  heart sounds.  Pulmonary:     Effort: Pulmonary effort is normal.     Breath sounds: Normal breath sounds.  Abdominal:     General: Abdomen is flat. Bowel sounds are normal.     Palpations: Abdomen is soft.     Comments: PD cath noted  Musculoskeletal:        General: Normal range of motion.     Cervical back: Normal range of motion and neck supple.     Comments: + AVF with good thrill left forearm  Skin:    General: Skin is warm.     Capillary Refill: Capillary refill takes less than 2 seconds.  Neurological:     General: No focal deficit present.     Mental Status: She is alert and oriented to person, place, and time.  Psychiatric:        Mood and Affect: Mood normal.        Behavior: Behavior normal.     ED Results / Procedures / Treatments   Labs (all labs ordered are listed, but only abnormal results are displayed) Labs Reviewed  COMPREHENSIVE METABOLIC PANEL - Abnormal; Notable for the following components:      Result Value   Potassium 5.7 (*)    CO2 21 (*)    BUN 53 (*)    Creatinine, Ser 24.59 (*)    Albumin 3.4 (*)    AST 11 (*)    GFR, Estimated 2 (*)    Anion gap 20 (*)    All other components within normal limits  CBC WITH DIFFERENTIAL/PLATELET - Abnormal; Notable for the following components:   WBC 11.1 (*)    Hemoglobin 9.9 (*)    HCT 32.6 (*)    MCH 25.0 (*)    RDW 17.6 (*)    Neutro Abs 8.1 (*)    All other components within normal limits  URINALYSIS, ROUTINE W REFLEX MICROSCOPIC - Abnormal; Notable for the following components:   APPearance HAZY (*)    Glucose, UA 50 (*)    Hgb urine dipstick SMALL (*)    Protein, ur >=300 (*)    Leukocytes,Ua MODERATE (*)    WBC, UA >50 (*)    All other components within normal limits  URINE CULTURE  LIPASE, BLOOD  POTASSIUM  TSH  HEPATITIS B SURFACE ANTIGEN  HEPATITIS B SURFACE ANTIBODY,QUALITATIVE  HEPATITIS B SURFACE ANTIBODY, QUANTITATIVE  HEPATITIS B CORE ANTIBODY, TOTAL  HEPATITIS C ANTIBODY   FOLATE  RENAL FUNCTION PANEL  CBC  I-STAT CHEM 8, ED    EKG EKG Interpretation  Date/Time:  Thursday September 25 2021 09:02:28 EDT Ventricular Rate:  102 PR  Interval:  142 QRS Duration: 66 QT Interval:  374 QTC Calculation: 487 R Axis:   93 Text Interpretation: Sinus tachycardia Possible Left atrial enlargement Rightward axis Anterior infarct , age undetermined Abnormal ECG When compared with ECG of 25-Jul-2021 11:14, PREVIOUS ECG IS PRESENT Confirmed by Isla Pence [phone]) on 09/25/2021 11:24:57 AM  Radiology DG Chest Portable 1 View  Result Date: 09/25/2021 CLINICAL DATA:  Vomiting and shortness of breath. History of end-stage renal disease. EXAM: PORTABLE CHEST 1 VIEW COMPARISON:  10/16/2020 chest radiograph FINDINGS: UPPER limits normal heart size and pulmonary vascular congestion noted. Minimal interstitial opacities are present likely representing very mild interstitial edema. Trace bilateral pleural effusions are noted. There is no evidence of pneumothorax. Surgical clips within the RIGHT neck are again noted. No acute bony abnormalities are present. IMPRESSION: UPPER limits normal heart size with pulmonary vascular congestion and probable very mild interstitial edema. Trace bilateral pleural effusions. Electronically Signed   By: Margarette [place] M.D.   On: 09/25/2021 14:04    Procedures Procedures    Medications Ordered in ED Medications  Chlorhexidine Gluconate Cloth 2 % PADS 6 each (has no administration in time range)  pentafluoroprop-tetrafluoroeth (GEBAUERS) aerosol 1 application  (has no administration in time range)  lidocaine (PF) (XYLOCAINE) 1 % injection 5 mL (has no administration in time range)  lidocaine-prilocaine (EMLA) cream 1 application  (has no administration in time range)  heparin injection 1,000 Units (has no administration in time range)  anticoagulant sodium citrate solution 5 mL (has no administration in time range)  alteplase (CATHFLO ACTIVASE)  injection 2 mg (has no administration in time range)  triamcinolone cream (KENALOG) 0.1 % cream (0 application  Topical Hold 09/25/21 1543)  heparin injection 5,000 Units (5,000 Units Subcutaneous Given 09/25/21 1541)  sodium chloride flush (NS) 0.9 % injection 3 mL (3 mLs Intravenous Not Given 09/25/21 1434)  sodium chloride flush (NS) 0.9 % injection 3 mL (has no administration in time range)  0.9 %  sodium chloride infusion (has no administration in time range)  acetaminophen (TYLENOL) tablet 650 mg (has no administration in time range)    Or  acetaminophen (TYLENOL) suppository 650 mg (has no administration in time range)  ondansetron (ZOFRAN) tablet 4 mg (has no administration in time range)    Or  ondansetron (ZOFRAN) injection 4 mg (has no administration in time range)  hydrALAZINE (APRESOLINE) injection 10 mg (has no administration in time range)  ondansetron (ZOFRAN-ODT) disintegrating tablet 4 mg (4 mg Oral Given 09/25/21 0912)  sodium chloride 0.9 % bolus 500 mL (0 mLs Intravenous Stopped 09/25/21 1434)  cefTRIAXone (ROCEPHIN) 1 g in sodium chloride 0.9 % 100 mL IVPB (0 g Intravenous Stopped 09/25/21 1347)  labetalol (NORMODYNE) injection 10 mg (10 mg Intravenous Given 09/25/21 1332)  LORazepam (ATIVAN) tablet 1 mg (1 mg Oral Given 09/25/21 1303)  ondansetron (ZOFRAN-ODT) disintegrating tablet 4 mg (4 mg Oral Given 09/25/21 1303)  sodium zirconium cyclosilicate (LOKELMA) packet 10 g (10 g Oral Given 09/25/21 1346)    ED Course/ Medical Decision Making/ A&P                           Medical Decision Making Amount and/or Complexity of Data Reviewed Labs: ordered. Radiology: ordered.  Risk Prescription drug management. Decision regarding hospitalization.   This patient presents to the ED for concern of n/v, this involves an extensive number of treatment options, and is a complaint that carries with it a  high risk of complications and morbidity.  The differential diagnosis includes infection,  dialysis complication   Co morbidities that complicate the patient evaluation  ESRD, HTN, and CAD    Additional history obtained:  Additional history obtained from epic chart review   Lab Tests:  I Ordered, and personally interpreted labs.  The pertinent results include:  cbc with chronic anemia (hgb 9.9), cmp with K 5.7, BUN 53, and Cr 24.59; UA + for UTI.   Imaging Studies ordered:  I ordered imaging studies including cxr  I independently visualized and interpreted imaging which showed  IMPRESSION:  UPPER limits normal heart size with pulmonary vascular congestion  and probable very mild interstitial edema. Trace bilateral pleural  effusions.   I agree with the radiologist interpretation   Cardiac Monitoring:  The patient was maintained on a cardiac monitor.  I personally viewed and interpreted the cardiac monitored which showed an underlying rhythm of: st   Medicines ordered and prescription drug management:  I ordered medication including rocephin  for uti, zofran for n/v, labetalol for htn  Reevaluation of the patient after these medicines showed that the patient improved I have reviewed the patients home medicines and have made adjustments as needed   Critical Interventions:  Hyperkalemia tx   Consultations Obtained:  I requested consultation with the nephrologist (Dr. Carolin Sicks),  and discussed lab and imaging findings as well as pertinent plan - he recommends admission to medicine.  He will put her on the list for dialysis. Pt d/w Dr. Rogers Blocker ([facility]) for admission.   Problem List / ED Course:  Hyperkalemia:  pt treated with lokelma.  She needs dialysis. HTN:  pt given labetalol N/v:  improved with zofran Uti:  pt given a dose of Rocephin.  Urine sent for culture.   Reevaluation:  After the interventions noted above, I reevaluated the patient and found that they have :improved   Social Determinants of Health:  Lives at  home   Dispostion:  After consideration of the diagnostic results and the patients response to treatment, I feel that the patent would benefit from admission.    CRITICAL CARE Performed by: Isla Pence   Total critical care time: 30 minutes  Critical care time was exclusive of separately billable procedures and treating other patients.  Critical care was necessary to treat or prevent imminent or life-threatening deterioration.  Critical care was time spent personally by me on the following activities: development of treatment plan with patient and/or surrogate as well as nursing, discussions with consultants, evaluation of patient's response to treatment, examination of patient, obtaining history from patient or surrogate, ordering and performing treatments and interventions, ordering and review of laboratory studies, ordering and review of radiographic studies, pulse oximetry and re-evaluation of patient's condition.         Final Clinical Impression(s) / ED Diagnoses Final diagnoses:  ESRD on hemodialysis ([place])  Hypertensive urgency  Acute cystitis without hematuria  Hyperkalemia  Uremia  Nausea and vomiting, unspecified vomiting type    Rx / DC Orders ED Discharge Orders     None         Isla Pence, MD 09/25/21 1548

## 2021-09-25 NOTE — Assessment & Plan Note (Signed)
Continue lipitor  ?

## 2021-09-25 NOTE — Assessment & Plan Note (Addendum)
HTN urgency on arrival with bp 211/121. She states her blood pressure has been elevated over the past few weeks which could also be contributing to her N/V. No other symptoms.  Received labetalol IV in ED with improvement.  -Continue home regimen of Norvasc 5 mg daily, hydralazine 25 mg 3 times daily, Coreg 25 mg twice daily, Cozaar 50 mg daily. -IV hydralazine as needed. -Patient also on hemodialysis.

## 2021-09-25 NOTE — Telephone Encounter (Signed)
Pt called in to reschedule appointment on September 25, 2021 at 10:40 am with [name] due to dizziness making it impossible to drive. I have sent pt to triage for further evaluation. Due to the triage process being in progress, I have canceled the Pt's appointment until further advice on when/where she should be seen is provided.

## 2021-09-25 NOTE — Assessment & Plan Note (Signed)
Triamcinolone BID x 7 days

## 2021-09-25 NOTE — Assessment & Plan Note (Addendum)
Recently transitioned from HD to PD this past Monday, currently in training and has only been doing PD. Missed 2 days of PD. AG of 20 with creatinine of 24.59 on presentation.  Nephrology has been consulted. Will likely need more than one HD session to get her back on track.  Potassium elevated, given lokelma. -Patient underwent hemodialysis 09/25/2021 and due for hemodialysis today 09/26/2021.  -Per nephrology.

## 2021-09-25 NOTE — Progress Notes (Signed)
Report given and acknowledged by H.Obas, RN. Questions/concerns addressed.

## 2021-09-25 NOTE — ED Provider Triage Note (Signed)
Emergency Medicine Provider Triage Evaluation Note  Krupa Stege , a 46 y.o. female  was evaluated in triage.  Pt complains of nausea and vomiting this week, similar to when she was in the ER 2 weeks ago. New peritoneal dialysis, missed the last 2 days, makes urine. No fevers, no abdominal pain, no changes in bowel habits. No drug or etoh use, no marijuana.   Review of Systems  Positive: N/v Negative: Fevers, chills, abdominal pain  Physical Exam  BP (!) 211/121 (BP Location: Right Arm)   Pulse (!) 105   Temp 98.4 F (36.9 C) (Oral)   Resp 16   SpO2 96%  Gen:   Awake, no distress   Resp:  Normal effort  MSK:   Moves extremities without difficulty  Other:  tearful  Medical Decision Making  Medically screening exam initiated at 9:08 AM.  Appropriate orders placed.  Providence Valcarcel was informed that the remainder of the evaluation will be completed by another provider, this initial triage assessment does not replace that evaluation, and the importance of remaining in the ED until their evaluation is complete.     Tacy Learn, PA-C 09/25/21 [phone]

## 2021-09-25 NOTE — Assessment & Plan Note (Addendum)
46 year old female with 2 week history of intractable nausea with some vomiting that improved s/p medication; however, over past 2 days she had recurrence of nausea with emesis and elevated blood pressure that resulted in her missing 2 sessions of her PD.  -more intractable nausea with one episode of vomiting/daily. Not associated with food/abdominal pain/BM changes. No pain on exam  Differential includes PUD, uremia, HTN, idiopathic gastroparesis. Normal lipase and transaminases. Most likely related to her uremia.  -Patient with some clinical improvement after first hemodialysis session. -Patient seen by nephrology and underwent hemodialysis on day of admission 09/25/2021 and patient for another round of hemodialysis today 09/26/2021 dialysis planned for today. -Patient changed from Pepcid to Protonix and on Zofran as needed. -has GI appointment scheduled 6/22.  -UA with abnormal urine; however, she has no  Urinary symptoms. Given rocephin in Ed. Will hold on further abx since asymptomatic until culture results.  -Nephrology following and appreciate input and recommendations.

## 2021-09-25 NOTE — ED Notes (Signed)
Unable to get blood from patient, MD notified labs will have ot be drawn in dialysis

## 2021-09-25 NOTE — Assessment & Plan Note (Addendum)
Patient was euvolemic on examination during the hospitalization.  CXR with interstitial edema, not requiring any oxygen  Given 500cc bolus in ED Last echo: 4/23: EF of 60-65% with grade 1 DD Strict I/o Volume control via HD.

## 2021-09-25 NOTE — ED Triage Notes (Signed)
Pt states she has been vomiting since Monday off and on. Her last dialysis treatment was Monday as well. She skipped yesterday due to feeling poorly. Today, pt feels a little dizzy due to not taking her BP today and her BP is elevated. Denies any CP.

## 2021-09-25 NOTE — Progress Notes (Signed)
Previous note created in error.

## 2021-09-25 NOTE — Assessment & Plan Note (Addendum)
-  secondary to missed dialysis Medically managed with [facility] Nephrology consulted and patient underwent hemodialysis on 09/25/2021 and patient for another session of hemodialysis today 09/26/2021.  -Potassium currently at 5.2 today.  Keep on telemetry. -Repeat labs in the a.m.

## 2021-09-25 NOTE — Assessment & Plan Note (Addendum)
Secondary to anemia of chronic disease.   -History of aranesp Folate at 6.0.   -Per nephrology.

## 2021-09-25 NOTE — H&P (Signed)
History and Physical    PatientDeloras Greene VZC:[id number] DOB: February 03, 1976 DOA: 09/25/2021 DOS: the patient was seen and examined on 09/25/2021 PCP: [name], NP  Patient coming from: Home - lives alone. Mother has been staying with her recently.    Chief Complaint: nausea and vomiting  HPI: Stacie Greene is a 46 y.o. female with medical history significant of ESRD on dialysis, HTN, CAD with DES to LAD, HLD, grade 1 diastolic CHF, recurrent HSV who presented to Ed with complaints of nausea as well as missed dialysis session x1 and elevated blood pressure. She states about 2 weeks ago she had some vomiting spells and came to ER and was given some medication (pepcid, reglan and carafate)  She thought she was getting better and set up appointment to see GI on 6/22. Over the past 2 days she started to have nausea and vomiting again.  She has had about 1 episode of vomiting for past 2 days. Constantly nauseated. She has been able to take her medication. She doesn't feel that the N/V is worse with eating or certain foods. Nothing makes it worse. No associated stomach pain. No diarrhea. No sick contacts. She states her blood pressure has been running high lately. She does not use NSAIDs, drinks caffeine and is stressed due to being out of work for the past month. No hx of international travel.   She is currently doing PD training and doing PD sessions since this Monday. She has missed two days of this due to her N/V.    Denies any fever/chills, vision changes/headaches, chest pain or palpitations, shortness of breath, abdominal pain, no diarrhea, dysuria or leg swelling. She has had some vaginal itching. No urinary frequency or urgency.   She does not smoke or drink alcohol.   ER Course:  vitals: afebrile, bp: 211/121, HR; 105, RR 16, oxygen: 96% Ra Pertinent labs: wbc: 11.1, hgb: 9.9, potassium: 5.7, BUN: 53, creatinine: 24.59, AG: 20,  CXR: pulmonary vascular congestion and probably very  mild interstitial edema. Trace bilateral pleural effusions.  In ED: nephrology consulted. Given labetalol, rocephin for possible UTI, zofran and 500cc bolus as well as lokelma.    Review of Systems: As mentioned in the history of present illness. All other systems reviewed and are negative. Past Medical History:  Diagnosis Date   Acute on chronic kidney failure ([place]) 06/01/2019   Chronic kidney disease 07/12/2019   Coronary artery disease    Cyst of ovary 01/24/2020   ESRD due to hypertension ([place])    Heart attack ([place]) 2021   Hematuria 01/24/2020   Herpes    Hypertension secondary to other renal disorders 01/24/2020   Past Surgical History:  Procedure Laterality Date   APPENDECTOMY     CAPD INSERTION N/A 09/03/2021   Procedure: LAPAROSCOPIC INSERTION CONTINUOUS AMBULATORY PERITONEAL DIALYSIS  (CAPD) CATHETER;  Surgeon: Cherre Robins, MD;  Location: [place];  Service: Vascular;  Laterality: N/A;   COLONOSCOPY WITH PROPOFOL N/A 10/18/2020   Procedure: COLONOSCOPY WITH PROPOFOL;  Surgeon: Jackquline Denmark, MD;  Location: [place];  Service: Gastroenterology;  Laterality: N/A;   COLONOSCOPY WITH PROPOFOL N/A 10/19/2020   Procedure: COLONOSCOPY WITH PROPOFOL;  Surgeon: Mauri Pole, MD;  Location: [place] ENDOSCOPY;  Service: Endoscopy;  Laterality: N/A;   CORONARY STENT INTERVENTION N/A 03/29/2020   Procedure: CORONARY STENT INTERVENTION;  Surgeon: Burnell Blanks, MD;  Location: [place] CV LAB;  Service: Cardiovascular;  Laterality: N/A;   ESOPHAGOGASTRODUODENOSCOPY (EGD) WITH PROPOFOL N/A 10/18/2020  Procedure: ESOPHAGOGASTRODUODENOSCOPY (EGD) WITH PROPOFOL;  Surgeon: Jackquline Denmark, MD;  Location: [place];  Service: Gastroenterology;  Laterality: N/A;   HOT HEMOSTASIS N/A 10/18/2020   Procedure: HOT HEMOSTASIS (ARGON PLASMA COAGULATION/BICAP);  Surgeon: Jackquline Denmark, MD;  Location: [facility] ENDOSCOPY;  Service: Gastroenterology;  Laterality: N/A;   HYSTEROPLASTY      INTRAVASCULAR ULTRASOUND/IVUS N/A 03/29/2020   Procedure: Intravascular Ultrasound/IVUS;  Surgeon: Burnell Blanks, MD;  Location: [place] CV LAB;  Service: Cardiovascular;  Laterality: N/A;   LEFT HEART CATH AND CORONARY ANGIOGRAPHY N/A 03/29/2020   Procedure: LEFT HEART CATH AND CORONARY ANGIOGRAPHY;  Surgeon: Burnell Blanks, MD;  Location: [place] CV LAB;  Service: Cardiovascular;  Laterality: N/A;   THYROID SURGERY     Social History:  reports that she quit smoking about 17 months ago. Her smoking use included cigarettes. She has never used smokeless tobacco. She reports that she does not currently use alcohol. She reports that she does not currently use drugs.  Allergies  Allergen Reactions   2-Octylcyanoacrylate [Cyanoacrylate] Other (See Comments)    Blistering from Dermabond, see photo in the chart.     No family history on file.  Prior to Admission medications   Medication Sig Start Date End Date Taking? Authorizing Provider  acetaminophen (TYLENOL) 500 MG tablet Take 1,000 mg by mouth every 6 (six) hours as needed.    [provider]  amLODipine (NORVASC) 5 MG tablet Take 1 tablet (5 mg total) by mouth daily. 07/30/21   Danford, Suann Larry, MD  aspirin EC 81 MG tablet Take 81 mg by mouth daily. Swallow whole.    [provider]  atorvastatin (LIPITOR) 40 MG tablet Take 1 tablet (40 mg total) by mouth daily at 6 PM. Schedule an appointment with cardiology for further refills, 1st attempt 07/29/21   Danford, Suann Larry, MD  carvedilol (COREG) 25 MG tablet Take 1 tablet (25 mg total) by mouth 2 (two) times daily with a meal. 07/29/21   Danford, Suann Larry, MD  famotidine (PEPCID) 20 MG tablet Take 1 tablet (20 mg total) by mouth 2 (two) times daily. 09/11/21   Carmin Muskrat, MD  hydrALAZINE (APRESOLINE) 25 MG tablet Take 1 tablet (25 mg total) by mouth every 8 (eight) hours. 07/29/21   Danford, Suann Larry, MD  losartan (COZAAR) 50  MG tablet Take 1 tablet (50 mg total) by mouth daily. 07/29/21   Danford, Suann Larry, MD  metoCLOPramide (REGLAN) 10 MG tablet Take 1 tablet (10 mg total) by mouth 3 (three) times daily before meals for 7 days. 09/11/21 09/18/21  Carmin Muskrat, MD  oxyCODONE-acetaminophen (PERCOCET) 5-325 MG tablet Take 1 tablet by mouth every 6 (six) hours as needed for severe pain. 09/03/21   Rhyne, Hulen Shouts, PA-C  sevelamer carbonate (RENVELA) 800 MG tablet Take 2 tablets (1,600 mg total) by mouth 3 (three) times daily with meals. 07/29/21   Danford, Suann Larry, MD  sucralfate (CARAFATE) 1 g tablet Take 1 tablet (1 g total) by mouth 4 (four) times daily -  with meals and at bedtime. 09/11/21   Carmin Muskrat, MD  ticagrelor (BRILINTA) 90 MG TABS tablet Take 1 tablet (90 mg total) by mouth 2 (two) times daily. 04/10/20   Erlene Quan, PA-C    Physical Exam: Vitals:   09/25/21 1145 09/25/21 1315 09/25/21 1328 09/25/21 1400  BP: (!) 201/110  (!) 188/99 (!) 188/117  Pulse: (!) 105 99 99 93  Resp: 18 (!) 29 (!) 29 14  Temp:      TempSrc:      SpO2: 98% 97% 97% 94%   General:  Appears calm and comfortable and is in NAD Eyes:  PERRL, EOMI, normal lids, iris ENT:  grossly normal hearing, lips & tongue, mmm; appropriate dentition Neck:  no LAD, masses or thyromegaly; no carotid bruits Cardiovascular:  RRR, no m/r/g. No LE edema.  Respiratory:   left lower lobe crackles. No wheezing Normal respiratory effort. Abdomen:  soft, NT, ND, NABS Back:   normal alignment, no CVAT Skin:  localized maculopapular rash to abdomen. No erythema.  Musculoskeletal:  grossly normal tone BUE/BLE, good ROM, no bony abnormality Lower extremity:  No LE edema.  Limited foot exam with no ulcerations.  2+ distal pulses. Psychiatric:  grossly normal mood and affect, speech fluent and appropriate, AOx3 Neurologic:  CN 2-12 grossly intact, moves all extremities in coordinated fashion, sensation intact   Radiological Exams on  Admission: Independently reviewed - see discussion in A/P where applicable  DG Chest Portable 1 View  Result Date: 09/25/2021 CLINICAL DATA:  Vomiting and shortness of breath. History of end-stage renal disease. EXAM: PORTABLE CHEST 1 VIEW COMPARISON:  10/16/2020 chest radiograph FINDINGS: UPPER limits normal heart size and pulmonary vascular congestion noted. Minimal interstitial opacities are present likely representing very mild interstitial edema. Trace bilateral pleural effusions are noted. There is no evidence of pneumothorax. Surgical clips within the RIGHT neck are again noted. No acute bony abnormalities are present. IMPRESSION: UPPER limits normal heart size with pulmonary vascular congestion and probable very mild interstitial edema. Trace bilateral pleural effusions. Electronically Signed   By: Margarette Canada M.D.   On: 09/25/2021 14:04    EKG: Independently reviewed.  Sinus tachycardia with rate 102; nonspecific ST changes with no evidence of acute ischemia   Labs on Admission: I have personally reviewed the available labs and imaging studies at the time of the admission.  Pertinent labs:   wbc: 11.1,  hgb: 9.9,  potassium: 5.7,  BUN: 53,  creatinine: 24.59,  AG: 20,   Assessment and Plan: Principal Problem:   Intractable nausea and vomiting Active Problems:   Hyperkalemia   ESRD (end stage renal disease) on dialysis [facility])   Essential hypertension   Coronary artery disease involving native coronary artery of native heart without angina pectoris   Chronic diastolic CHF (congestive heart failure) (HCC)   Mixed hyperlipidemia   localized maulopapular rash of abdomen    History of anemia due to chronic kidney disease    Assessment and Plan: * Intractable nausea and vomiting 46 year old female with 2 week history of intractable nausea with some vomiting that improved s/p medication; however, over past 2 days she had recurrence of nausea with emesis and elevated blood pressure  that resulted in her missing 2 sessions of her PD.  -obs to telemetry -more intractable nausea with one episode of vomiting/daily. Not associated with food/abdominal pain/BM changes. No pain on exam  Differential includes PUD, uremia, HTN, idiopathic gastroparesis. Normal lipase and transaminases. Most likely related to her uremia. Dialysis planned for today  -stop pepcid and change to protonix, zofran PRN -has GI appointment scheduled 6/22.  -UA with abnormal urine; however, she has no  Urinary symptoms. Given rocephin in Ed. Will hold on further abx since asymptomatic until culture results.   Hyperkalemia secondary to missed dialysis Medically managed with [facility] Nephrology consulted with plans for dialysis Keep on telemetry Trend   ESRD (end stage renal disease) on dialysis ([place])  Recently transitioned from HD to PD this past Monday, currently in training and has only been doing PD. Missed 2 days of PD. AG of 20 with creatinine of 24.59.  Nephrology has been consulted. Will likely need more than one HD session to get her back on track.  Potassium elevated, given lokelma with plans for dialysis today  Appreciate nephrology   Essential hypertension HTN urgency on arrival with bp 211/121. She states her blood pressure has been elevated over the past few weeks which could also be contributing to her N/V. No other symptoms.  Received labetalol IV in ED with improvement.  Re start home medication of norvasc 5mg  daily, hydralazine 25mg  TID, coreg 25mg  BID. Will start cozaar 50mg  tomorrow since has  Hyperkalemia and will get dialysis today -hydralazine IV prn   Coronary artery disease involving native coronary artery of native heart without angina pectoris Hx of NSTEMI in 03/2020 Cardiac catheterization revealed a 99% proximal LAD with right to left collaterals, 70% mid LAD, 40% distal LAD.  Patient had PCI of the LAD with 2 drug-eluting stents. Continue medical management with ASA, brilinta  and statin   Chronic diastolic CHF (congestive heart failure) (HCC) Appears euvolemic on exam  CXR with interstitial edema, not requiring any oxygen  Given 500cc bolus in ED Last echo: 4/23: EF of 60-65% with grade 1 DD Strict I/o Volume control per dialysis   Mixed hyperlipidemia Continue lipitor   History of anemia due to chronic kidney disease History of aranesp, will defer to nephrology since hgb <10  Also hx of low folate, will recheck this   localized maulopapular rash of abdomen  Triamcinolone BID x 7 days     Advance Care Planning:   Code Status: Full Code   Consults: nephrology: Dr. Carolin Sicks   DVT Prophylaxis: heparin   Family Communication: none   Severity of Illness: The appropriate patient status for this patient is OBSERVATION. Observation status is judged to be reasonable and necessary in order to provide the required intensity of service to ensure the patient's safety. The patient's presenting symptoms, physical exam findings, and initial radiographic and laboratory data in the context of their medical condition is felt to place them at decreased risk for further clinical deterioration. Furthermore, it is anticipated that the patient will be medically stable for discharge from the hospital within 2 midnights of admission.   Author: Orma Flaming, MD 09/25/2021 2:40 PM  For on call review [email or web address].

## 2021-09-25 NOTE — Assessment & Plan Note (Signed)
Hx of NSTEMI in 03/2020 Cardiac catheterization revealed a 99% proximal LAD with right to left collaterals, 70% mid LAD, 40% distal LAD.  Patient had PCI of the LAD with 2 drug-eluting stents. Patient maintained on medical management with home regimen of ASA, brilinta and statin, beta-blocker, ARB. -Outpatient follow-up with cardiology as previously scheduled.

## 2021-09-25 NOTE — ED Notes (Signed)
Unable to get IV multiple nurses have tried. IV team consult notified and MD aware

## 2021-09-25 NOTE — Consult Note (Signed)
[facility] Renal Consultation Note  Indication for Consultation:  Management of ESRD/hemodialysis; anemia, hypertension/volume and secondary hyperparathyroidism  HPI: Stacie Greene is a 46 y.o. female with ho ESRD , HTN, CAD status post stenting 2021 , HD TTS last HD 5/30 (questionable history of compliance in the past) recent PD insert 09/03/2021, was training for PD and thought this was good enough for hemodialysis but was not fully released from hemo yet.  Now presents to the ER after several days of nausea vomiting increasing symptoms.  States not able to keep her BP meds down  In ER BP 211/121, NA 142 BUN 53 CR 24.6 K5.7, CO2 21 WBC 11.1 Hgb 9.9, PLT 377 CXR = pulmonary vascular congestion very mild interstitial edema trace bilateral pleural effusions.  Seen in ER on stretcher, states just feels nauseous, no shortness of breath chest pain or abdominal pain.  No fever chills cough.  When I called home training PD RN stated the last PD fluid flushing 2 days ago clear without problems and patient was actually training and not performing actual PD.  We are consulted for ESRD dialysis issues.  Patient appears uremic missing dialysis plan for dialysis today     Past Medical History:  Diagnosis Date   Acute on chronic kidney failure ([place]) 06/01/2019   Chronic kidney disease 07/12/2019   Coronary artery disease    Cyst of ovary 01/24/2020   ESRD due to hypertension ([place])    Heart attack ([place]) 2021   Hematuria 01/24/2020   Herpes    Hypertension secondary to other renal disorders 01/24/2020    Past Surgical History:  Procedure Laterality Date   APPENDECTOMY     CAPD INSERTION N/A 09/03/2021   Procedure: LAPAROSCOPIC INSERTION CONTINUOUS AMBULATORY PERITONEAL DIALYSIS  (CAPD) CATHETER;  Surgeon: Cherre Robins, MD;  Location: [place];  Service: Vascular;  Laterality: N/A;   COLONOSCOPY WITH PROPOFOL N/A 10/18/2020   Procedure: COLONOSCOPY WITH PROPOFOL;  Surgeon: Jackquline Denmark, MD;  Location: [place];  Service: Gastroenterology;  Laterality: N/A;   COLONOSCOPY WITH PROPOFOL N/A 10/19/2020   Procedure: COLONOSCOPY WITH PROPOFOL;  Surgeon: Mauri Pole, MD;  Location: [facility];  Service: Endoscopy;  Laterality: N/A;   CORONARY STENT INTERVENTION N/A 03/29/2020   Procedure: CORONARY STENT INTERVENTION;  Surgeon: Burnell Blanks, MD;  Location: [facility];  Service: Cardiovascular;  Laterality: N/A;   ESOPHAGOGASTRODUODENOSCOPY (EGD) WITH PROPOFOL N/A 10/18/2020   Procedure: ESOPHAGOGASTRODUODENOSCOPY (EGD) WITH PROPOFOL;  Surgeon: Jackquline Denmark, MD;  Location: [facility];  Service: Gastroenterology;  Laterality: N/A;   HOT HEMOSTASIS N/A 10/18/2020   Procedure: HOT HEMOSTASIS (ARGON PLASMA COAGULATION/BICAP);  Surgeon: Jackquline Denmark, MD;  Location: [facility];  Service: Gastroenterology;  Laterality: N/A;   HYSTEROPLASTY     INTRAVASCULAR ULTRASOUND/IVUS N/A 03/29/2020   Procedure: Intravascular Ultrasound/IVUS;  Surgeon: Burnell Blanks, MD;  Location: [facility];  Service: Cardiovascular;  Laterality: N/A;   LEFT HEART CATH AND CORONARY ANGIOGRAPHY N/A 03/29/2020   Procedure: LEFT HEART CATH AND CORONARY ANGIOGRAPHY;  Surgeon: Burnell Blanks, MD;  Location: [facility];  Service: Cardiovascular;  Laterality: N/A;   THYROID SURGERY       No family history on file.    reports that she quit smoking about 17 months ago. Her smoking use included cigarettes. She has never used smokeless tobacco. She reports that she does not currently use alcohol. She reports that she does not currently use drugs.   Allergies  Allergen Reactions   2-Octylcyanoacrylate [Cyanoacrylate] Other (See Comments)    Blistering from Dermab[place], see photo in the chart.     Prior to Admission medications   Medication Sig Start Date End Date Taking? Authorizing Provider  acetaminophen (TYLENOL) 500 MG tablet Take 1,000 mg  by mouth every 6 (six) hours as needed.    [provider]  amLODipine (NORVASC) 5 MG tablet Take 1 tablet (5 mg total) by mouth daily. 07/30/21   Danford, Suann Larry, MD  aspirin EC 81 MG tablet Take 81 mg by mouth daily. Swallow whole.    [provider]  atorvastatin (LIPITOR) 40 MG tablet Take 1 tablet (40 mg total) by mouth daily at 6 PM. Schedule an appointment with cardiology for further refills, 1st attempt 07/29/21   Danford, Suann Larry, MD  carvedilol (COREG) 25 MG tablet Take 1 tablet (25 mg total) by mouth 2 (two) times daily with a meal. 07/29/21   Danford, Suann Larry, MD  famotidine (PEPCID) 20 MG tablet Take 1 tablet (20 mg total) by mouth 2 (two) times daily. 09/11/21   Carmin Muskrat, MD  hydrALAZINE (APRESOLINE) 25 MG tablet Take 1 tablet (25 mg total) by mouth every 8 (eight) hours. 07/29/21   Danford, Suann Larry, MD  losartan (COZAAR) 50 MG tablet Take 1 tablet (50 mg total) by mouth daily. 07/29/21   Danford, Suann Larry, MD  metoCLOPramide (REGLAN) 10 MG tablet Take 1 tablet (10 mg total) by mouth 3 (three) times daily before meals for 7 days. 09/11/21 09/18/21  Carmin Muskrat, MD  oxyCODONE-acetaminophen (PERCOCET) 5-325 MG tablet Take 1 tablet by mouth every 6 (six) hours as needed for severe pain. 09/03/21   Rhyne, Hulen Shouts, PA-C  sevelamer carbonate (RENVELA) 800 MG tablet Take 2 tablets (1,600 mg total) by mouth 3 (three) times daily with meals. 07/29/21   Danford, Suann Larry, MD  sucralfate (CARAFATE) 1 g tablet Take 1 tablet (1 g total) by mouth 4 (four) times daily -  with meals and at bedtime. 09/11/21   Carmin Muskrat, MD  ticagrelor (BRILINTA) 90 MG TABS tablet Take 1 tablet (90 mg total) by mouth 2 (two) times daily. 04/10/20   Erlene Quan, PA-C      Results for orders placed or performed during the hospital encounter of 09/25/21 (from the past 48 hour(s))  Comprehensive metabolic panel     Status: Abnormal   Collection Time:  09/25/21  9:15 AM  Result Value Ref Range   Sodium 142 135 - 145 mmol/L   Potassium 5.7 (H) 3.5 - 5.1 mmol/L   Chloride 101 98 - 111 mmol/L   CO2 21 (L) 22 - 32 mmol/L   Glucose, Bld 95 70 - 99 mg/dL    Comment: Glucose reference range applies only to samples taken after fasting for at least 8 hours.   BUN 53 (H) 6 - 20 mg/dL   Creatinine, Ser 24.59 (H) 0.44 - 1.00 mg/dL   Calcium 9.1 8.9 - 10.3 mg/dL   Total Protein 7.2 6.5 - 8.1 g/dL   Albumin 3.4 (L) 3.5 - 5.0 g/dL   AST 11 (L) 15 - 41 U/L   ALT 7 0 - 44 U/L   Alkaline Phosphatase 65 38 - 126 U/L   Total Bilirubin 0.6 0.3 - 1.2 mg/dL   GFR, Estimated 2 (L) >60 mL/min    Comment: (NOTE) Calculated using the CKD-EPI Creatinine Equation (2021)    Anion gap 20 (H) 5 - 15  Comment: Performed at [facility], [place] [address] [place], [place] [address]  CBC with Differential     Status: Abnormal   Collection Time: 09/25/21  9:15 AM  Result Value Ref Range   WBC 11.1 (H) 4.0 - 10.5 K/uL   RBC 3.96 3.87 - 5.11 MIL/uL   Hemoglobin 9.9 (L) 12.0 - 15.0 g/dL   HCT 32.6 (L) 36.0 - 46.0 %   MCV 82.3 80.0 - 100.0 fL   MCH 25.0 (L) 26.0 - 34.0 pg   MCHC 30.4 30.0 - 36.0 g/dL   RDW 17.6 (H) 11.5 - 15.5 %   Platelets 377 150 - 400 K/uL   nRBC 0.0 0.0 - 0.2 %   Neutrophils Relative % 73 %   Neutro Abs 8.1 (H) 1.7 - 7.7 K/uL   Lymphocytes Relative 15 %   Lymphs Abs 1.6 0.7 - 4.0 K/uL   Monocytes Relative 7 %   Monocytes Absolute 0.8 0.1 - 1.0 K/uL   Eosinophils Relative 4 %   Eosinophils Absolute 0.5 0.0 - 0.5 K/uL   Basophils Relative 1 %   Basophils Absolute 0.1 0.0 - 0.1 K/uL   Immature Granulocytes 0 %   Abs Immature Granulocytes 0.04 0.00 - 0.07 K/uL    Comment: Performed at [facility], [place] [address] [place], [place] [address]  Lipase, blood     Status: None   Collection Time: 09/25/21  9:15 AM  Result Value Ref Range   Lipase 28 11 - 51 U/L    Comment: Performed at [facility] [address],  [place], [place] [address]  Urinalysis, Routine w reflex microscopic     Status: Abnormal   Collection Time: 09/25/21  9:32 AM  Result Value Ref Range   Color, Urine YELLOW YELLOW   APPearance HAZY (A) CLEAR   Specific Gravity, Urine 1.011 1.005 - 1.030   pH 8.0 5.0 - 8.0   Glucose, UA 50 (A) NEGATIVE mg/dL   Hgb urine dipstick SMALL (A) NEGATIVE   Bilirubin Urine NEGATIVE NEGATIVE   Ketones, ur NEGATIVE NEGATIVE mg/dL   Protein, ur >=300 (A) NEGATIVE mg/dL   Nitrite NEGATIVE NEGATIVE   Leukocytes,Ua MODERATE (A) NEGATIVE   RBC / HPF 11-20 0 - 5 RBC/hpf   WBC, UA >50 (H) 0 - 5 WBC/hpf   Bacteria, UA NONE SEEN NONE SEEN   Squamous Epithelial / LPF 0-5 0 - 5    Comment: Performed at [facility], [place] [address], [place], [place] [address]  .  ROS: See HPI   Physical Exam: Vitals:   09/25/21 1315 09/25/21 1328  BP:  (!) 188/99  Pulse: 99 99  Resp: (!) 29 (!) 29  Temp:    SpO2: 97% 97%     General: Alert female NAD HEENT: , EOMI, nonicteric, MMM Neck: Supple, no JVD Heart: Regular tacky, no MRG Lungs: CTA, nonlabored breathing Abdomen: PD catheter lower quadrant dressing dry nontender, scattered maculopapular type rash abdomen Extremities: No pedal edema Skin: Maculopapular rash just on abdomen Neuro: Alert O x3 no asterixis no acute focal deficits appreciated Dialysis Access: Left forearm AV fistula positive bruit, PD catheter in abdomen  Dialysis Orders: Center: [facility]= TTS 4 hours EDW of 67.5 kg No heparin Mircera 150 mcg last given 5/30 Hectorol 4 mic  Assessment/Plan Uremia= secondary to missing HD treatments plan for hemodialysis today, discussed with patient not missing HD treatments while training for PD ESRD -HD TTS HD today  Volume overload= UF today on dialysis as able Hypertension/volume  -hypertensive secondary to missing HD and vomiting BP meds UF on HD and continue meds 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

## 2021-09-25 NOTE — Telephone Encounter (Signed)
Patient Name: Stacie Greene Gender: Female DOB: September 20, 1975 Age: 46 Y 33 M 18 D Return Phone Number: [phone] (Primary) Address: City/ State/ Zip: [place]  [address] Client [name] [place] Client Site [place] [place] Contact Type Call Who Is Calling Patient / Member / Family / Caregiver Call Type Triage / Clinical Caller Name [name] Relationship To Patient Self Return Phone Number [phone] (Primary) Chief Complaint Dizziness Reason for Call Symptomatic / Request for [name] states they are having dizziness for the past 2 days and vomting this morning. Patient sees [name], NP Translation No Disp. Time [name] Time) Disposition Final User 09/25/2021 8:35:29 AM Attempt made - no message left Deaton, RN, [name] 09/25/2021 8:50:28 AM Attempt made - no message left Deaton, RN, [name] 09/25/2021 9:04:39 AM FINAL ATTEMPT MADE - no message left Yes Deaton, RN, [name] Comments User: Saverio Danker, RN Date/Time [name] Time): 09/25/2021 8:35:49 AM mailbox full , unable to leave a voice mail.

## 2021-09-25 NOTE — ED Notes (Signed)
IV team at bedside 

## 2021-09-25 NOTE — Progress Notes (Deleted)
Patient arrived from ED to receive HD treatment in L-AVF. Complaints of "throbbing and achy" pains in lower legs. 10/10 pain noted. Patient is resting with eyes closed, breathing unlabored. Easily aroused. This nurse advised the need to monitor her BP closely as tx is initiated. Will monitor and provide appropriate medication once she is receiving tx. Patient agreed.

## 2021-09-26 DIAGNOSIS — Z7982 Long term (current) use of aspirin: Secondary | ICD-10-CM | POA: Diagnosis not present

## 2021-09-26 DIAGNOSIS — R21 Rash and other nonspecific skin eruption: Secondary | ICD-10-CM | POA: Diagnosis present

## 2021-09-26 DIAGNOSIS — Z992 Dependence on renal dialysis: Secondary | ICD-10-CM

## 2021-09-26 DIAGNOSIS — I132 Hypertensive heart and chronic kidney disease with heart failure and with stage 5 chronic kidney disease, or end stage renal disease: Secondary | ICD-10-CM | POA: Diagnosis present

## 2021-09-26 DIAGNOSIS — N189 Chronic kidney disease, unspecified: Secondary | ICD-10-CM

## 2021-09-26 DIAGNOSIS — E875 Hyperkalemia: Secondary | ICD-10-CM

## 2021-09-26 DIAGNOSIS — D631 Anemia in chronic kidney disease: Secondary | ICD-10-CM | POA: Diagnosis present

## 2021-09-26 DIAGNOSIS — N186 End stage renal disease: Secondary | ICD-10-CM | POA: Diagnosis present

## 2021-09-26 DIAGNOSIS — E782 Mixed hyperlipidemia: Secondary | ICD-10-CM

## 2021-09-26 DIAGNOSIS — I160 Hypertensive urgency: Secondary | ICD-10-CM | POA: Diagnosis present

## 2021-09-26 DIAGNOSIS — R112 Nausea with vomiting, unspecified: Secondary | ICD-10-CM | POA: Diagnosis present

## 2021-09-26 DIAGNOSIS — Z91158 Patient's noncompliance with renal dialysis for other reason: Secondary | ICD-10-CM | POA: Diagnosis not present

## 2021-09-26 DIAGNOSIS — I2510 Atherosclerotic heart disease of native coronary artery without angina pectoris: Secondary | ICD-10-CM | POA: Diagnosis present

## 2021-09-26 DIAGNOSIS — Z888 Allergy status to other drugs, medicaments and biological substances status: Secondary | ICD-10-CM | POA: Diagnosis not present

## 2021-09-26 DIAGNOSIS — I5032 Chronic diastolic (congestive) heart failure: Secondary | ICD-10-CM

## 2021-09-26 DIAGNOSIS — Z955 Presence of coronary angioplasty implant and graft: Secondary | ICD-10-CM | POA: Diagnosis not present

## 2021-09-26 DIAGNOSIS — Z862 Personal history of diseases of the blood and blood-forming organs and certain disorders involving the immune mechanism: Secondary | ICD-10-CM

## 2021-09-26 DIAGNOSIS — Z7902 Long term (current) use of antithrombotics/antiplatelets: Secondary | ICD-10-CM | POA: Diagnosis not present

## 2021-09-26 DIAGNOSIS — N2581 Secondary hyperparathyroidism of renal origin: Secondary | ICD-10-CM | POA: Diagnosis present

## 2021-09-26 DIAGNOSIS — Z79899 Other long term (current) drug therapy: Secondary | ICD-10-CM | POA: Diagnosis not present

## 2021-09-26 DIAGNOSIS — Z87891 Personal history of nicotine dependence: Secondary | ICD-10-CM | POA: Diagnosis not present

## 2021-09-26 DIAGNOSIS — M898X9 Other specified disorders of bone, unspecified site: Secondary | ICD-10-CM | POA: Diagnosis present

## 2021-09-26 DIAGNOSIS — I252 Old myocardial infarction: Secondary | ICD-10-CM | POA: Diagnosis not present

## 2021-09-26 DIAGNOSIS — F419 Anxiety disorder, unspecified: Secondary | ICD-10-CM | POA: Diagnosis present

## 2021-09-26 LAB — URINE CULTURE

## 2021-09-26 LAB — CBC
HCT: 33 % — ABNORMAL LOW (ref 36.0–46.0)
Hemoglobin: 10.2 g/dL — ABNORMAL LOW (ref 12.0–15.0)
MCH: 25.1 pg — ABNORMAL LOW (ref 26.0–34.0)
MCHC: 30.9 g/dL (ref 30.0–36.0)
MCV: 81.1 fL (ref 80.0–100.0)
Platelets: 420 10*3/uL — ABNORMAL HIGH (ref 150–400)
RBC: 4.07 MIL/uL (ref 3.87–5.11)
RDW: 17.6 % — ABNORMAL HIGH (ref 11.5–15.5)
WBC: 9.5 10*3/uL (ref 4.0–10.5)
nRBC: 0 % (ref 0.0–0.2)

## 2021-09-26 LAB — RENAL FUNCTION PANEL
Albumin: 3.5 g/dL (ref 3.5–5.0)
Anion gap: 12 (ref 5–15)
BUN: 24 mg/dL — ABNORMAL HIGH (ref 6–20)
CO2: 28 mmol/L (ref 22–32)
Calcium: 8.9 mg/dL (ref 8.9–10.3)
Chloride: 94 mmol/L — ABNORMAL LOW (ref 98–111)
Creatinine, Ser: 13.71 mg/dL — ABNORMAL HIGH (ref 0.44–1.00)
GFR, Estimated: 3 mL/min — ABNORMAL LOW (ref >60)
Glucose, Bld: 108 mg/dL — ABNORMAL HIGH (ref 70–99)
Phosphorus: 7.9 mg/dL — ABNORMAL HIGH (ref 2.5–4.6)
Potassium: 5.2 mmol/L — ABNORMAL HIGH (ref 3.5–5.1)
Sodium: 134 mmol/L — ABNORMAL LOW (ref 135–145)

## 2021-09-26 LAB — HEPATITIS B SURFACE ANTIBODY, QUANTITATIVE: Hep B S AB Quant (Post): 36.9 m[IU]/mL (ref >9.9)

## 2021-09-26 MED ORDER — PANTOPRAZOLE SODIUM 40 MG IV SOLR
40.0000 mg | INTRAVENOUS | Status: DC
  Administered 2021-09-26 – 2021-09-27 (×2): 40 mg via INTRAVENOUS
  Filled 2021-09-26 (×2): qty 10

## 2021-09-26 MED ORDER — NEPRO/CARBSTEADY PO LIQD
237.0000 mL | Freq: Three times a day (TID) | ORAL | Status: DC | PRN
Start: 2021-09-26 — End: 2021-09-28

## 2021-09-26 MED ORDER — LOSARTAN POTASSIUM 50 MG PO TABS
50.0000 mg | ORAL_TABLET | Freq: Every day | ORAL | Status: DC
  Administered 2021-09-26 – 2021-09-27 (×2): 50 mg via ORAL
  Filled 2021-09-26 (×2): qty 1

## 2021-09-26 MED ORDER — OXYCODONE-ACETAMINOPHEN 5-325 MG PO TABS
1.0000 | ORAL_TABLET | Freq: Once | ORAL | Status: AC
  Administered 2021-09-26: 1 via ORAL
  Filled 2021-09-26: qty 1

## 2021-09-26 MED ORDER — TICAGRELOR 90 MG PO TABS
90.0000 mg | ORAL_TABLET | Freq: Two times a day (BID) | ORAL | Status: DC
  Administered 2021-09-26 – 2021-09-27 (×3): 90 mg via ORAL
  Filled 2021-09-26 (×4): qty 1

## 2021-09-26 MED ORDER — OXYCODONE-ACETAMINOPHEN 5-325 MG PO TABS
1.0000 | ORAL_TABLET | Freq: Four times a day (QID) | ORAL | Status: AC | PRN
  Administered 2021-09-26 – 2021-09-27 (×2): 1 via ORAL
  Filled 2021-09-26 (×2): qty 1

## 2021-09-26 MED ORDER — ZOLPIDEM TARTRATE 5 MG PO TABS
5.0000 mg | ORAL_TABLET | Freq: Every evening | ORAL | Status: DC | PRN
  Filled 2021-09-26: qty 1

## 2021-09-26 MED ORDER — SEVELAMER CARBONATE 800 MG PO TABS
1600.0000 mg | ORAL_TABLET | Freq: Three times a day (TID) | ORAL | Status: DC
  Administered 2021-09-26 – 2021-09-27 (×4): 1600 mg via ORAL
  Filled 2021-09-26 (×5): qty 2

## 2021-09-26 MED ORDER — RENA-VITE PO TABS
1.0000 | ORAL_TABLET | Freq: Every day | ORAL | Status: DC
  Administered 2021-09-26: 1 via ORAL
  Filled 2021-09-26: qty 1

## 2021-09-26 MED ORDER — LORAZEPAM 0.5 MG PO TABS
0.2500 mg | ORAL_TABLET | Freq: Once | ORAL | Status: AC | PRN
  Administered 2021-09-26: 0.25 mg via ORAL
  Filled 2021-09-26: qty 1

## 2021-09-26 MED ORDER — ASPIRIN 81 MG PO TBEC
81.0000 mg | DELAYED_RELEASE_TABLET | Freq: Every day | ORAL | Status: DC
  Administered 2021-09-26 – 2021-09-27 (×2): 81 mg via ORAL
  Filled 2021-09-26 (×2): qty 1

## 2021-09-26 MED ORDER — ATORVASTATIN CALCIUM 40 MG PO TABS
40.0000 mg | ORAL_TABLET | Freq: Every day | ORAL | Status: DC
  Administered 2021-09-26 – 2021-09-27 (×2): 40 mg via ORAL
  Filled 2021-09-26 (×2): qty 1

## 2021-09-26 MED ORDER — HYDRALAZINE HCL 20 MG/ML IJ SOLN
10.0000 mg | Freq: Three times a day (TID) | INTRAMUSCULAR | Status: DC | PRN
Start: 2021-09-26 — End: 2021-09-28

## 2021-09-26 MED ORDER — CARVEDILOL 25 MG PO TABS
25.0000 mg | ORAL_TABLET | Freq: Two times a day (BID) | ORAL | Status: DC
  Administered 2021-09-26 – 2021-09-27 (×4): 25 mg via ORAL
  Filled 2021-09-26 (×4): qty 1

## 2021-09-26 MED ORDER — CALCIUM CARBONATE ANTACID 1250 MG/5ML PO SUSP: 500.0000 mg | Freq: Four times a day (QID) | ORAL | Status: DC | PRN

## 2021-09-26 MED ORDER — HYDROXYZINE HCL 25 MG PO TABS
25.0000 mg | ORAL_TABLET | Freq: Three times a day (TID) | ORAL | Status: DC | PRN
  Administered 2021-09-26: 25 mg via ORAL
  Filled 2021-09-26: qty 1

## 2021-09-26 MED ORDER — CAMPHOR-MENTHOL 0.5-0.5 % EX LOTN: 1.0000 "application " | TOPICAL_LOTION | Freq: Three times a day (TID) | CUTANEOUS | Status: DC | PRN

## 2021-09-26 MED ORDER — DOCUSATE SODIUM 283 MG RE ENEM: 1.0000 | ENEMA | RECTAL | Status: DC | PRN

## 2021-09-26 MED ORDER — SORBITOL 70 % SOLN: 30.0000 mL | Status: DC | PRN

## 2021-09-26 MED ORDER — HYDRALAZINE HCL 25 MG PO TABS
25.0000 mg | ORAL_TABLET | Freq: Three times a day (TID) | ORAL | Status: DC
  Administered 2021-09-26 – 2021-09-27 (×5): 25 mg via ORAL
  Filled 2021-09-26 (×5): qty 1

## 2021-09-26 MED ORDER — AMLODIPINE BESYLATE 5 MG PO TABS
5.0000 mg | ORAL_TABLET | Freq: Every day | ORAL | Status: DC
  Administered 2021-09-26: 5 mg via ORAL
  Filled 2021-09-26: qty 1

## 2021-09-26 NOTE — Final Progress Note (Incomplete)
Received patient in bed, alert and oriented. Informed consent signed and in chart.  Time tx initiated:  Pre HD weight:  Pre HD VS:  Time tx completed:  HD treatment completed. Patient tolerated well. catheter without signs and symptoms of complications. Patient transported back to the room, alert and oriented and in no acute distress. Report RN.  Total UF removed:  Medication given:  Post HD VS:  Post HD weight:

## 2021-09-26 NOTE — Progress Notes (Signed)
PROGRESS NOTE    Stacie Greene  LNL:[id number] DOB: 1975/08/04 DOA: 09/25/2021 PCP: [name], NP    Chief Complaint  Patient presents with   Vomiting    Brief Narrative:  Patient 46 year old female with history of end-stage renal disease on dialysis, hypertension, CAD with DES to the LAD, hyperlipidemia, grade 1 diastolic dysfunction, history of recurrent HSV history of recent transition from HD to PD currently undergoing PD training, presenting to the ED with nausea, vomiting, hypertensive urgency consistent with uremia.  Patient seen by nephrology underwent hemodialysis on presentation and undergoing another session of hemodialysis 09/26/2021.    Assessment & Plan:  Principal Problem:   Intractable nausea and vomiting Active Problems:   Hyperkalemia   ESRD (end stage renal disease) on dialysis [facility])   Essential hypertension   Coronary artery disease involving native coronary artery of native heart without angina pectoris   Chronic diastolic CHF (congestive heart failure) (HCC)   Mixed hyperlipidemia   localized maulopapular rash of abdomen    History of anemia due to chronic kidney disease   ESRD on hemodialysis (HCC)   Hypertensive urgency   Nausea and vomiting    Assessment and Plan: * Intractable nausea and vomiting 46 year old female with 2 week history of intractable nausea with some vomiting that improved s/p medication; however, over past 2 days she had recurrence of nausea with emesis and elevated blood pressure that resulted in her missing 2 sessions of her PD.  -more intractable nausea with one episode of vomiting/daily. Not associated with food/abdominal pain/BM changes. No pain on exam  Differential includes PUD, uremia, HTN, idiopathic gastroparesis. Normal lipase and transaminases. Most likely related to her uremia.  -Patient with some clinical improvement after first hemodialysis session. -Patient seen by nephrology and underwent hemodialysis on day of  admission 09/25/2021 and patient for another round of hemodialysis today 09/26/2021 dialysis planned for today. -Patient changed from Pepcid to Protonix and on Zofran as needed. -has GI appointment scheduled 6/22.  -UA with abnormal urine; however, she has no  Urinary symptoms. Given rocephin in Ed. Will hold on further abx since asymptomatic until culture results.  -Nephrology following and appreciate input and recommendations.  Hyperkalemia -secondary to missed dialysis Medically managed with [employer] Nephrology consulted and patient underwent hemodialysis on 09/25/2021 and patient for another session of hemodialysis today 09/26/2021.  -Potassium currently at 5.2 today.  Keep on telemetry. -Repeat labs in the a.m.  ESRD (end stage renal disease) on dialysis [facility]) Recently transitioned from HD to PD this past Monday, currently in training and has only been doing PD. Missed 2 days of PD. AG of 20 with creatinine of 24.59 on presentation.  Nephrology has been consulted. Will likely need more than one HD session to get her back on track.  Potassium elevated, given lokelma. -Patient underwent hemodialysis 09/25/2021 and due for hemodialysis today 09/26/2021.  -Per nephrology.    Essential hypertension HTN urgency on arrival with bp 211/121. She states her blood pressure has been elevated over the past few weeks which could also be contributing to her N/V. No other symptoms.  Received labetalol IV in ED with improvement.  -Continue home regimen of Norvasc 5 mg daily, hydralazine 25 mg 3 times daily, Coreg 25 mg twice daily, Cozaar 50 mg daily. -IV hydralazine as needed. -Patient also on hemodialysis.  Coronary artery disease involving native coronary artery of native heart without angina pectoris Hx of NSTEMI in 03/2020 Cardiac catheterization revealed a 99% proximal LAD with right to  left collaterals, 70% mid LAD, 40% distal LAD.  Patient had PCI of the LAD with 2 drug-eluting stents. Continue medical  management with ASA, brilinta and statin   Chronic diastolic CHF (congestive heart failure) (HCC) Appears euvolemic on exam  CXR with interstitial edema, not requiring any oxygen  Given 500cc bolus in ED Last echo: 4/23: EF of 60-65% with grade 1 DD Strict I/o Volume control via HD.  Mixed hyperlipidemia Continue lipitor   History of anemia due to chronic kidney disease Secondary to anemia of chronic disease.   -History of aranesp Folate at 6.0.   -Per nephrology.  localized maulopapular rash of abdomen  Triamcinolone BID x 7 days          DVT prophylaxis: Heparin Code Status: Full Family Communication: Updated patient and mother at bedside. Disposition: Home when clinically improved and cleared by nephrology.  Status is: Inpatient Remains inpatient appropriate because: Severity of illness   Consultants:  Nephrology: Dr. Lucita Ferrara 09/25/2021  Procedures:  Chest x-ray 09/25/2021   Antimicrobials:  None   Subjective: Patient noted to have nausea and emesis overnight.  Improvement with nausea and emesis after first hemodialysis on admission.  Patient scheduled for hemodialysis today.  No chest pain.  No shortness of breath.  Mother at bedside.  Objective: Vitals:   09/26/21 1558 09/26/21 1630 09/26/21 1706 09/26/21 1802  BP: (!) 183/86 (!) 187/96 (!) 165/99 (!) 164/100  Pulse: 92 94 99 (!) 107  Resp: 19 17 17 18   Temp:    98.5 F (36.9 C)  TempSrc:    Oral  SpO2: 97% 99% 97% 99%  Weight:      Height:        Intake/Output Summary (Last 24 hours) at 09/26/2021 2034 Last data filed at 09/26/2021 0225 Gross per 24 hour  Intake --  Output -66.1 ml  Net 66.1 ml   Filed Weights   09/25/21 1608 09/25/21 2244  Weight: 68.5 kg 67.7 kg    Examination:  General exam: Appears calm and comfortable  Respiratory system: Clear to auscultation.  No wheezes, no crackles, no rhonchi.  Respiratory effort normal. Cardiovascular system: S1 & S2 heard, RRR. No JVD, murmurs,  rubs, gallops or clicks. No pedal edema. Gastrointestinal system: Abdomen is nondistended, soft and nontender.  PD catheter noted.  No organomegaly or masses felt. Normal bowel sounds heard. Central nervous system: Alert and oriented. No focal neurological deficits. Extremities: Symmetric 5 x 5 power. Skin: No rashes, lesions or ulcers Psychiatry: Judgement and insight appear normal. Mood & affect appropriate.     Data Reviewed:   CBC: Recent Labs  Lab 09/25/21 0915 09/25/21 1618 09/26/21 0836  WBC 11.1* 10.9* 9.5  NEUTROABS 8.1*  --   --   HGB 9.9* 9.3* 10.2*  HCT 32.6* 30.3* 33.0*  MCV 82.3 82.1 81.1  PLT 377 375 420*    Basic Metabolic Panel: Recent Labs  Lab 09/25/21 0915 09/25/21 2051 09/26/21 0836  NA 142 137 134*  K 5.7* 3.4* 5.2*  CL 101 97* 94*  CO2 21* 30 28  GLUCOSE 95 86 108*  BUN 53* 13 24*  CREATININE 24.59* 8.01* 13.71*  CALCIUM 9.1 8.5* 8.9  PHOS  --  3.7 7.9*    GFR: Estimated Creatinine Clearance: 4.9 mL/min (A) (by C-G formula based on SCr of 13.71 mg/dL (H)).  Liver Function Tests: Recent Labs  Lab 09/25/21 0915 09/25/21 2051 09/26/21 0836  AST 11*  --   --   ALT 7  --   --  ALKPHOS 65  --   --   BILITOT 0.6  --   --   PROT 7.2  --   --   ALBUMIN 3.4* 3.4* 3.5    CBG: No results for input(s): "GLUCAP" in the last 168 hours.   Recent Results (from the past 240 hour(s))  Urine Culture     Status: Abnormal   Collection Time: 09/25/21  4:41 PM   Specimen: Urine, Clean Catch  Result Value Ref Range Status   Specimen Description URINE, CLEAN CATCH  Final   Special Requests   Final    NONE Performed at [facility], [address], [place], [place] [address]    Culture MULTIPLE SPECIES PRESENT, SUGGEST RECOLLECTION (A)  Final   Report Status 09/26/2021 FINAL  Final         Radiology Studies: DG Chest Portable 1 View  Result Date: 09/25/2021 CLINICAL DATA:  Vomiting and shortness of breath. History of end-stage  renal disease. EXAM: PORTABLE CHEST 1 VIEW COMPARISON:  10/16/2020 chest radiograph FINDINGS: UPPER limits normal heart size and pulmonary vascular congestion noted. Minimal interstitial opacities are present likely representing very mild interstitial edema. Trace bilateral pleural effusions are noted. There is no evidence of pneumothorax. Surgical clips within the RIGHT neck are again noted. No acute bony abnormalities are present. IMPRESSION: UPPER limits normal heart size with pulmonary vascular congestion and probable very mild interstitial edema. Trace bilateral pleural effusions. Electronically Signed   By: Margarette Canada M.D.   On: 09/25/2021 14:04        Scheduled Meds:  amLODipine  5 mg Oral Daily   aspirin EC  81 mg Oral Daily   atorvastatin  40 mg Oral q1800   carvedilol  25 mg Oral BID WC   Chlorhexidine Gluconate Cloth  6 each Topical Q0600   feeding supplement (NEPRO CARB STEADY)  237 mL Oral BID BM   heparin  5,000 Units Subcutaneous Q8H   hydrALAZINE  25 mg Oral Q8H   losartan  50 mg Oral Daily   multivitamin  1 tablet Oral QHS   pantoprazole (PROTONIX) IV  40 mg Intravenous Q24H   sevelamer carbonate  1,600 mg Oral TID WC   sodium chloride flush  3 mL Intravenous Q12H   ticagrelor  90 mg Oral BID   triamcinolone cream   Topical BID   Continuous Infusions:  sodium chloride       LOS: 0 days    Time spent: 40 minutes    Irine Seal, MD [facility]   To contact the attending provider between 7A-7P or the covering provider during after hours 7P-7A, please log into the web site [email or web address] and access using universal [place] password for that web site. If you do not have the password, please call the hospital operator.  09/26/2021, 8:34 PM

## 2021-09-26 NOTE — Hospital Course (Signed)
Patient 46 year old female with history of end-stage renal disease on dialysis, hypertension, CAD with DES to the LAD, hyperlipidemia, grade 1 diastolic dysfunction, history of recurrent HSV history of recent transition from HD to PD currently undergoing PD training, presenting to the ED with nausea, vomiting, hypertensive urgency consistent with uremia.  Patient seen by nephrology underwent hemodialysis on presentation and undergoing another session of hemodialysis 09/26/2021.

## 2021-09-26 NOTE — Progress Notes (Signed)
Received patient in bed, alert and oriented. Informed consent signed and in chart.  Time tx initiated:15:19  Pre HD weight:101.7  Pre HD VS:11/85,71,100%, 98.1  Time tx completed:  HD treatment completed. Patient tolerated well. Fistula/Graft/HD catheter without signs and symptoms of complications. Patient transported back to the room, alert and orient and in no acute distress. Report given to bedside RN. Pt signed to end tx early. 1:42 min remaining on tx. Total UF removed:100  Medication given:Zofran  Post HD VS:165/99  Post HD weight: 63.7

## 2021-09-26 NOTE — Telephone Encounter (Signed)
Pt states she went to the ED and they admitted her. Pt stated she will keep [place] posted.

## 2021-09-26 NOTE — Progress Notes (Signed)
Initial Nutrition Assessment  DOCUMENTATION CODES:   Not applicable  INTERVENTION:  Liberalize diet from a renal to a 2 gram sodium diet to provide widest variety of menu options to enhance nutritional adequacy Nepro Shake po BID, each supplement provides 425 kcal and 19 grams protein Renal MVI with minerals daily  NUTRITION DIAGNOSIS:   Inadequate oral intake related to poor appetite, vomiting, nausea as evidenced by per patient/family report.  GOAL:   Patient will meet greater than or equal to 90% of their needs  MONITOR:   PO intake, Diet advancement, Labs, Supplement acceptance, Weight trends, I & O's  REASON FOR ASSESSMENT:   Malnutrition Screening Tool    ASSESSMENT:   Pt admitted with intractable n/v. PMH significant for ESRD on HD, CAD with DES to LAD, HLD, grade 1 diastolic CHF, recurrent HSV.  Noted pt was training to transition from HD to PD, however missed a couple HD sessions. Plans for HD during admission and continuing until fully transitioned to HD.   Briefly spoke with pt at bedside prior to HD session today. She states that she has had very poor PO intake within the last month as she has had ongoing, intermittent nausea with vomiting. She was seen 2 weeks ago in the ED and was given antiemetic which she states helped only a little bit. Pt reports that the few bites she has been able to take, she has been unable to keep down. She has tried Nepro shakes before and does not prefer them but can tolerate them and agrees to continue to receive them during admission.  Reviewed meals in [facility]. Pt did not receive a meal for dinner yesterday, or breakfast and lunch today. Suspect elevated electrolytes are d/t missed HD sessions. Pt would likely benefit from liberalized diet given inadequate intake within the last month.   Pt did not provide wt hx. Per review of chart, it appears he wt has been slowly trending down ~1kg within the last year. No significant wt loss  noted. Will continue to monitor throughout admission.  EDW 67.5 kg Current wt: 67.7 kg  06/07: Post-HD net UF: 1.9L  Medications: protonix, renvela  Labs: sodium 134, potassium 5.2, BUN 24, Cr 12.71, phos 7.9  NUTRITION - FOCUSED PHYSICAL EXAM: Unable to perform as pt leaving the room for HD.   Diet Order:   Diet Order             Diet 2 gram sodium Room service appropriate? Yes; Fluid consistency: Thin; Fluid restriction: 1200 mL Fluid  Diet effective now                   EDUCATION NEEDS:   Education needs have been addressed  Skin:  Skin Assessment: Reviewed RN Assessment  Last BM:  PTA  Height:   Ht Readings from Last 1 Encounters:  09/25/21 5\' 6"  (1.676 m)    Weight:   Wt Readings from Last 1 Encounters:  09/25/21 67.7 kg   BMI:  Body mass index is 24.09 kg/m.  Estimated Nutritional Needs:   Kcal:  1800-2000  Protein:  90-105g  Fluid:  1L + UOP  Clayborne Dana, RDN, LDN Clinical Nutrition

## 2021-09-26 NOTE — Progress Notes (Addendum)
[facility] Progress Note   Subjective:    Seen and examined patient at bedside. Noted patient experiencing anxiety attack overnight. Current sitting at bedside feeling nauseous and vomiting (clear sputum). PRN Zofran given. Tolerated yesterday's HD with net UF 1.9L. Plan for HD again today as she still showing signs for uremia.  Objective Vitals:   09/26/21 0146 09/26/21 0417 09/26/21 0514 09/26/21 0809  BP: (!) 194/122 (!) 163/89 (!) 167/101 (!) 181/113  Pulse: (!) 101 (!) 108 100 (!) 110  Resp:  18 18 18   Temp: 98 F (36.7 C) (!) 97.5 F (36.4 C) 97.7 F (36.5 C) 98.5 F (36.9 C)  TempSrc: Oral Oral Oral Oral  SpO2: 95% 97% 97% 98%  Weight:      Height:       Physical Exam General: Nauseous and vomiting; NAD Heart: S1 and S2; No murmurs, gallops, or rubs Lungs: Clear throughout; No wheezing, rales, or rhonchi Abdomen: Soft and non-tender Extremities: No edema BLLE Dialysis Access: L AVF (+) B/T; PD catheter LUQ   Filed Weights   09/25/21 1608 09/25/21 2244  Weight: 68.5 kg 67.7 kg    Intake/Output Summary (Last 24 hours) at 09/26/2021 0909 Last data filed at 09/26/2021 0225 Gross per 24 hour  Intake 600 ml  Output -66.1 ml  Net 666.1 ml    Additional Objective Labs: Basic Metabolic Panel: Recent Labs  Lab 09/25/21 0915 09/25/21 2051  NA 142 137  K 5.7* 3.4*  CL 101 97*  CO2 21* 30  GLUCOSE 95 86  BUN 53* 13  CREATININE 24.59* 8.01*  CALCIUM 9.1 8.5*  PHOS  --  3.7   Liver Function Tests: Recent Labs  Lab 09/25/21 0915 09/25/21 2051  AST 11*  --   ALT 7  --   ALKPHOS 65  --   BILITOT 0.6  --   PROT 7.2  --   ALBUMIN 3.4* 3.4*   Recent Labs  Lab 09/25/21 0915  LIPASE 28   CBC: Recent Labs  Lab 09/25/21 0915 09/25/21 1618  WBC 11.1* 10.9*  NEUTROABS 8.1*  --   HGB 9.9* 9.3*  HCT 32.6* 30.3*  MCV 82.3 82.1  PLT 377 375   Blood Culture No results found for: "SDES", "SPECREQUEST", "CULT", "REPTSTATUS"  Cardiac  Enzymes: No results for input(s): "CKTOTAL", "CKMB", "CKMBINDEX", "TROPONINI" in the last 168 hours. CBG: No results for input(s): "GLUCAP" in the last 168 hours. Iron Studies: No results for input(s): "IRON", "TIBC", "TRANSFERRIN", "FERRITIN" in the last 72 hours. No results found for: "INR", "PROTIME" Studies/Results: DG Chest Portable 1 View  Result Date: 09/25/2021 CLINICAL DATA:  Vomiting and shortness of breath. History of end-stage renal disease. EXAM: PORTABLE CHEST 1 VIEW COMPARISON:  10/16/2020 chest radiograph FINDINGS: UPPER limits normal heart size and pulmonary vascular congestion noted. Minimal interstitial opacities are present likely representing very mild interstitial edema. Trace bilateral pleural effusions are noted. There is no evidence of pneumothorax. Surgical clips within the RIGHT neck are again noted. No acute bony abnormalities are present. IMPRESSION: UPPER limits normal heart size with pulmonary vascular congestion and probable very mild interstitial edema. Trace bilateral pleural effusions. Electronically Signed   By: Margarette Canada M.D.   On: 09/25/2021 14:04    Medications:  sodium chloride      amLODipine  5 mg Oral Daily   aspirin EC  81 mg Oral Daily   atorvastatin  40 mg Oral q1800   carvedilol  25 mg Oral BID WC  Chlorhexidine Gluconate Cloth  6 each Topical Q0600   feeding supplement (NEPRO CARB STEADY)  237 mL Oral BID BM   heparin  5,000 Units Subcutaneous Q8H   hydrALAZINE  25 mg Oral Q8H   losartan  50 mg Oral Daily   pantoprazole (PROTONIX) IV  40 mg Intravenous Q24H   sevelamer carbonate  1,600 mg Oral TID WC   sodium chloride flush  3 mL Intravenous Q12H   ticagrelor  90 mg Oral BID   triamcinolone cream   Topical BID    Dialysis Orders: Dialysis Orders: Center: [facility]= TTS 4 hours EDW of 67.5 kg No heparin Mircera 150 mcg last given 5/30 Hectorol 4 mcg *Currently undergoing PD training*  Assessment/Plan: Uremia= secondary to missing HD  treatments. Received HD yesterday. Still c/o N/V. Discussed with patient not missing HD treatments while training for PD ESRD -HD TTS. Received HD yesterday with net UF 1.9L. Pt still with nausea/vomiting. Plan to dialyze her again today as she is still showing signs for uremia. Current K+ 3.4. Hypertension/volume  -hypertensive secondary to missing HD, still with N/V. Continue BP meds UF as tolerated. 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 Continue TUMS and binders. Nutrition -renal diet// renal vitamin  Tobie Poet, NP [facility] 09/26/2021,9:09 AM  LOS: 0 days

## 2021-09-26 NOTE — Progress Notes (Signed)
Patient had an anxiety attack last night. She was complaining of severe headache. She also complained of passing out after coming out of the bathroom and she also had a large vomit. When assessed BP was very high, PRN med given. MD notified notified and new order given, see MAR. Patient stable and sleeping. We continue to monitor.

## 2021-09-26 NOTE — Telephone Encounter (Signed)
Please call and check on Stacie Greene - dizziness and vomiting - needs an office visit?

## 2021-09-27 DIAGNOSIS — I5032 Chronic diastolic (congestive) heart failure: Secondary | ICD-10-CM | POA: Diagnosis not present

## 2021-09-27 DIAGNOSIS — N186 End stage renal disease: Secondary | ICD-10-CM | POA: Diagnosis not present

## 2021-09-27 DIAGNOSIS — R112 Nausea with vomiting, unspecified: Secondary | ICD-10-CM | POA: Diagnosis not present

## 2021-09-27 DIAGNOSIS — I2510 Atherosclerotic heart disease of native coronary artery without angina pectoris: Secondary | ICD-10-CM | POA: Diagnosis not present

## 2021-09-27 DIAGNOSIS — N19 Unspecified kidney failure: Secondary | ICD-10-CM | POA: Diagnosis present

## 2021-09-27 DIAGNOSIS — I10 Essential (primary) hypertension: Secondary | ICD-10-CM

## 2021-09-27 LAB — CBC
HCT: 31.3 % — ABNORMAL LOW (ref 36.0–46.0)
Hemoglobin: 9.4 g/dL — ABNORMAL LOW (ref 12.0–15.0)
MCH: 24.9 pg — ABNORMAL LOW (ref 26.0–34.0)
MCHC: 30 g/dL (ref 30.0–36.0)
MCV: 83 fL (ref 80.0–100.0)
Platelets: 357 10*3/uL (ref 150–400)
RBC: 3.77 MIL/uL — ABNORMAL LOW (ref 3.87–5.11)
RDW: 17.8 % — ABNORMAL HIGH (ref 11.5–15.5)
WBC: 9.3 10*3/uL (ref 4.0–10.5)
nRBC: 0 % (ref 0.0–0.2)

## 2021-09-27 LAB — RENAL FUNCTION PANEL
Albumin: 3.2 g/dL — ABNORMAL LOW (ref 3.5–5.0)
Anion gap: 13 (ref 5–15)
BUN: 22 mg/dL — ABNORMAL HIGH (ref 6–20)
CO2: 29 mmol/L (ref 22–32)
Calcium: 9 mg/dL (ref 8.9–10.3)
Chloride: 94 mmol/L — ABNORMAL LOW (ref 98–111)
Creatinine, Ser: 10.67 mg/dL — ABNORMAL HIGH (ref 0.44–1.00)
GFR, Estimated: 4 mL/min — ABNORMAL LOW (ref >60)
Glucose, Bld: 84 mg/dL (ref 70–99)
Phosphorus: 7.1 mg/dL — ABNORMAL HIGH (ref 2.5–4.6)
Potassium: 4.6 mmol/L (ref 3.5–5.1)
Sodium: 136 mmol/L (ref 135–145)

## 2021-09-27 MED ORDER — AMLODIPINE BESYLATE 10 MG PO TABS
10.0000 mg | ORAL_TABLET | Freq: Every day | ORAL | 1 refills | Status: AC
Start: 2021-09-27 — End: ?

## 2021-09-27 MED ORDER — HYDRALAZINE HCL 50 MG PO TABS: 50.0000 mg | ORAL_TABLET | Freq: Three times a day (TID) | ORAL | 1 refills | Status: AC

## 2021-09-27 MED ORDER — HYDRALAZINE HCL 50 MG PO TABS: 50.0000 mg | ORAL_TABLET | Freq: Three times a day (TID) | ORAL | Status: DC

## 2021-09-27 MED ORDER — HEPARIN SODIUM (PORCINE) 1000 UNIT/ML DIALYSIS: 1000.0000 [IU] | INTRAMUSCULAR | Status: DC | PRN

## 2021-09-27 MED ORDER — AMLODIPINE BESYLATE 10 MG PO TABS
10.0000 mg | ORAL_TABLET | Freq: Every day | ORAL | Status: DC
  Administered 2021-09-27: 10 mg via ORAL
  Filled 2021-09-27: qty 1

## 2021-09-27 MED ORDER — LIDOCAINE-PRILOCAINE 2.5-2.5 % EX CREA
1.0000 | TOPICAL_CREAM | CUTANEOUS | Status: DC | PRN
Start: 2021-09-27 — End: 2021-09-27

## 2021-09-27 MED ORDER — PENTAFLUOROPROP-TETRAFLUOROETH EX AERO
1.0000 | INHALATION_SPRAY | CUTANEOUS | Status: DC | PRN
Start: 2021-09-27 — End: 2021-09-27

## 2021-09-27 MED ORDER — ONDANSETRON HCL 4 MG PO TABS: 4.0000 mg | ORAL_TABLET | Freq: Four times a day (QID) | ORAL | 0 refills | Status: DC | PRN

## 2021-09-27 MED ORDER — ALTEPLASE 2 MG IJ SOLR: 2.0000 mg | Freq: Once | INTRAMUSCULAR | Status: DC | PRN

## 2021-09-27 MED ORDER — LIDOCAINE HCL (PF) 1 % IJ SOLN: 5.0000 mL | INTRAMUSCULAR | Status: DC | PRN

## 2021-09-27 MED ORDER — HYDRALAZINE HCL 25 MG PO TABS
25.0000 mg | ORAL_TABLET | Freq: Once | ORAL | Status: AC
  Administered 2021-09-27: 25 mg via ORAL
  Filled 2021-09-27: qty 1

## 2021-09-27 NOTE — Assessment & Plan Note (Signed)
-   See above #1.

## 2021-09-27 NOTE — Assessment & Plan Note (Signed)
-   See hypertension above.

## 2021-09-27 NOTE — Discharge Summary (Signed)
Physician Discharge Summary  Stacie Greene VVO:[id number] DOB: Dec 30, 1975 DOA: 09/25/2021  PCP: [name], NP  Admit date: 09/25/2021 Discharge date: 09/27/2021  Time spent: 60 minutes  Recommendations for Outpatient Follow-up:  Follow-up with [name], NP in 1 week.  On follow-up patient blood pressure need to be reassessed.  Patient will need a basic metabolic profile done to follow-up on electrolytes and renal function. Follow-up with Dr. Joelyn Greene, nephrology in 1 to 2 weeks. Follow-up at [facility] on 09/29/2021 to resume peritoneal dialysis training.   Discharge Diagnoses:  Principal Problem:   Intractable nausea and vomiting Active Problems:   Uremia   Hyperkalemia   ESRD (end stage renal disease) on dialysis [facility])   Essential hypertension   Coronary artery disease involving native coronary artery of native heart without angina pectoris   Chronic diastolic CHF (congestive heart failure) (HCC)   Mixed hyperlipidemia   localized maulopapular rash of abdomen    History of anemia due to chronic kidney disease   ESRD on hemodialysis (HCC)   Hypertensive urgency   Nausea and vomiting   Discharge Condition: Stable and improved  Diet recommendation: Heart healthy  Filed Weights   09/25/21 2244 09/27/21 0500 09/27/21 1447  Weight: 67.7 kg 65.2 kg 66.3 kg    History of present illness:  HPI per Dr. Ernesta Amble Stacie Greene is a 46 y.o. female with medical history significant of ESRD on dialysis, HTN, CAD with DES to LAD, HLD, grade 1 diastolic CHF, recurrent HSV who presented to Ed with complaints of nausea as well as missed dialysis session x1 and elevated blood pressure. She states about 2 weeks ago she had some vomiting spells and came to ER and was given some medication (pepcid, reglan and carafate)  She thought she was getting better and set up appointment to see GI on 6/22. Over the past 2 days she started to have nausea and vomiting again.  She has  had about 1 episode of vomiting for past 2 days. Constantly nauseated. She has been able to take her medication. She doesn't feel that the N/V is worse with eating or certain foods. Nothing makes it worse. No associated stomach pain. No diarrhea. No sick contacts. She states her blood pressure has been running high lately. She does not use NSAIDs, drinks caffeine and is stressed due to being out of work for the past month. No hx of international travel.    She is currently doing PD training and doing PD sessions since this Monday. She has missed two days of this due to her N/V.      Denied any fever/chills, vision changes/headaches, chest pain or palpitations, shortness of breath, abdominal pain, no diarrhea, dysuria or leg swelling. She has had some vaginal itching. No urinary frequency or urgency.    She does not smoke or drink alcohol.    ER Course:  vitals: afebrile, bp: 211/121, HR; 105, RR 16, oxygen: 96% Ra Pertinent labs: wbc: 11.1, hgb: 9.9, potassium: 5.7, BUN: 53, creatinine: 24.59, AG: 20,  CXR: pulmonary vascular congestion and probably very mild interstitial edema. Trace bilateral pleural effusions.  In ED: nephrology consulted. Given labetalol, rocephin for possible UTI, zofran and 500cc bolus as well as lokelma.  Hospital Course:   Assessment and Plan: * Intractable nausea and vomiting 46 year old female with 2 week history of intractable nausea with some vomiting that improved s/p medication; however, over past 2 days she had recurrence of nausea with emesis and elevated blood pressure  that resulted in her missing 2 sessions of her PD.  -Patient symptoms secondary to uremia. -more intractable nausea with one episode of vomiting/daily. Not associated with food/abdominal pain/BM changes. No pain on exam  - Normal lipase and transaminases. Most likely related to her uremia.  -Patient underwent hemodialysis on day of admission with clinical improvement.  -Patient seen by nephrology  and underwent hemodialysis on day of admission 09/25/2021,  09/26/2021 and also on day of discharge.  -Patient changed from Pepcid to Protonix and on Zofran as needed. -has GI appointment scheduled 6/22.  -UA with abnormal urine; however, she has no  Urinary symptoms.  Given rocephin in Ed.  -No antibiotics noted in the ED.  -Nephrology following the patient during the hospitalization felt patient's symptoms were secondary to uremia.  -Patient underwent hemodialysis on day of discharge and will resume home PD training on 09/30/2018 3 in the AM milligrams per kidney center.   -Outpatient follow-up with primary nephrologist, Dr. Sharyn Greene.   Uremia - See above #1.  Hyperkalemia -secondary to missed dialysis Medically managed with [facility] Nephrology consulted and patient underwent hemodialysis on 09/25/2021 and patient for another session of hemodialysis 09/26/2021 and also on day of discharge 09/27/2021.  -Hyperkalemia had resolved by day of discharge.  Potassium was 4.6 by day of discharge.  ESRD (end stage renal disease) on dialysis [facility]) Recently transitioned from HD to PD this past Monday prior to admission, currently in training and has only been doing PD. Missed 2 days of PD. AG of 20 with creatinine of 24.59 on presentation.  Nephrology consulted during hospitalization patient placed on HD until able to get back on track with PD. Potassium elevated, given lokelma. -Patient underwent hemodialysis 09/25/2021, 09/26/2021, 09/27/2021.  -Outpatient follow-up for PD training in the outpatient setting on Monday, 09/29/2021.   Essential hypertension HTN urgency on arrival with bp 211/121. She stated her blood pressure has been elevated over the past few weeks which could also be contributing to her N/V. No other symptoms.  Received labetalol IV in ED with improvement.  -Patient initially placed on home regimen of Norvasc 5 mg daily, hydralazine 25 mg 3 times daily, Coreg 25 mg twice daily, Cozaar 50 mg  daily. -IV hydralazine as needed. -Patient also on hemodialysis. -Blood pressure improved however noted still to be elevated and as such will be discharged home on increased dose of Norvasc 10 mg daily, increase dose of hydralazine 50 mg 3 times daily, continuation of home regimen Coreg and Cozaar. -Outpatient follow-up with PCP.  Coronary artery disease involving native coronary artery of native heart without angina pectoris Hx of NSTEMI in 03/2020 Cardiac catheterization revealed a 99% proximal LAD with right to left collaterals, 70% mid LAD, 40% distal LAD.  Patient had PCI of the LAD with 2 drug-eluting stents. Patient maintained on medical management with home regimen of ASA, brilinta and statin, beta-blocker, ARB. -Outpatient follow-up with cardiology as previously scheduled.   Chronic diastolic CHF (congestive heart failure) ([place]) Patient was euvolemic on examination during the hospitalization.  CXR with interstitial edema, not requiring any oxygen  Given 500cc bolus in ED Last echo: 4/23: EF of 60-65% with grade 1 DD Strict I/o Volume control via HD.  Mixed hyperlipidemia -Patient maintained on home regimen statin.   Hypertensive urgency - See hypertension above.  History of anemia due to chronic kidney disease Secondary to anemia of chronic disease.   -History of aranesp Folate at 6.0.   -Hemoglobin remained stable at 9.4. -Outpatient follow-up.  localized maulopapular rash of abdomen  Triamcinolone BID x 7 days         Procedures: Chest x-ray 09/25/2021  Consultations: Nephrology: Dr. Carolin Sicks 09/25/2021  Discharge Exam: Vitals:   09/27/21 1603 09/27/21 1634  BP: (!) 169/93 (!) 162/85  Pulse: 79 87  Resp: 11 18  Temp:    SpO2: 98% 100%    General: NAD. Cardiovascular: CTA B. Respiratory: Clear to auscultation bilaterally.  No wheezes, no crackles, no rhonchi.  Fair air movement.  Speaking in full sentences.  Discharge Instructions   Discharge  Instructions     Diet - low sodium heart healthy   Complete by: As directed    Increase activity slowly   Complete by: As directed    No wound care   Complete by: As directed       Allergies as of 09/27/2021       Reactions   2-octylcyanoacrylate [cyanoacrylate] Other (See Comments)   Blistering from Dermabond, see photo in the chart.         Medication List     STOP taking these medications    metoCLOPramide 10 MG tablet Commonly known as: REGLAN   oxyCODONE-acetaminophen 5-325 MG tablet Commonly known as: Percocet       TAKE these medications    acetaminophen 500 MG tablet Commonly known as: TYLENOL Take 1,000 mg by mouth every 6 (six) hours as needed for mild pain.   amLODipine 10 MG tablet Commonly known as: NORVASC Take 1 tablet (10 mg total) by mouth daily. What changed:  medication strength how much to take   aspirin EC 81 MG tablet Take 81 mg by mouth daily. Swallow whole.   atorvastatin 40 MG tablet Commonly known as: LIPITOR Take 1 tablet (40 mg total) by mouth daily at 6 PM. Schedule an appointment with cardiology for further refills, 1st attempt   carvedilol 25 MG tablet Commonly known as: COREG Take 1 tablet (25 mg total) by mouth 2 (two) times daily with a meal.   famotidine 20 MG tablet Commonly known as: PEPCID Take 1 tablet (20 mg total) by mouth 2 (two) times daily.   gentamicin cream 0.1 % Commonly known as: GARAMYCIN Apply 1 application. topically daily.   hydrALAZINE 50 MG tablet Commonly known as: APRESOLINE Take 1 tablet (50 mg total) by mouth every 8 (eight) hours. What changed:  medication strength how much to take   losartan 50 MG tablet Commonly known as: Cozaar Take 1 tablet (50 mg total) by mouth daily.   ondansetron 4 MG tablet Commonly known as: ZOFRAN Take 1 tablet (4 mg total) by mouth every 6 (six) hours as needed for nausea.   sevelamer carbonate 800 MG tablet Commonly known as: RENVELA Take 2 tablets  (1,600 mg total) by mouth 3 (three) times daily with meals.   sucralfate 1 g tablet Commonly known as: Carafate Take 1 tablet (1 g total) by mouth 4 (four) times daily -  with meals and at bedtime.   ticagrelor 90 MG Tabs tablet Commonly known as: BRILINTA Take 1 tablet (90 mg total) by mouth 2 (two) times daily.       Allergies  Allergen Reactions   2-Octylcyanoacrylate [Cyanoacrylate] Other (See Comments)    Blistering from Dermabond, see photo in the chart.     Follow-up Information     [name], NP. Schedule an appointment as soon as possible for a visit in 1 week(s).   Specialty: Family Medicine Contact information: [address]  [place] [address] [phone]         Rexene Agent, MD. Schedule an appointment as soon as possible for a visit in 1 week(s).   Specialty: Nephrology Why: Follow-up in 1 to 2 weeks. Contact information: [place] [address] [facility] up on 09/29/2021.   Why: Follow-up as scheduled in the morning to resume peritoneal dialysis training. Contact information: [address] [place] [address] [phone]                  The results of significant diagnostics from this hospitalization (including imaging, microbiology, ancillary and laboratory) are listed below for reference.    Significant Diagnostic Studies: DG Chest Portable 1 View  Result Date: 09/25/2021 CLINICAL DATA:  Vomiting and shortness of breath. History of end-stage renal disease. EXAM: PORTABLE CHEST 1 VIEW COMPARISON:  10/16/2020 chest radiograph FINDINGS: UPPER limits normal heart size and pulmonary vascular congestion noted. Minimal interstitial opacities are present likely representing very mild interstitial edema. Trace bilateral pleural effusions are noted. There is no evidence of pneumothorax. Surgical clips within the RIGHT neck are again noted. No acute bony abnormalities  are present. IMPRESSION: UPPER limits normal heart size with pulmonary vascular congestion and probable very mild interstitial edema. Trace bilateral pleural effusions. Electronically Signed   By: Margarette Canada M.D.   On: 09/25/2021 14:04    Microbiology: Recent Results (from the past 240 hour(s))  Urine Culture     Status: Abnormal   Collection Time: 09/25/21  4:41 PM   Specimen: Urine, Clean Catch  Result Value Ref Range Status   Specimen Description URINE, CLEAN CATCH  Final   Special Requests   Final    NONE Performed at [facility], [place] [address], [place], [place] [address]    Culture MULTIPLE SPECIES PRESENT, SUGGEST RECOLLECTION (A)  Final   Report Status 09/26/2021 FINAL  Final     Labs: Basic Metabolic Panel: Recent Labs  Lab 09/25/21 0915 09/25/21 2051 09/26/21 0836 09/27/21 0102  NA 142 137 134* 136  K 5.7* 3.4* 5.2* 4.6  CL 101 97* 94* 94*  CO2 21* 30 28 29   GLUCOSE 95 86 108* 84  BUN 53* 13 24* 22*  CREATININE 24.59* 8.01* 13.71* 10.67*  CALCIUM 9.1 8.5* 8.9 9.0  PHOS  --  3.7 7.9* 7.1*   Liver Function Tests: Recent Labs  Lab 09/25/21 0915 09/25/21 2051 09/26/21 0836 09/27/21 0102  AST 11*  --   --   --   ALT 7  --   --   --   ALKPHOS 65  --   --   --   BILITOT 0.6  --   --   --   PROT 7.2  --   --   --   ALBUMIN 3.4* 3.4* 3.5 3.2*   Recent Labs  Lab 09/25/21 0915  LIPASE 28   No results for input(s): "AMMONIA" in the last 168 hours. CBC: Recent Labs  Lab 09/25/21 0915 09/25/21 1618 09/26/21 0836 09/27/21 0102  WBC 11.1* 10.9* 9.5 9.3  NEUTROABS 8.1*  --   --   --   HGB 9.9* 9.3* 10.2* 9.4*  HCT 32.6* 30.3* 33.0* 31.3*  MCV 82.3 82.1 81.1 83.0  PLT 377 375 420* 357   Cardiac Enzymes: No results for input(s): "CKTOTAL", "CKMB", "CKMBINDEX", "TROPONINI" in the last 168 hours. BNP: BNP (last 3 results) No results  for input(s): "BNP" in the last 8760 hours.  ProBNP (last 3 results) No results for input(s): "PROBNP" in the  last 8760 hours.  CBG: No results for input(s): "GLUCAP" in the last 168 hours.     Signed:  Irine Seal MD.  [facility] 09/27/2021, 4:42 PM

## 2021-09-27 NOTE — Progress Notes (Signed)
I spoke with the on-call home therapy RN. Patient actually recently started PD training (only 2 days) and does not have PD supplies yet at home. Plan for Ms. Dambrosia to resume PD training on 09/29/21 in AM at [facility]. In the meantime, patient will receive short HD treatment this afternoon. She can then be discharged after HD today.  Tobie Poet, NP [facility]

## 2021-09-27 NOTE — Progress Notes (Signed)
[facility] Progress Note   Subjective:    Seen and examined patient at bedside. Seen sitting at bedside. Felling better. She reports N/V are improving. Denies SOB and CP. Tolerated yesterday's HD with net UF 1L. Plan for short HD today to keep patient on schedule. I anticipate she will be able to go home after short tx today. She can then resume PD training on Monday.  Objective Vitals:   09/26/21 2052 09/27/21 0500 09/27/21 0518 09/27/21 0722  BP: (!) 148/87  (!) 163/89 (!) 150/79  Pulse: (!) 108  94 90  Resp:   15 16  Temp: 98.6 F (37 C)  98.3 F (36.8 C) 98.8 F (37.1 C)  TempSrc: Oral  Oral Oral  SpO2: 97%  99% 97%  Weight:  65.2 kg    Height:       Physical Exam General: Awake and alert; looks better; now on RA; NAD Heart: Normal S1 and S2; No murmurs, gallops, or rubs Lungs: Clear throughout; No wheezing, rales, or rhonchi Abdomen: Soft; slightly tender to palpation RUQ (d/t SQ heparin) Extremities: No edema BLLE Dialysis Access: L AVF (+) B/T; PD catheter LUQ   Filed Weights   09/25/21 1608 09/25/21 2244 09/27/21 0500  Weight: 68.5 kg 67.7 kg 65.2 kg    Intake/Output Summary (Last 24 hours) at 09/27/2021 0923 Last data filed at 09/26/2021 2040 Gross per 24 hour  Intake 120 ml  Output --  Net 120 ml    Additional Objective Labs: Basic Metabolic Panel: Recent Labs  Lab 09/25/21 2051 09/26/21 0836 09/27/21 0102  NA 137 134* 136  K 3.4* 5.2* 4.6  CL 97* 94* 94*  CO2 30 28 29   GLUCOSE 86 108* 84  BUN 13 24* 22*  CREATININE 8.01* 13.71* 10.67*  CALCIUM 8.5* 8.9 9.0  PHOS 3.7 7.9* 7.1*   Liver Function Tests: Recent Labs  Lab 09/25/21 0915 09/25/21 2051 09/26/21 0836 09/27/21 0102  AST 11*  --   --   --   ALT 7  --   --   --   ALKPHOS 65  --   --   --   BILITOT 0.6  --   --   --   PROT 7.2  --   --   --   ALBUMIN 3.4* 3.4* 3.5 3.2*   Recent Labs  Lab 09/25/21 0915  LIPASE 28   CBC: Recent Labs  Lab 09/25/21 0915  09/25/21 1618 09/26/21 0836 09/27/21 0102  WBC 11.1* 10.9* 9.5 9.3  NEUTROABS 8.1*  --   --   --   HGB 9.9* 9.3* 10.2* 9.4*  HCT 32.6* 30.3* 33.0* 31.3*  MCV 82.3 82.1 81.1 83.0  PLT 377 375 420* 357   Blood Culture    Component Value Date/Time   SDES URINE, CLEAN CATCH 09/25/2021 1641   SPECREQUEST  09/25/2021 1641    NONE Performed at [facility], [place] [address], [place], [place] [address]    CULT MULTIPLE SPECIES PRESENT, SUGGEST RECOLLECTION (A) 09/25/2021 1641   REPTSTATUS 09/26/2021 FINAL 09/25/2021 1641    Cardiac Enzymes: No results for input(s): "CKTOTAL", "CKMB", "CKMBINDEX", "TROPONINI" in the last 168 hours. CBG: No results for input(s): "GLUCAP" in the last 168 hours. Iron Studies: No results for input(s): "IRON", "TIBC", "TRANSFERRIN", "FERRITIN" in the last 72 hours. No results found for: "INR", "PROTIME" Studies/Results: DG Chest Portable 1 View  Result Date: 09/25/2021 CLINICAL DATA:  Vomiting and shortness of breath. History of end-stage renal  disease. EXAM: PORTABLE CHEST 1 VIEW COMPARISON:  10/16/2020 chest radiograph FINDINGS: UPPER limits normal heart size and pulmonary vascular congestion noted. Minimal interstitial opacities are present likely representing very mild interstitial edema. Trace bilateral pleural effusions are noted. There is no evidence of pneumothorax. Surgical clips within the RIGHT neck are again noted. No acute bony abnormalities are present. IMPRESSION: UPPER limits normal heart size with pulmonary vascular congestion and probable very mild interstitial edema. Trace bilateral pleural effusions. Electronically Signed   By: Margarette Canada M.D.   On: 09/25/2021 14:04    Medications:  sodium chloride      amLODipine  10 mg Oral Daily   aspirin EC  81 mg Oral Daily   atorvastatin  40 mg Oral q1800   carvedilol  25 mg Oral BID WC   Chlorhexidine Gluconate Cloth  6 each Topical Q0600   feeding supplement (NEPRO CARB STEADY)  237 mL Oral  BID BM   heparin  5,000 Units Subcutaneous Q8H   hydrALAZINE  25 mg Oral Q8H   losartan  50 mg Oral Daily   multivitamin  1 tablet Oral QHS   pantoprazole (PROTONIX) IV  40 mg Intravenous Q24H   sevelamer carbonate  1,600 mg Oral TID WC   sodium chloride flush  3 mL Intravenous Q12H   ticagrelor  90 mg Oral BID   triamcinolone cream   Topical BID    Dialysis Orders: Dialysis Orders: Center: [facility]= TTS 4 hours EDW of 67.5 kg No heparin Mircera 150 mcg last given 5/30 Hectorol 4 mcg *Currently undergoing PD training*  Assessment/Plan: Uremia= secondary to missing HD treatments. Received HD yesterday. Feeling better. Discussed with patient not missing HD treatments while training for PD. ESRD -HD TTS. Received HD 6/8-net UF 1.9L and 6/9-net UF 1L. Patient feeling better and N/V improving. Plan for short HD today to keep her on routine schedule. This will be her third tx for this week. Will d/w Dr. Posey Pronto in regards to resuming PD training next week. Current K+ 4.6. Hypertension/volume-hypertensive secondary to missing HD. Bps slowly improving. Continue BP meds UF as tolerated. 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 Continue ESA Metabolic bone disease -PO4 at goal. Continue TUMS and binders. Nutrition -renal diet// renal vitamin Dispo: Patient for possible dc today after short HD. I spoke with on-call home therapy HD RN-apparently, patient completed PD training. Will d/w Dr. Posey Pronto when patient can resume PD.  Stacie Poet, NP [facility] 09/27/2021,9:23 AM  LOS: 1 day

## 2021-09-29 ENCOUNTER — Telehealth: Payer: Self-pay

## 2021-09-29 ENCOUNTER — Other Ambulatory Visit (HOSPITAL_COMMUNITY): Payer: Self-pay

## 2021-09-29 NOTE — Telephone Encounter (Signed)
Transition Care Management Follow-up Telephone Call Date of discharge and from where: 09/27/21 [facility]  How have you been since you were released from the hospital? Much better Any questions or concerns? Yes will call to locate where medications were called in after discharge   Items Reviewed: Did the pt receive and understand the discharge instructions provided? Yes  Medications obtained and verified? Yes  Other? No  Any new allergies since your discharge? No  Dietary orders reviewed? Yes Do you have support at home? Yes   Home Care and Equipment/Supplies: Were home health services ordered? not applicable If so, what is the name of the agency?   Has the agency set up a time to come to the patient's home? not applicable Were any new equipment or medical supplies ordered?  No What is the name of the medical supply agency?  Were you able to get the supplies/equipment? not applicable Do you have any questions related to the use of the equipment or supplies? No  Functional Questionnaire: (I = Independent and D = Dependent) ADLs: I  Bathing/Dressing- I  Meal Prep- I  Eating- I  Maintaining continence- I  Transferring/Ambulation- I  Managing Meds- I  Follow up appointments reviewed:  PCP Hospital f/u appt confirmed? No  pt was at kidney center will call to make appt  [facility] f/u appt confirmed? Yes  Scheduled to see GI , on 10/09/21  @ 1:30. Are transportation arrangements needed? No  If their condition worsens, is the pt aware to call PCP or go to the Emergency Dept.? Yes Was the patient provided with contact information for the PCP's office or ED? Yes Was to pt encouraged to call back with questions or concerns? Yes

## 2021-10-01 ENCOUNTER — Telehealth: Payer: Self-pay | Admitting: Family

## 2021-10-01 NOTE — Telephone Encounter (Signed)
Pt states she is finishing PD training and cannot make it to [facility] for her HFU sooner than 06/20.  Pt states she is out of her medications that were prescribed while in the hospital.   Pt request prescriptions to be written to get her to her [facility] appointment on 06/21 with PCP.   Please call pt for further information and determination about prescriptions.

## 2021-10-02 ENCOUNTER — Other Ambulatory Visit: Payer: Self-pay | Admitting: Family

## 2021-10-02 ENCOUNTER — Other Ambulatory Visit: Payer: Self-pay

## 2021-10-02 MED ORDER — CARVEDILOL 25 MG PO TABS: 25.0000 mg | ORAL_TABLET | Freq: Two times a day (BID) | ORAL | 0 refills | Status: DC

## 2021-10-02 MED ORDER — TICAGRELOR 90 MG PO TABS: 90.0000 mg | ORAL_TABLET | Freq: Two times a day (BID) | ORAL | 0 refills | Status: DC

## 2021-10-02 NOTE — Telephone Encounter (Signed)
Please see msg for [place] pt and advise

## 2021-10-02 NOTE — Telephone Encounter (Signed)
I called and spoke with pt. Carvedilol and Brilinta was sent in. I let pt know I will be sending in a 30 days supply. Pt will need to schedule an appointment for further refills due to not following up around 09/01/2021. Pt gave a verbalized understanding.

## 2021-10-08 ENCOUNTER — Encounter: Payer: Self-pay | Admitting: Family

## 2021-10-08 ENCOUNTER — Ambulatory Visit (INDEPENDENT_AMBULATORY_CARE_PROVIDER_SITE_OTHER): Payer: POS | Admitting: Family

## 2021-10-08 VITALS — BP 158/80 | HR 93 | Temp 97.9°F | Ht 66.0 in | Wt 145.2 lb

## 2021-10-08 DIAGNOSIS — N186 End stage renal disease: Secondary | ICD-10-CM | POA: Diagnosis not present

## 2021-10-08 DIAGNOSIS — R112 Nausea with vomiting, unspecified: Secondary | ICD-10-CM | POA: Diagnosis not present

## 2021-10-08 DIAGNOSIS — Z09 Encounter for follow-up examination after completed treatment for conditions other than malignant neoplasm: Secondary | ICD-10-CM | POA: Diagnosis not present

## 2021-10-08 DIAGNOSIS — Z992 Dependence on renal dialysis: Secondary | ICD-10-CM

## 2021-10-08 DIAGNOSIS — G4701 Insomnia due to medical condition: Secondary | ICD-10-CM

## 2021-10-08 MED ORDER — TRAZODONE HCL 50 MG PO TABS: 25.0000 mg | ORAL_TABLET | Freq: Every evening | ORAL | 2 refills | Status: AC | PRN

## 2021-10-08 MED ORDER — ONDANSETRON 4 MG PO TBDP: 4.0000 mg | ORAL_TABLET | Freq: Three times a day (TID) | ORAL | 0 refills | Status: DC | PRN

## 2021-10-08 MED ORDER — FAMOTIDINE 20 MG PO TABS: 20.0000 mg | ORAL_TABLET | Freq: Two times a day (BID) | ORAL | 0 refills | Status: DC

## 2021-10-08 NOTE — Assessment & Plan Note (Addendum)
Chronic - pt in process of learning peritoneal dialysis but having intractable N,V with bloating, and unable to get to hemodialysis tx, developed uremia, back to hospital. pt has finished training and is doing the peritoneal procedure at home, no labs since back home, checking BMP today.

## 2021-10-08 NOTE — Progress Notes (Signed)
Subjective:     Patient ID: [facility], female    DOB: [birth year], 46 y.o.   MRN: [id number]  Chief Complaint  Patient presents with   Follow-up    ED follow up , Pt went to the ER because she was not able to hold anything down. Pt c/o being bloated after dialysis.    HPI: Follow up Hospitalization:  Patient was admitted to [facility] on 09/25/2021 and discharged on 09/27/2021. She was treated for uremia. Treatment for this included hemodialysis. Telephone follow up was done. She reports good compliance with treatment. She reports this condition is improved, but still having nausea. Told to f/u on 6/12 for peritoneal dialysis training. INSOMNIA:  How long: months. Difficulty initiating sleep: yes.  Difficulty maintaining sleep: yes.  OTC meds tried: none. RX meds in past: none. Sleep hygiene measures: yes. Started any new meds recently: no. Shift worker: no. New stressors: recent hospitalization, learning new home peritoneal dialysis procedure.  ESRD on dialysis:   pt reports having CKD w/HTN prior to last year, but after she had Covid it worsened her kidney fx and she had to start dialysis. Pt was non-compliant with tx due to trying to work and skipped sessions,  ended up in hospital recently with uremia. Has since restarted dialysis, but is considering peritoneal if insurance will cover.  Assessment & Plan:   Problem List Items Addressed This Visit       Digestive   Nausea and vomiting - due to new peritoneal dialysis, bloated, given pepcid and zofran in hospital, but no home Rx, sending today.    Relevant Medications   famotidine (PEPCID) 20 MG tablet   ondansetron (ZOFRAN-ODT) 4 MG disintegrating tablet     Genitourinary   ESRD (end stage renal disease) on dialysis ([place]) - Primary    Chronic - pt in process of learning peritoneal dialysis but having intractable N,V and too ill to get to dialysis tx, developed uremia, back to hospital. pt has finished training and is doing the  peritoneal procedure at home, no labs since back home, checking BMP today.       Relevant Orders   Basic Metabolic Panel (BMET)     Other   Insomnia due to medical condition    New - pt unable to sleep with doing home dialysis, N,V, not getting any rest. Starting Trazodone, advised on use & SE, f/u in 3 mos or sooner if needed.      Relevant Medications   traZODone (DESYREL) 50 MG tablet   Other Visit Diagnoses     Hospital discharge follow-up    - 6/8-6/01/2022 - uremia, hemodialyzed, malignant HTN, BP ok today, still having N,V, doing peritoneal dialysis, causing sx with bloating.      Outpatient Medications Prior to Visit  Medication Sig Dispense Refill   acetaminophen (TYLENOL) 500 MG tablet Take 1,000 mg by mouth every 6 (six) hours as needed for mild pain.     amLODipine (NORVASC) 10 MG tablet Take 1 tablet (10 mg total) by mouth daily. 30 tablet 1   aspirin EC 81 MG tablet Take 81 mg by mouth daily. Swallow whole.     atorvastatin (LIPITOR) 40 MG tablet Take 1 tablet (40 mg total) by mouth daily at 6 PM. Schedule an appointment with cardiology for further refills, 1st attempt 30 tablet 3   carvedilol (COREG) 25 MG tablet Take 1 tablet (25 mg total) by mouth 2 (two) times daily with a meal. 60 tablet 0  gentamicin cream (GARAMYCIN) 0.1 % Apply 1 application. topically daily.     hydrALAZINE (APRESOLINE) 50 MG tablet Take 1 tablet (50 mg total) by mouth every 8 (eight) hours. 90 tablet 1   losartan (COZAAR) 50 MG tablet Take 1 tablet (50 mg total) by mouth daily. 30 tablet 3   sevelamer carbonate (RENVELA) 800 MG tablet Take 2 tablets (1,600 mg total) by mouth 3 (three) times daily with meals. 180 tablet 3   sucralfate (CARAFATE) 1 g tablet Take 1 tablet (1 g total) by mouth 4 (four) times daily -  with meals and at bedtime. 21 tablet 0   ticagrelor (BRILINTA) 90 MG TABS tablet Take 1 tablet (90 mg total) by mouth 2 (two) times daily. 60 tablet 0   famotidine (PEPCID) 20 MG  tablet Take 1 tablet (20 mg total) by mouth 2 (two) times daily. 30 tablet 0   ondansetron (ZOFRAN) 4 MG tablet Take 1 tablet (4 mg total) by mouth every 6 (six) hours as needed for nausea. 20 tablet 0   No facility-administered medications prior to visit.    Past Medical History:  Diagnosis Date   Acute on chronic kidney failure ([place]) 06/01/2019   Chronic kidney disease 07/12/2019   Coronary artery disease    COVID-19 08/04/2021   Cyst of ovary 01/24/2020   ESRD due to hypertension ([place])    ESRD on hemodialysis ([place])    Heart attack ([place]) 2021   Hematuria 01/24/2020   Herpes    Hypertension secondary to other renal disorders 01/24/2020    Past Surgical History:  Procedure Laterality Date   APPENDECTOMY     CAPD INSERTION N/A 09/03/2021   Procedure: LAPAROSCOPIC INSERTION CONTINUOUS AMBULATORY PERITONEAL DIALYSIS  (CAPD) CATHETER;  Surgeon: Cherre Robins, MD;  Location: [facility];  Service: Vascular;  Laterality: N/A;   COLONOSCOPY WITH PROPOFOL N/A 10/18/2020   Procedure: COLONOSCOPY WITH PROPOFOL;  Surgeon: Jackquline Denmark, MD;  Location: [facility];  Service: Gastroenterology;  Laterality: N/A;   COLONOSCOPY WITH PROPOFOL N/A 10/19/2020   Procedure: COLONOSCOPY WITH PROPOFOL;  Surgeon: Mauri Pole, MD;  Location: [facility];  Service: Endoscopy;  Laterality: N/A;   CORONARY STENT INTERVENTION N/A 03/29/2020   Procedure: CORONARY STENT INTERVENTION;  Surgeon: Burnell Blanks, MD;  Location: [facility];  Service: Cardiovascular;  Laterality: N/A;   ESOPHAGOGASTRODUODENOSCOPY (EGD) WITH PROPOFOL N/A 10/18/2020   Procedure: ESOPHAGOGASTRODUODENOSCOPY (EGD) WITH PROPOFOL;  Surgeon: Jackquline Denmark, MD;  Location: [facility];  Service: Gastroenterology;  Laterality: N/A;   HOT HEMOSTASIS N/A 10/18/2020   Procedure: HOT HEMOSTASIS (ARGON PLASMA COAGULATION/BICAP);  Surgeon: Jackquline Denmark, MD;  Location: [facility]COPY;  Service: Gastroenterology;  Laterality: N/A;    HYSTEROPLASTY     INTRAVASCULAR ULTRASOUND/IVUS N/A 03/29/2020   Procedure: Intravascular Ultrasound/IVUS;  Surgeon: Burnell Blanks, MD;  Location: [facility];  Service: Cardiovascular;  Laterality: N/A;   LEFT HEART CATH AND CORONARY ANGIOGRAPHY N/A 03/29/2020   Procedure: LEFT HEART CATH AND CORONARY ANGIOGRAPHY;  Surgeon: Burnell Blanks, MD;  Location: [facility];  Service: Cardiovascular;  Laterality: N/A;   THYROID SURGERY      Allergies  Allergen Reactions   2-Octylcyanoacrylate [Cyanoacrylate] Other (See Comments)    Blistering from Dermabond, see photo in the chart.        Objective:    Physical Exam Vitals and nursing note reviewed.  Constitutional:      Appearance: She is ill-appearing. She is not toxic-appearing.  Cardiovascular:  Rate and Rhythm: Normal rate and regular rhythm.  Pulmonary:     Effort: Pulmonary effort is normal.     Breath sounds: Normal breath sounds.  Musculoskeletal:        General: Normal range of motion.  Skin:    General: Skin is warm and dry.  Neurological:     Mental Status: She is alert.  Psychiatric:        Behavior: Behavior normal.     BP (!) 158/80 (BP Location: Left Arm, Patient Position: Sitting, Cuff Size: Large)   Pulse 93   Temp 97.9 F (36.6 C) (Temporal)   Ht 5\' 6"  (1.676 m)   Wt 145 lb 4 oz (65.9 kg)   SpO2 99%   BMI 23.44 kg/m  Wt Readings from Last 3 Encounters:  10/08/21 145 lb 4 oz (65.9 kg)  09/27/21 143 lb 8.3 oz (65.1 kg)  09/12/21 151 lb (68.5 kg)        Meds ordered this encounter  Medications   famotidine (PEPCID) 20 MG tablet    Sig: Take 1 tablet (20 mg total) by mouth 2 (two) times daily.    Dispense:  30 tablet    Refill:  0   ondansetron (ZOFRAN-ODT) 4 MG disintegrating tablet    Sig: Take 1 tablet (4 mg total) by mouth every 8 (eight) hours as needed for nausea or vomiting.    Dispense:  30 tablet    Refill:  0    Order Specific Question:    Supervising Provider    Answer:   ANDY, CAMILLE L [[id number]]   traZODone (DESYREL) 50 MG tablet    Sig: Take 0.5-1 tablets (25-50 mg total) by mouth at bedtime as needed for sleep.    Dispense:  30 tablet    Refill:  2    Order Specific Question:   Supervising Provider    Answer:   ANDY, CAMILLE L [[id number]]    [name], NP

## 2021-10-08 NOTE — Assessment & Plan Note (Signed)
New - pt unable to sleep with doing home dialysis, N,V, not getting any rest. Starting Trazodone, advised on use & SE, f/u in 3 mos or sooner if needed.

## 2021-10-08 NOTE — Patient Instructions (Signed)
It was very nice to see you today!  I have sent in refills for Famotidine & oral disintegrating Zofran. Let me know if this is too expensive.  I also have sent Trazodone to help with your sleeping. Start with 1/2 pill, can increase to 1 pill if not helping, take this 30 minutes -1 hour before going to sleep.  Schedule a 3 mos follow up visit for refills if needed.   PLEASE NOTE:  If you had any lab tests please let us know if you have not heard back within a few days. You may see your results on MyChart before we have a chance to review them but we will give you a call once they are reviewed by [place]. If we ordered any referrals today, please let us know if you have not heard from their office within the next week.

## 2021-10-09 ENCOUNTER — Ambulatory Visit: Payer: POS | Admitting: Gastroenterology

## 2021-10-10 ENCOUNTER — Other Ambulatory Visit (INDEPENDENT_AMBULATORY_CARE_PROVIDER_SITE_OTHER): Payer: POS

## 2021-10-10 ENCOUNTER — Telehealth: Payer: Self-pay

## 2021-10-10 DIAGNOSIS — Z992 Dependence on renal dialysis: Secondary | ICD-10-CM | POA: Diagnosis not present

## 2021-10-10 DIAGNOSIS — N186 End stage renal disease: Secondary | ICD-10-CM | POA: Diagnosis not present

## 2021-10-10 LAB — BASIC METABOLIC PANEL
BUN: 56 mg/dL — ABNORMAL HIGH (ref 6–23)
CO2: 29 mEq/L (ref 19–32)
Calcium: 9.8 mg/dL (ref 8.4–10.5)
Chloride: 93 mEq/L — ABNORMAL LOW (ref 96–112)
Creatinine, Ser: 23.98 mg/dL (ref 0.40–1.20)
GFR: 1.5 mL/min — CL (ref >60.00)
Glucose, Bld: 105 mg/dL — ABNORMAL HIGH (ref 70–99)
Potassium: 5.7 mEq/L — ABNORMAL HIGH (ref 3.5–5.1)
Sodium: 141 mEq/L (ref 135–145)

## 2021-10-23 ENCOUNTER — Other Ambulatory Visit: Payer: Self-pay | Admitting: Family

## 2021-10-28 ENCOUNTER — Other Ambulatory Visit: Payer: Self-pay

## 2021-10-28 MED ORDER — ATORVASTATIN CALCIUM 40 MG PO TABS: 40.0000 mg | ORAL_TABLET | Freq: Every day | ORAL | 1 refills | Status: AC

## 2021-10-30 ENCOUNTER — Encounter: Payer: Self-pay | Admitting: Gastroenterology

## 2021-10-30 ENCOUNTER — Ambulatory Visit (INDEPENDENT_AMBULATORY_CARE_PROVIDER_SITE_OTHER): Payer: POS | Admitting: Gastroenterology

## 2021-10-30 VITALS — BP 138/78 | HR 81 | Ht 66.0 in | Wt 145.2 lb

## 2021-10-30 DIAGNOSIS — R112 Nausea with vomiting, unspecified: Secondary | ICD-10-CM | POA: Diagnosis not present

## 2021-10-30 DIAGNOSIS — Z01818 Encounter for other preprocedural examination: Secondary | ICD-10-CM

## 2021-10-30 DIAGNOSIS — K449 Diaphragmatic hernia without obstruction or gangrene: Secondary | ICD-10-CM | POA: Diagnosis not present

## 2021-10-30 DIAGNOSIS — K219 Gastro-esophageal reflux disease without esophagitis: Secondary | ICD-10-CM | POA: Diagnosis not present

## 2021-10-30 MED ORDER — ONDANSETRON 4 MG PO TBDP: 4.0000 mg | ORAL_TABLET | Freq: Three times a day (TID) | ORAL | 1 refills | Status: AC | PRN

## 2021-10-30 MED ORDER — PANTOPRAZOLE SODIUM 40 MG PO TBEC: 40.0000 mg | DELAYED_RELEASE_TABLET | Freq: Every day | ORAL | 3 refills | Status: AC

## 2021-10-30 NOTE — Patient Instructions (Addendum)
If you are age 45 or older, your body mass index should be between 23-30. Your Body mass index is 23.44 kg/m. If this is out of the aforementioned range listed, please consider follow up with your Primary Care Provider.  If you are age 56 or younger, your body mass index should be between 19-25. Your Body mass index is 23.44 kg/m. If this is out of the aformentioned range listed, please consider follow up with your Primary Care Provider.   ________________________________________________________  The  GI providers would like to encourage you to use [facility] to communicate with providers for non-urgent requests or questions.  Due to long hold times on the telephone, sending your provider a message by [facility] may be a faster and more efficient way to get a response.  Please allow 48 business hours for a response.  Please remember that this is for non-urgent requests.  _______________________________________________________  We have sent the following medications to your pharmacy for you to pick up at your convenience: Protonix Zofran  You have been scheduled for a CT [facility] the abdomen and pelvis [facility], [place], [place] [address]).   You are scheduled on Thursday 11-13-2021 at [facility] should arrive 30 minutes prior to your appointment time for registration. Please follow the written instructions below on the day of your exam:  WARNING: IF YOU ARE ALLERGIC TO IODINE/X-RAY DYE, PLEASE NOTIFY RADIOLOGY IMMEDIATELY AT [phone]! YOU WILL BE GIVEN A 13 HOUR PREMEDICATION PREP.  1) Do not eat or drink anything after 5am (4 hours prior to your test) 2) You have been given 2 bottles of oral contrast to drink. The solution may taste better if refrigerated, but do NOT add ice or any other liquid to this solution. Shake well before drinking.             Drink 1 bottle of contrast @ 7am (2 hours prior to your exam)            Drink 1 bottle of contrast @ 8am (1  hour prior to your exam)  You may take any medications as prescribed with a small amount of water, if necessary. If you take any of the following medications: METFORMIN, GLUCOPHAGE, GLUCOVANCE, AVANDAMET, RIOMET, FORTAMET, [place] MET, JANUMET, GLUMETZA or METAGLIP, you MAY be asked to HOLD this medication 48 hours AFTER the exam.  The purpose of you drinking the oral contrast is to aid in the visualization of your intestinal tract. The contrast solution may cause some diarrhea. Depending on your individual set of symptoms, you may also receive an intravenous injection of x-ray contrast/dye. Plan on being at [facility] for 30 minutes or longer, depending on the type of exam you are having performed.  This test typically takes 30-45 minutes to complete.  If you have any questions regarding your exam or if you need to reschedule, you may call the CT department at [phone] between the hours of 8:00 am and 5:00 pm, Monday-Friday.  ________________________________________________________________________   Dennis Bast have been scheduled for a gastric emptying scan at [facility] on Tuesday 11-18-2021 at 730am. Please arrive at least 30 minutes prior to your appointment for registration. Please make certain not to have anything to eat or drink after midnight the night before your test. Hold all medications the morning of the procedure. Hold all stomach medications (ex: Zofran, phenergan, Reglan) 48 hours prior to your test. If you need to reschedule your appointment, please contact radiology scheduling at  [phone]. _____________________________________________________________________ A gastric-emptying study measures how long it takes for food to move through your stomach. There are several ways to measure stomach emptying. In the most common test, you eat food that contains a small amount of radioactive material. A scanner that detects the movement of the radioactive material is placed  over your abdomen to monitor the rate at which food leaves your stomach. This test normally takes about 4 hours to complete. _____________________________________________________________________   Thank you,  Dr. Jackquline Denmark

## 2021-10-30 NOTE — Progress Notes (Signed)
Chief Complaint: FU  Referring Provider:  [name], NP      ASSESSMENT AND PLAN;   #1. GERD with small HH  #2. N/V (better). Likely d/t recent HTN urgency, uremia d/t missed PD. Neg EGD 10/2020  Plan: -Protonix 40mg  po QD #90, 4RF -Zofran 4mg  ODT Q8hrs prn #30 -GES to r/o assoc gastroparesis -Proceed with CT AP with PO contrast only (pretransplant eval as suggested by [facility]) -D/W pt and pts mom in detail   HPI:    Stacie Greene is a 46 y.o. female  With ESRD d/t HTN on PD Q day with uremia, CAD s/p DES to LAD on Brilinta, HLD, dCHF, anemia of chronic disease Accompanied by her mother.  FU from recent hospitalization due to N/V d/t uremia/HTN urgency Better now with Zofran Negative recent GI work-up including EGD as below. Now she has been more compliant with peritoneal dialysis Has follow-up appointment with nephrology in coming days Being considered for renal transplant at [facility] have recommended CT  She does have occasional heartburn without odynophagia or dysphagia.   No abdominal pain. No weight loss Denies having any melena or hematochezia. No fever chills  She does get PD catheter and refluxate checked by nephrology almost every month.  No peritonitis   Wt Readings from Last 3 Encounters:  10/30/21 145 lb 4 oz (65.9 kg)  10/08/21 145 lb 4 oz (65.9 kg)  09/27/21 143 lb 8.3 oz (65.1 kg)    Past GI work-up: EGD 10/18/2020 - Small hiatal hernia. 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 Treated with argon plasma coagulation (APC). - No active UGI bleeding.   Colon 10/19/2020 - Non-bleeding internal hemorrhoids. - The examination was otherwise normal. - No specimens collected. - Rpt 10 yrs     Past Medical History:  Diagnosis Date   Acute on chronic kidney failure ([place]) 06/01/2019   Chronic kidney disease 07/12/2019   Coronary artery disease    COVID-19 08/04/2021   Cyst of ovary 01/24/2020   ESRD due to  hypertension ([place])    ESRD on hemodialysis ([place])    Heart attack ([place]) 2021   Hematuria 01/24/2020   Herpes    Hypertension secondary to other renal disorders 01/24/2020   localized maulopapular rash of abdomen  09/25/2021    Past Surgical History:  Procedure Laterality Date   APPENDECTOMY     CAPD INSERTION N/A 09/03/2021   Procedure: LAPAROSCOPIC INSERTION CONTINUOUS AMBULATORY PERITONEAL DIALYSIS  (CAPD) CATHETER;  Surgeon: Cherre Robins, MD;  Location: [place];  Service: Vascular;  Laterality: N/A;   COLONOSCOPY WITH PROPOFOL N/A 10/18/2020   Procedure: COLONOSCOPY WITH PROPOFOL;  Surgeon: Jackquline Denmark, MD;  Location: [place];  Service: Gastroenterology;  Laterality: N/A;   COLONOSCOPY WITH PROPOFOL N/A 10/19/2020   Procedure: COLONOSCOPY WITH PROPOFOL;  Surgeon: Mauri Pole, MD;  Location: [place] ENDOSCOPY;  Service: Endoscopy;  Laterality: N/A;   CORONARY STENT INTERVENTION N/A 03/29/2020   Procedure: CORONARY STENT INTERVENTION;  Surgeon: Burnell Blanks, MD;  Location: [place] CV [facility];  Service: Cardiovascular;  Laterality: N/A;   ESOPHAGOGASTRODUODENOSCOPY (EGD) WITH PROPOFOL N/A 10/18/2020   Procedure: ESOPHAGOGASTRODUODENOSCOPY (EGD) WITH PROPOFOL;  Surgeon: Jackquline Denmark, MD;  Location: [place];  Service: Gastroenterology;  Laterality: N/A;   HOT HEMOSTASIS N/A 10/18/2020   Procedure: HOT HEMOSTASIS (ARGON PLASMA COAGULATION/BICAP);  Surgeon: Jackquline Denmark, MD;  Location[facility] ENDOSCOPY;  Service: Gastroenterology;  Laterality: N/A;   HYSTEROPLASTY     INTRAVASCULAR ULTRASOUND/IVUS N/A  03/29/2020   Procedure: Intravascular Ultrasound/IVUS;  Surgeon: Burnell Blanks, MD;  Location: [place] [facility];  Service: Cardiovascular;  Laterality: N/A;   LEFT HEART CATH AND CORONARY ANGIOGRAPHY N/A 03/29/2020   Procedure: LEFT HEART CATH AND CORONARY ANGIOGRAPHY;  Surgeon: Burnell Blanks, MD;  Location: [place] [facility];  Service:  Cardiovascular;  Laterality: N/A;   THYROID SURGERY      Family History  Problem Relation Age of Onset   Diabetes Mother    Congestive Heart Failure Father    Colon cancer Neg Hx    Rectal cancer Neg Hx    Stomach cancer Neg Hx    Esophageal cancer Neg Hx     Social History   Tobacco Use   Smoking status: Former    Years: 10.00    Types: Cigarettes    Quit date: 03/29/2020    Years since quitting: 1.5   Smokeless tobacco: Never  Vaping Use   Vaping Use: Never used  Substance Use Topics   Alcohol use: Not Currently   Drug use: Not Currently    Current Outpatient Medications  Medication Sig Dispense Refill   acetaminophen (TYLENOL) 500 MG tablet Take 1,000 mg by mouth every 6 (six) hours as needed for mild pain.     amLODipine (NORVASC) 10 MG tablet Take 1 tablet (10 mg total) by mouth daily. 30 tablet 1   aspirin EC 81 MG tablet Take 81 mg by mouth daily. Swallow whole.     atorvastatin (LIPITOR) 40 MG tablet Take 1 tablet (40 mg total) by mouth daily at 6 PM. Schedule an appointment with cardiology for further refills, 2nd attempt 30 tablet 1   BRILINTA 90 MG TABS tablet TAKE 1 TABLET(90 MG) BY MOUTH TWICE DAILY 60 tablet 0   carvedilol (COREG) 25 MG tablet TAKE 1 TABLET(25 MG) BY MOUTH TWICE DAILY WITH A MEAL 60 tablet 0   gentamicin cream (GARAMYCIN) 0.1 % Apply 1 application. topically daily.     hydrALAZINE (APRESOLINE) 50 MG tablet Take 1 tablet (50 mg total) by mouth every 8 (eight) hours. 90 tablet 1   ondansetron (ZOFRAN-ODT) 4 MG disintegrating tablet Take 1 tablet (4 mg total) by mouth every 8 (eight) hours as needed for nausea or vomiting. 30 tablet 0   sevelamer carbonate (RENVELA) 800 MG tablet Take 2 tablets (1,600 mg total) by mouth 3 (three) times daily with meals. 180 tablet 3   traZODone (DESYREL) 50 MG tablet Take 0.5-1 tablets (25-50 mg total) by mouth at bedtime as needed for sleep. 30 tablet 2   No current facility-administered medications for this  visit.    Allergies  Allergen Reactions   2-Octylcyanoacrylate [Cyanoacrylate] Other (See Comments)    Blistering from Dermabond, see photo in the chart.     Review of Systems:  Constitutional: Denies fever, chills, diaphoresis, appetite change and fatigue.  HEENT: Denies photophobia, eye pain, redness, hearing loss, ear pain, congestion, sore throat, rhinorrhea, sneezing, mouth sores, neck pain, neck stiffness and tinnitus.   Respiratory: Denies SOB, DOE, cough, chest tightness,  and wheezing.   Cardiovascular: Denies chest pain, palpitations and leg swelling.  Genitourinary: Denies dysuria, urgency, frequency, hematuria, flank pain and difficulty urinating.  Musculoskeletal: Denies myalgias, back pain, joint swelling, arthralgias and gait problem.  Skin: No rash.  Neurological: Denies dizziness, seizures, syncope, weakness, light-headedness, numbness and headaches.  Hematological: Denies adenopathy. Easy bruising, personal or family bleeding history  Psychiatric/Behavioral: No anxiety or depression     Physical  Exam:    BP 138/78   Pulse 81   Ht 5\' 6"  (1.676 m)   Wt 145 lb 4 oz (65.9 kg)   SpO2 98%   BMI 23.44 kg/m  Wt Readings from Last 3 Encounters:  10/30/21 145 lb 4 oz (65.9 kg)  10/08/21 145 lb 4 oz (65.9 kg)  09/27/21 143 lb 8.3 oz (65.1 kg)   Constitutional:  Well-developed, in no acute distress. Psychiatric: Normal mood and affect. Behavior is normal. HEENT: Pupils normal.  Conjunctivae are normal. No scleral icterus. Neck supple.  Cardiovascular: Normal rate, regular rhythm. No edema Pulmonary/chest: Effort normal and breath sounds normal. No wheezing, rales or rhonchi. Abdominal: Soft, nondistended. Nontender. Bowel sounds active throughout. There are no masses palpable. No hepatomegaly.  Has peritoneal dialysis catheter without any infections. Rectal: Deferred Neurological: Alert and oriented to person place and time. Skin: Skin is warm and dry. No rashes  noted.  Data Reviewed: I have personally reviewed following labs and imaging studies  CBC:    Latest Ref Rng & Units 09/27/2021    1:02 AM 09/26/2021    8:36 AM 09/25/2021    4:18 PM  CBC  WBC 4.0 - 10.5 K/uL 9.3  9.5  10.9   Hemoglobin 12.0 - 15.0 g/dL 9.4  10.2  9.3   Hematocrit 36.0 - 46.0 % 31.3  33.0  30.3   Platelets 150 - 400 K/uL 357  420  375     CMP:    Latest Ref Rng & Units 10/10/2021    2:00 PM 09/27/2021    1:02 AM 09/26/2021    8:36 AM  CMP  Glucose 70 - 99 mg/dL 105  84  108   BUN 6 - 23 mg/dL 56  22  24   Creatinine 0.40 - 1.20 mg/dL 23.98  10.67  13.71   Sodium 135 - 145 mEq/L 141  136  134   Potassium 3.5 - 5.1 mEq/L 5.7  4.6  5.2   Chloride 96 - 112 mEq/L 93  94  94   CO2 19 - 32 mEq/L 29  29  28    Calcium 8.4 - 10.5 mg/dL 9.8  9.0  8.9        Carmell Austria, MD 10/30/2021, 8:43 AM  Cc: [name], NP

## 2021-11-12 ENCOUNTER — Telehealth: Payer: Self-pay

## 2021-11-12 NOTE — Telephone Encounter (Signed)
LVM for patient to call back.   Patient needs to cancel CT since she recently had one done on 7-19 per insurance auth team

## 2021-11-12 NOTE — Telephone Encounter (Signed)
LVM for patient

## 2021-11-13 ENCOUNTER — Ambulatory Visit (HOSPITAL_COMMUNITY): Payer: POS

## 2021-11-18 ENCOUNTER — Encounter (HOSPITAL_COMMUNITY): Admission: RE | Admit: 2021-11-18 | Payer: POS | Source: Ambulatory Visit

## 2021-11-22 ENCOUNTER — Other Ambulatory Visit: Payer: Self-pay | Admitting: Family

## 2021-11-24 ENCOUNTER — Other Ambulatory Visit: Payer: Self-pay | Admitting: Family

## 2022-01-08 ENCOUNTER — Ambulatory Visit: Payer: POS | Admitting: Family

## 2022-03-13 ENCOUNTER — Other Ambulatory Visit: Payer: Self-pay | Admitting: Family

## 2022-03-18 ENCOUNTER — Telehealth: Payer: Self-pay

## 2022-03-18 NOTE — Telephone Encounter (Signed)
Pt Is checking into [facility] now for kidney transplant - no fu appt needed with PCP

## 2022-08-03 ENCOUNTER — Telehealth: Payer: Self-pay

## 2022-08-03 NOTE — Transitions of Care (Post Inpatient/ED Visit) (Signed)
   08/03/2022  Name: Stacie Greene MRN: [id number] DOB: 02-14-1976  Today's TOC FU Call Status: Today's TOC FU Call Status:: Unsuccessul Call (1st Attempt) Unsuccessful Call (1st Attempt) Date: 08/03/22  Attempted to reach the patient regarding the most recent Inpatient/ED visit.  Follow Up Plan: Additional outreach attempts will be made to reach the patient to complete the Transitions of Care (Post Inpatient/ED visit) call.   Agnes Lawrence, CMA (AAMA)  [facility] [phone]

## 2022-08-19 NOTE — Transitions of Care (Post Inpatient/ED Visit) (Signed)
   08/19/2022  Name: Stacie Greene MRN: [id number] DOB: 1976-01-11  Today's TOC FU Call Status: Today's TOC FU Call Status:: Unsuccessul Call (1st Attempt) Unsuccessful Call (1st Attempt) Date: 08/03/22  Attempted to reach the patient regarding the most recent Inpatient/ED visit.  Follow Up Plan: No further outreach attempts will be made at this time. We have been unable to contact the patient. TOC not completed within 14 days.  Signature Agnes Lawrence, CMA (AAMA)  [facility] [phone]

## 2022-08-23 IMAGING — CR DG CHEST 2V
2 series · 2 of 2 positions shown · non-contrast
Comparison: None.

CLINICAL DATA: Intermittent chest pain.  History of hypertension.

EXAM:
CHEST - 2 VIEW

[chest pa]
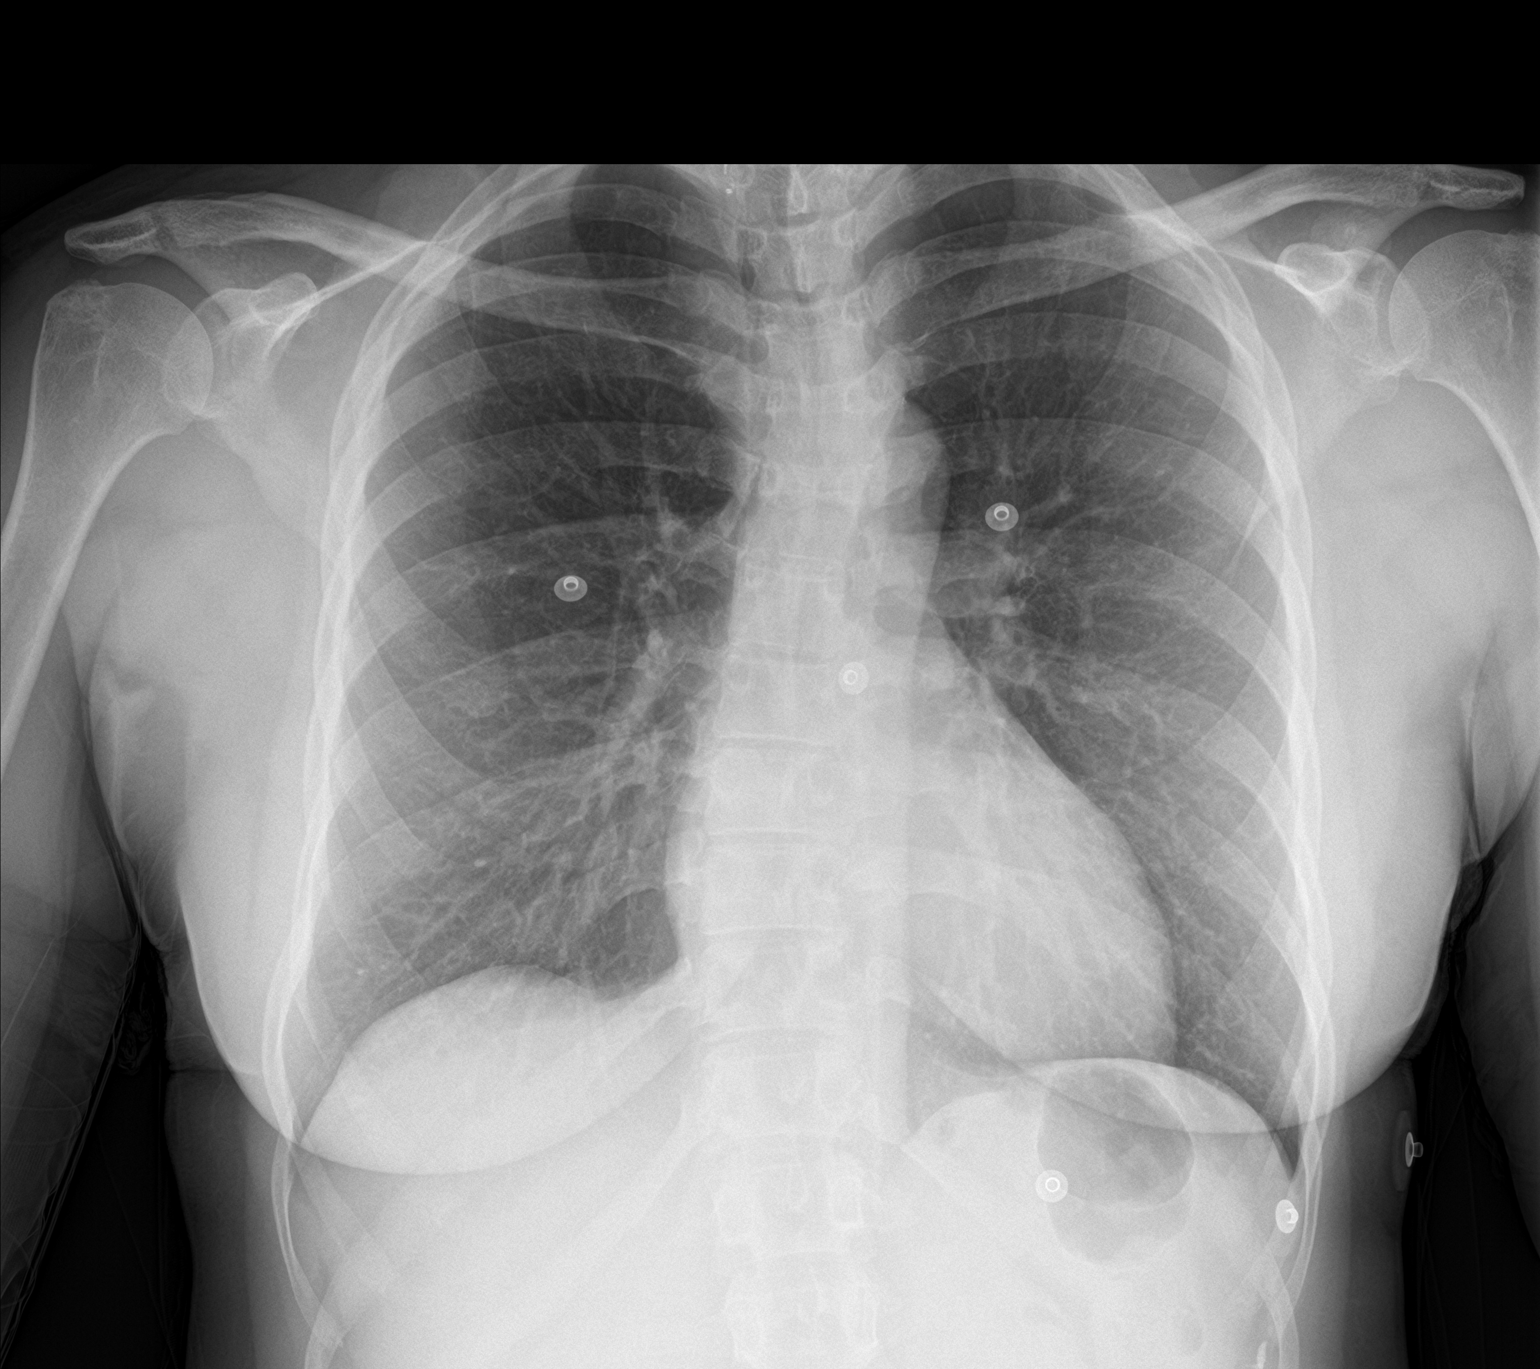

[chest lat]
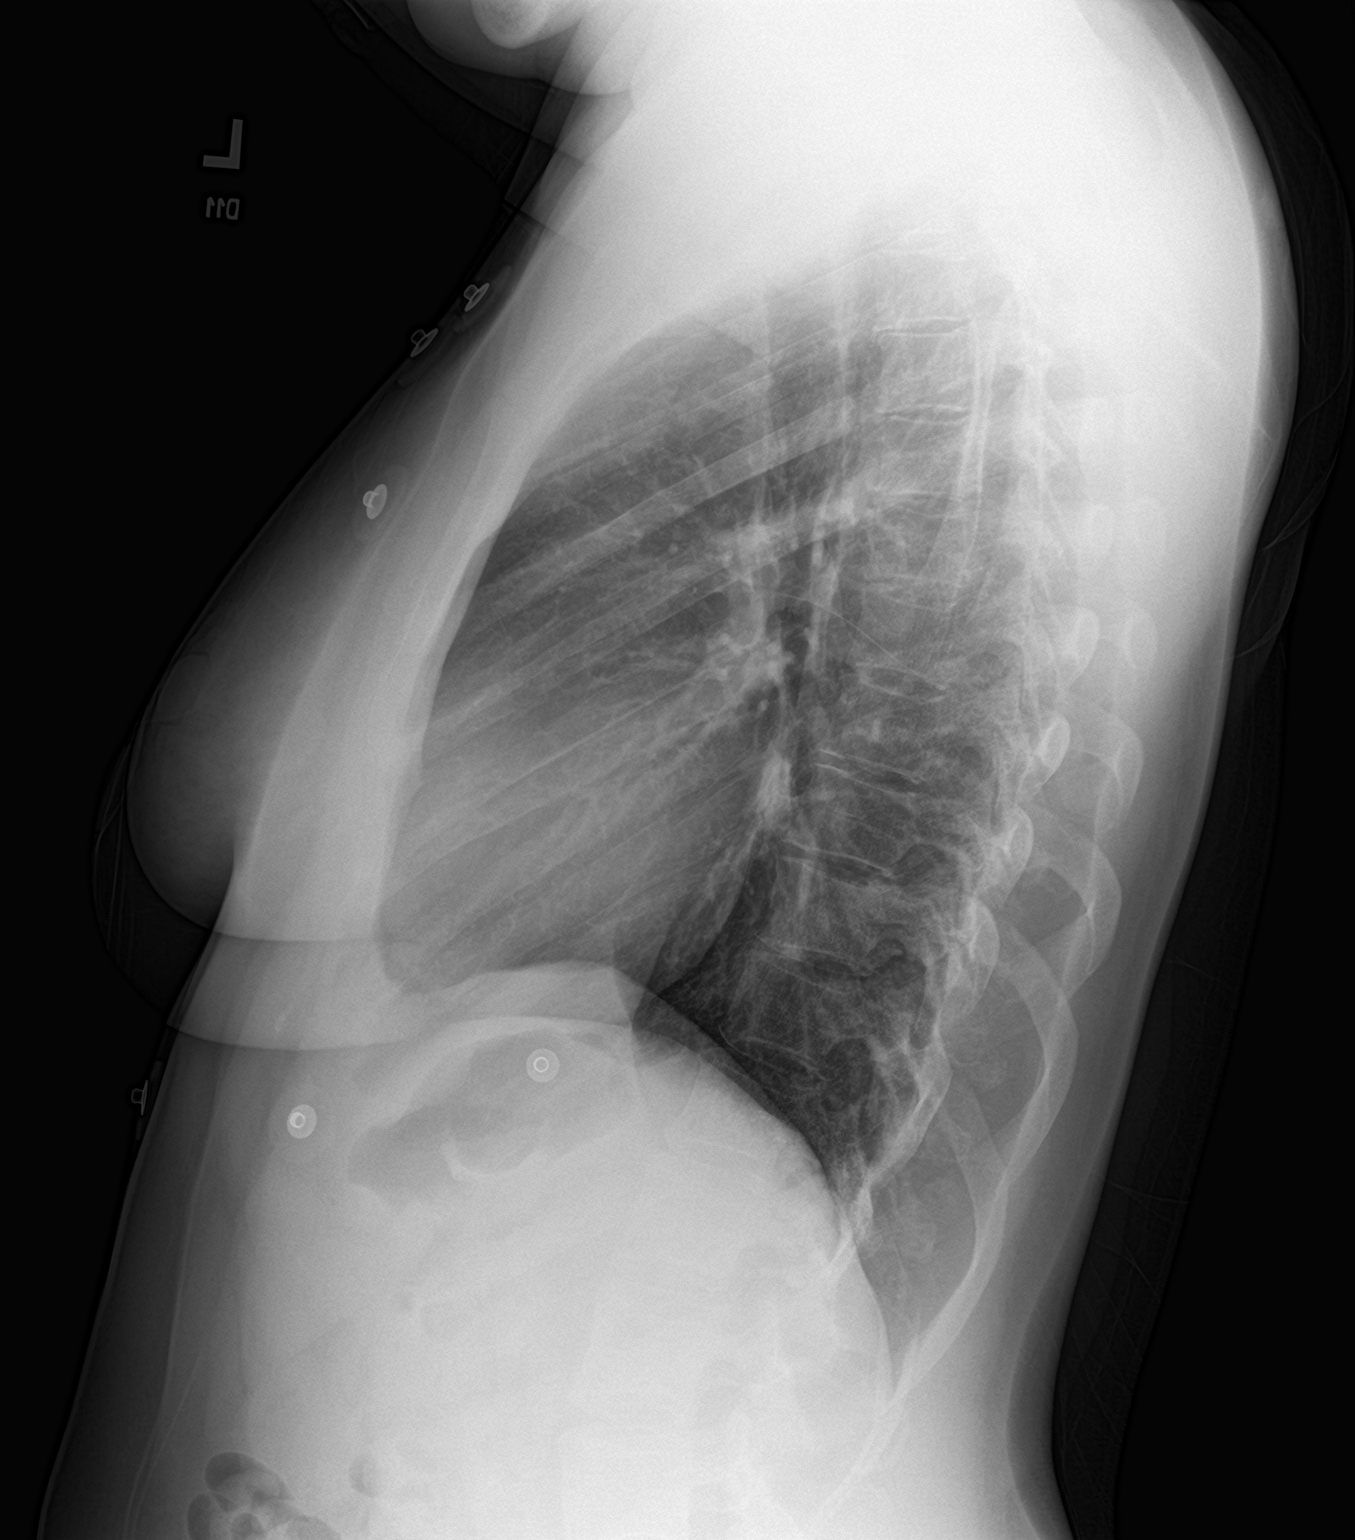

[2 of 2 positions shown; findings below may reference images not displayed]

FINDINGS: Heart and mediastinal shadows are normal. The lungs are clear. The
vascularity is normal. No effusions. Surgical clips in the low right
neck probably related to thyroid surgery. Mild thoracolumbar
scoliotic curvature.
IMPRESSION: No active cardiopulmonary disease. Mild thoracolumbar scoliotic
curvature.

## 2022-11-11 ENCOUNTER — Telehealth: Payer: Self-pay

## 2022-11-11 NOTE — Telephone Encounter (Signed)
Opened in error    Woodfin Ganja LPN [facility] Nurse Health Advisor Direct Dial [phone]

## 2023-12-27 ENCOUNTER — Telehealth: Payer: Self-pay | Admitting: *Deleted

## 2023-12-27 NOTE — Transitions of Care (Post Inpatient/ED Visit) (Signed)
   12/27/2023  Name: Stacie Greene MRN: [id number] DOB: 21-Sep-1975  Today's TOC FU Call Status: Today's TOC FU Call Status:: Unsuccessful Call (1st Attempt) Unsuccessful Call (1st Attempt) Date: 12/27/23  Attempted to reach the patient regarding the most recent Inpatient/ED visit.  Follow Up Plan: Additional outreach attempts will be made to reach the patient to complete the Transitions of Care (Post Inpatient/ED visit) call.   Mliss Creed [facility], BSN RN Care Manager/ Transition of Care [place]/ [facility] [phone]

## 2023-12-28 ENCOUNTER — Telehealth: Payer: Self-pay | Admitting: *Deleted

## 2023-12-28 NOTE — Transitions of Care (Post Inpatient/ED Visit) (Signed)
   12/28/2023  Name: Stacie Greene MRN: [id number] DOB: [birth year]  Today's TOC FU Call Status: Today's TOC FU Call Status:: Unsuccessful Call (2nd Attempt) Unsuccessful Call (2nd Attempt) Date: 12/28/23  Attempted to reach the patient regarding the most recent Inpatient/ED visit.  Follow Up Plan: Additional outreach attempts will be made to reach the patient to complete the Transitions of Care (Post Inpatient/ED visit) call.   Mliss Creed [facility], BSN RN Care Manager/ Transition of Care [place]/ [facility] [phone]

## 2023-12-29 ENCOUNTER — Telehealth: Payer: Self-pay | Admitting: *Deleted

## 2023-12-29 NOTE — Transitions of Care (Post Inpatient/ED Visit) (Signed)
   12/29/2023  Name: Stacie Greene MRN: [id number] DOB: Feb 12, 1976  Today's TOC FU Call Status: Today's TOC FU Call Status:: Unsuccessful Call (3rd Attempt) Unsuccessful Call (3rd Attempt) Date: 12/29/23  Attempted to reach the patient regarding the most recent Inpatient/ED visit.  Follow Up Plan: No further outreach attempts will be made at this time. We have been unable to contact the patient.  Mliss Creed [facility], BSN RN Care Manager/ Transition of Care [place]/ [facility] [phone]

## 2024-02-21 IMAGING — DX DG CHEST 1V PORT
1 series · 1 of 1 positions shown · non-contrast
Comparison: 10/16/2020 chest radiograph

CLINICAL DATA: Vomiting and shortness of breath. History of
end-stage renal disease.

EXAM:
PORTABLE CHEST 1 VIEW

[chest ap]
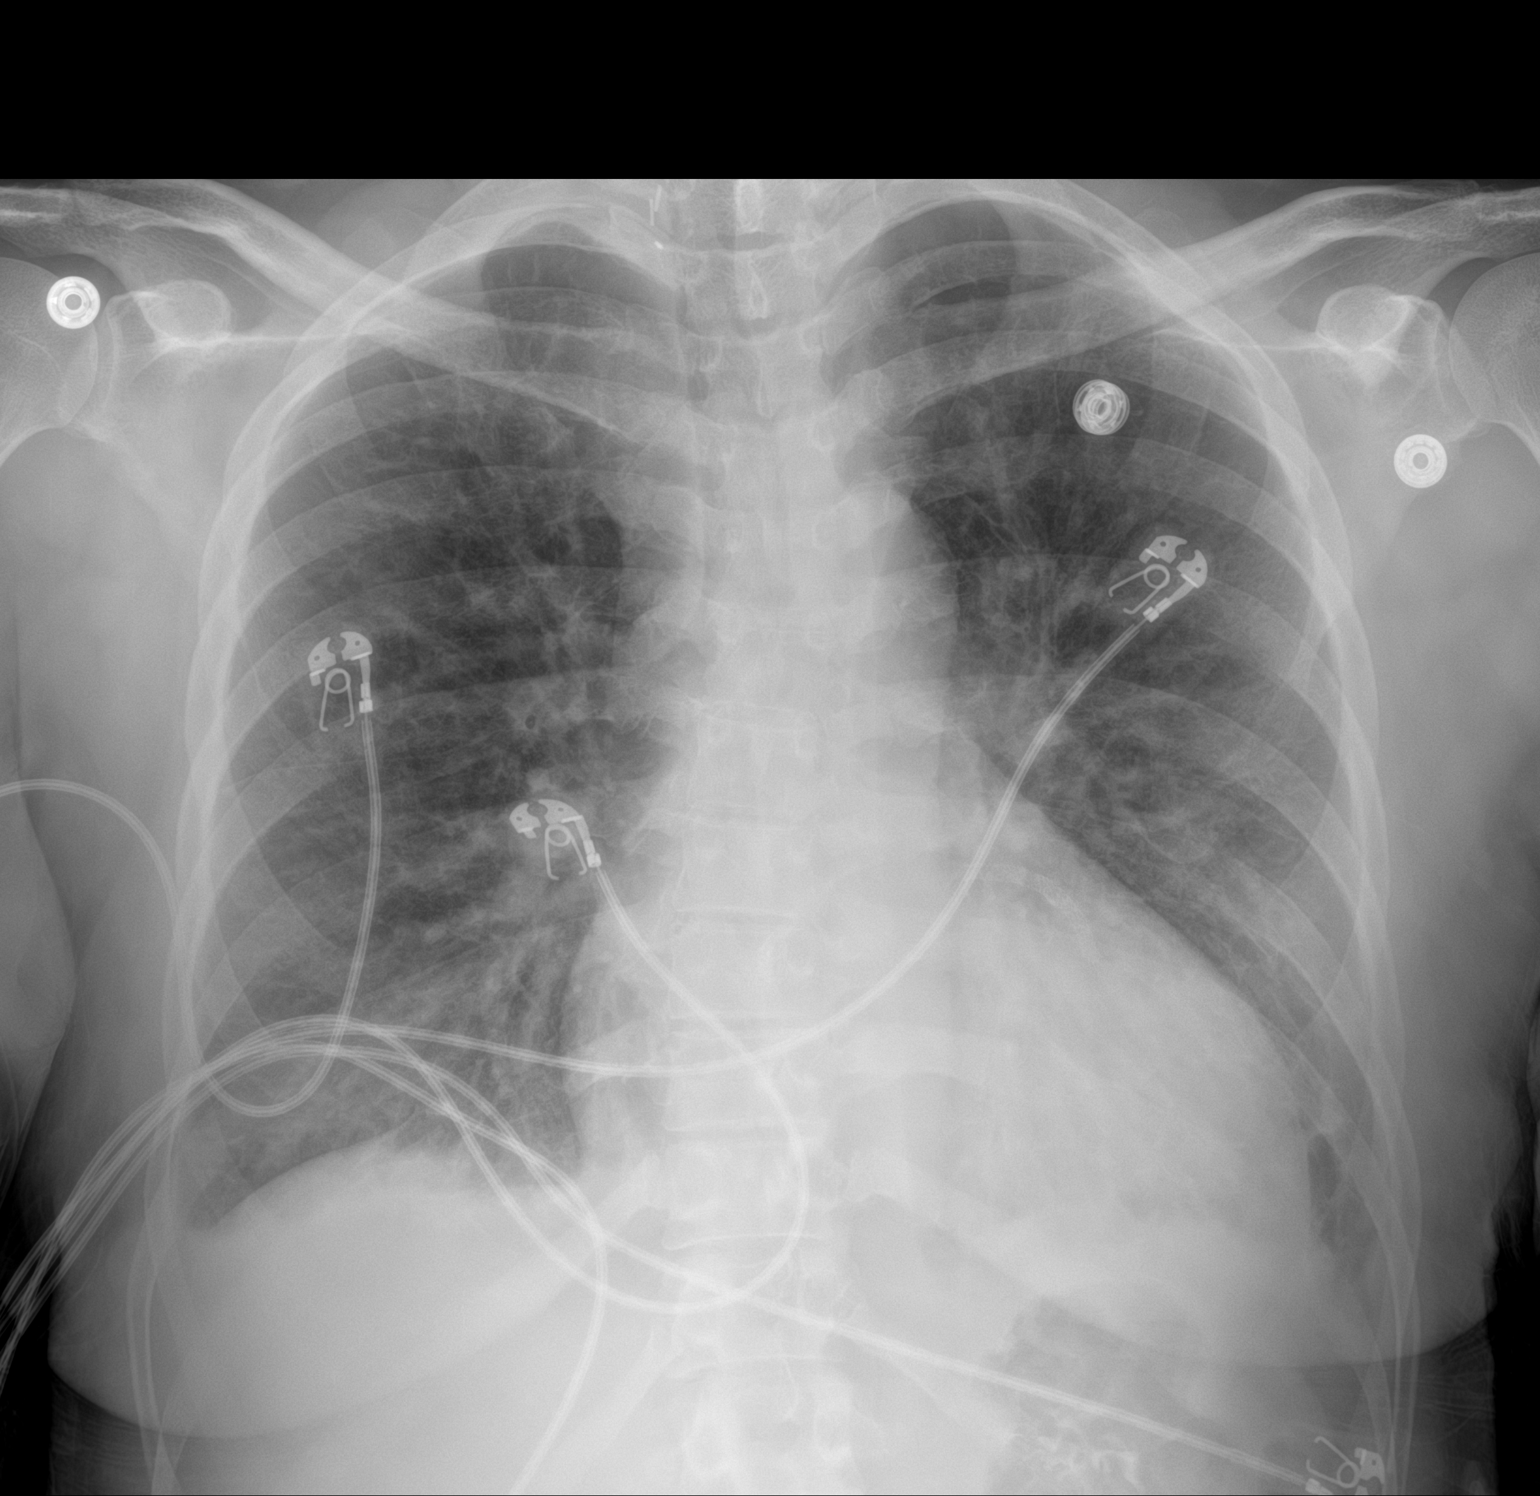

[1 of 1 positions shown; findings below may reference images not displayed]

FINDINGS: UPPER limits normal heart size and pulmonary vascular congestion
noted. Minimal interstitial opacities are present likely
representing very mild interstitial edema.

Trace bilateral pleural effusions are noted.

There is no evidence of pneumothorax.

Surgical clips within the RIGHT neck are again noted.

No acute bony abnormalities are present.
IMPRESSION: UPPER limits normal heart size with pulmonary vascular congestion
and probable very mild interstitial edema. Trace bilateral pleural
effusions.

## 2024-05-24 NOTE — H&P (Addendum)
 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 (*)    History of aortic dissection    Secondary hyperparathyroidism of renal origin (*)    *Hyperkalemia    Coronary artery disease involving native coronary artery of native heart without angina pectoris    ESRD (end stage renal disease) on dialysis (*)  ESRD on HD Hyperkalemia Secondary hyperparathyroidism of renal origin Hypertensive urgency Coronary artery disease- stable, asymptomatic, reportedly takes aspirin  81mg  daily Chronic diastolic CHF- pulmonary edema on CXR but asymptomatic, O2 saturations stable on room air 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 To accommodate HD and potassium reversal, she will be admitted to cardiac telemetry unit - Spoke with nephrology. Plan for HD today and tomorrow. Repeat potassium level after HD to ensure appropriate resolution - Given leukocytosis and chronic decubitus ulcer, will consult wound care team for further evaluation. Repeat CBC in am - Resume home medications including aspirin  81mg  daily, atorvastatin  40mg  qhs, amlodipine  10mg  daily, hydralazine  100mg  TID, carvedilol  25mg  BID (hold for HR <60), Keppra 250mg  BID  DVT prophylaxis: Heparin  5,000 units subq every 8 hours  Fluid and electrolyte  disorder: hyperkalemia present on admission Plan: see plan above   Abnormal blood count: anemia present on admission Plan: CBC monitoring   Nutrition/wt abnormality: N/A; not present on admission Body mass index is 21.02 kg/m.  Plan: monitor; No diet orders on file   Coagulation defect: N/A; not present on admission Plan: N/A   Liver (test) abnormality: N/A; not present on admission Plan: N/A   Debility: paraplegia present on admission Plan: N/A    History  Stacie Greene is a 49 y.o. female with PMH as noted above who presents to [facility] from home for HD. Patient reportedly underwent HD on 1/31. She received a call to come back into the dialysis center due to hyperkalemia on labwork. Patient reportedly resistant to coming into dialysis center. Patient was then found with decreased responsiveness. Her mother provides support at home and brought patient to ED. Her vitals are notable for BP elevated to 205/101, HR 96bpm, O2 saturation 95% on room air. Labs show potassium 5.6, WBC 14.7, hemoglobin 8.6 CXR with signs of pulmonary edema. Patient was AAOx3 at the time of my evaluation and denied any symptoms whatsoever. She denies any fevers, chills, cough, congestion, chest pain or pressure, bleeding, bruising, issues with her sacral decubitus ulcer, pain anywhere. States she has regular bowel movements. She is agreeable to inpatient HD.   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   Thyroidectomy, partial     Wound debridement  10/10/2022   2/2 Infected sacral decubitus ulcer involving all layers except bone. Performed by Dr Sheilla at [facility].    Allergies[1] Prior to Admission medications  Medication Sig Start Date End Date Taking? Authorizing Provider  amLODIPine  besylate (NORVASC ) 10 mg tablet Take one tablet (10 mg dose) by mouth daily for 30 days. 12/23/23  Yes [name], NP  atorvastatin  (LIPITOR) 40 mg tablet Take one tablet (40 mg dose) by mouth at bedtime. Patient not taking: Reported on 05/24/2024 12/23/23   [name], NP  escitalopram oxalate (LEXAPRO) 10 mg tablet Take one tablet (10 mg dose) by mouth daily. Patient not taking: Reported on 05/24/2024 12/23/23   [name], NP  hydrALAZINE  HCl (APRESOLINE ) 100 mg tablet Take one tablet (100 mg dose) by mouth 3 (three) times a day. 12/23/23  Yes [name], NP  levETIRAcetam (KEPPRA) 250 mg tablet Take one tablet (250 mg dose) by mouth 2 (two) times daily. 12/23/23  Yes [name], NP  lidocaine -prilocaine  (EMLA ) cream Apply one Application topically as needed. Patient taking differently: Apply one Application topically as needed. Uses at Dialysis: Tuesday, Thursdays, Saturdays. 01/11/23  Yes [name], MD  Misc. Devices MISC [employer] [id number] Please provide motorized wheelchair that allows position changes for back and lower back and elevation of feet. Recommend cushion (roho) to prevent pressure ulcers. Please fit patient and provide wheelchair. Fax any additional information needed. 04/10/24   [name], DO  naloxegol oxalate (MOVANTIK) 25 mg tablet Take one tablet (25 mg dose) by mouth daily. 02/28/24  Yes [name], DO  NEPHRO-VITE (RENA-VITE) 0.8 mg TABS tablet  Take one tablet by mouth daily for 30 days. 12/04/23  Yes [name], MD  ondansetron  (ZOFRAN ) 4 mg tablet Take one tablet (4 mg dose) by mouth 2 (two) times a day as needed for Nausea.   Yes Historical Provider, MD  ondansetron  (ZOFRAN -ODT) 4 mg disintegrating tablet Take one tablet (4 mg dose) by mouth every 12 (twelve) hours as needed for Nausea. Max daily dose 8mg  03/22/23   [name], MD  oxyCODONE  HCl (ROXICODONE ) 10 mg immediate release tablet Take one tablet (10 mg dose) by mouth every 6 (six) hours as needed for Pain.   Yes Historical Provider, MD  pantoprazole  sodium (PROTONIX ) 20 mg tablet Take one tablet (20 mg dose) by mouth daily. 12/23/23  Yes [name], NP  pregabalin (LYRICA) 75 mg capsule Take one capsule (75 mg dose) by mouth at bedtime. Max Daily Amount: 75 mg 12/23/23  Yes [name], NP  sevelamer  (RENVELA ) 800 MG tablet Take three tablets (2,400 mg dose) by mouth 3 (three) times daily with meals.   Yes Historical Provider, MD  sevelamer  (RENVELA ) 800 MG tablet Take two tablets (1,600 mg dose) by mouth with snacks for Other.   Yes Historical Provider, MD   Social History[2] Family History[3] Review of Systems  Constitutional:  Negative for appetite change, diaphoresis, fatigue  and fever.  HENT:  Negative for congestion and rhinorrhea.   Respiratory:  Negative for cough, shortness of breath and wheezing.   Cardiovascular:  Negative for chest pain, palpitations and leg swelling.  Gastrointestinal:  Negative for abdominal distention, abdominal pain, constipation, nausea and vomiting.  Musculoskeletal:  Negative for arthralgias and myalgias.  Skin:  Negative for pallor and rash.  Neurological:  Negative for seizures and light-headedness.  Psychiatric/Behavioral:  Negative for confusion. The patient is not nervous/anxious.       Physical Examination  Heart Rate:  [96-105] 96 Resp:  [11-19] 11 BP: (193)/(93) 193/93 SpO2:  [95 %-97 %] 95 % Pain Score:    6   No data recorded  Physical Exam Constitutional:      General: She is not in acute distress.    Appearance: She is not ill-appearing or toxic-appearing.     Comments: Resting comfortably  Cardiovascular:     Rate and Rhythm: Normal rate and regular rhythm.     Heart sounds:     No friction rub. No gallop.  Musculoskeletal:     Right lower leg: Edema present.     Left lower leg: Edema present.     Comments: Trace bipedal edema  Pulmonary:     Breath sounds: No wheezing, rhonchi or rales.     Comments: Saturating 95% on room air. No use of accessory muscles for respiration Abdominal:     General: Bowel sounds are normal. There is no distension.     Palpations: Abdomen is soft.     Tenderness: There is no abdominal tenderness. There is no guarding or rebound.  Skin:    Coloration: Skin is not jaundiced or pale.     Findings: No erythema.  Neurological:     Mental Status: Mental status is at baseline.     Comments: Chronic lower extremity weakness  Psychiatric:        Mood and Affect: Mood normal.        Behavior: Behavior normal.   Results  Labs:  Recent Results (from the past 24 hours)  ECG 12 lead   Collection Time: 05/24/24 10:54 AM  Result Value Ref Range   Acquisition Device [id number]    Ventricular Rate 99 BPM   Atrial Rate 99 BPM   P-R Interval 158 ms   QRS Duration 82 ms   Q-T Interval 394 ms   QTC Calculation(Bazett) 505 ms   Calculated P Axis 65 degrees   Calculated R Axis 103 degrees   Calculated T Axis 54 degrees   ECG Diagnosis      Normal sinus rhythm Right atrial enlargement Rightward axis Anterior infarct (cited on or before 18-Dec-2023) Prolonged QT   When compared with ECG of 03-Feb-2024 11:48, QRS axis shifted right ST now depressed in Inferior leads   CBC And Differential   Collection Time: 05/24/24 11:03 AM  Result Value Ref Range   WBC 14.7 (H) 4.0 - 10.5 10e3cells/uL   RBC 3.21 (L) 3.93 - 5.22 10e6cells/uL   HGB 8.6 (L) 11.2 - 15.7  gm/dL   HCT 72.3 (L) 65.8 - 55.0 %   MCV 86.0 79.4 - 94.8 fL   MCH 26.8 25.6 - 32.2 pg   MCHC 31.2 (L) 32.2 - 35.5 gm/dL   Plt Ct 737 849 - 599 10e3cells/uL   RDW SD 49.4 (H) 35.1 - 46.3 fL   MPV 9.9 9.4 - 12.4 fL   NRBC% 0.0 0.0 - 0.2 /100WBC   Absolute NRBC Count  0.00 0.00 - 0.01 10e3cells/uL   NEUTROPHIL % 86.2 %   LYMPHOCYTE % 7.2 %   MONOCYTE % 4.0 %   Eosinophil % 1.8 %   BASOPHIL % 0.3 %   IG% 0.5 %   Absolute Neutrophil Count 12.63 (H) 1.50 - 7.50 10e3cells/uL   Absolute Lymphocyte Count 1.06 1.00 - 4.50 10e3cells/uL   Absolute Monocyte Count 0.59 0.10 - 0.80 10e3cells/uL   Absolute Eosinophil Count 0.26 0.00 - 0.50 10e3cells/uL   Absolute Basophil Count 0.04 0.00 - 0.20 10e3cells/uL   Absolute Immature Granulocyte Count 0.08 (H) 0.00 - 0.03 10e3cells/uL  Comprehensive Metabolic Panel   Collection Time: 05/24/24 11:05 AM  Result Value Ref Range   Na 141 136 - 146 mmol/L   Potassium 5.6 (H) 3.7 - 5.4 mmol/L   Cl 96 (L) 97 - 108 mmol/L   CO2 25 20 - 32 mmol/L   AGAP 20 (H) 7 - 16 mmol/L   Glucose 65 65 - 99 mg/dL   BUN 68 (H) 6 - 24 mg/dL   Creatinine 82.70 (H) 0.57 - 1.00 mg/dL   Ca 9.7 8.7 - 89.7 mg/dL   ALK PHOS 877 25 - 849 IU/L   T Bili 0.4 0.0 - 1.2 mg/dL   Total Protein 9.0 (H) 6.0 - 8.5 gm/dL   Alb 3.7 3.5 - 5.5 gm/dL   GLOBULIN 5.3 (H) 1.5 - 4.5 gm/dL   ALBUMIN/GLOBULIN RATIO 0.7 (L) 1.1 - 2.5   BUN/CREAT RATIO 4 (L) 11 - 26   ALT 6 0 - 40 IU/L   AST 13 0 - 40 IU/L   eGFR 2 (L) >=60 mL/min/1.30m2  Magnesium   Collection Time: 05/24/24 11:05 AM  Result Value Ref Range   Mg 3.0 (H) 1.6 - 2.6 mg/dL  CK   Collection Time: 05/24/24 11:05 AM  Result Value Ref Range   CK 210 (H) 24 - 173 IU/L   Imaging: XR Chest Ap Portable Result Date: 05/24/2024 SINGLE CHEST RADIOGRAPH: PROVIDED CLINICAL INDICATION: Other: missed dialysis ADDITIONAL CLINICAL INDICATION: None available COMPARISON: CXR 02/03/2024. FINDINGS: Mild cardiomegaly. Stenting is present along the  descending aorta. Indistinct margins along the left costophrenic angle. Right costophrenic angle is clear. Prominence of interstitial markings without overt Kerley B-lines. The osseous structures are age-appropriate without acute abnormality.   IMPRESSION: Pulmonary venous congestion without overt pulmonary edema. Suspected trace left pleural effusion. Mild cardiomegaly. Electronically Signed by: Dale Stokesdale, MD on 05/24/2024 11:15 AM  ECG: No results found.  Coding  Electronically signed: Albertha Blanch, MD 05/24/2024 / 12:53 PM       [1] Allergies Allergen Reactions   Cefepime Seizures    11/2022 hospitalization for encephalopathy and status epilepticus.  Questionable cefepime neurotoxicity   Cephalosporins Unknown   Enbucrilate Other    Blisters   Epoetin Alfa Unknown    Patient had CVA 08/2022 - this is why patient was not receiving ESA per Jenni Stank. No allergy   Lacosamide Other   Mecrylate Rash    Blistering from Dermabond, see photo in the chart.   [2] Social History Socioeconomic History   Marital status: Single   Number of children: 2   Years of education: High school grad  Tobacco Use   Smoking status: Former    Current packs/day: 0.00    Average packs/day: 0.3 packs/day for 10.0 years (2.5 ttl pk-yrs)    Types: Cigarettes    Start date: 2011    Quit date: 2021    Years since quitting: 5.0  Passive exposure: Past   Smokeless tobacco: Never  Vaping Use   Vaping status: Never Used  Substance and Sexual Activity   Alcohol use: Not Currently   Drug use: No   Sexual activity: Not Currently  Social History Narrative   Lives with mom and sister. Prior worked in forensic scientist, college education.   [3] Family History Problem Relation Name Age of Onset   Diabetes Mother     Heart failure Father

## 2024-05-25 NOTE — Discharge Summary (Signed)
 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 (*)   Anemia due to chronic kidney disease, on chronic dialysis (*)   Coronary artery disease involving native coronary artery of native heart without angina pectoris   ESRD (end stage renal disease) on dialysis (*)   Secondary hyperparathyroidism of renal origin (*)   Pressure injury of right ischium, stage 4 (*)   Task list for follow-up: - Encourage compliance with HD sessions   Follow-Up Appointments Suggested: [facility] [place], [place] [address] [facility] [place]  [id number] [phone]    Wilder MARLA Mattock, DO [address] [place] [address] [phone]     Follow-Up Appointments Already Scheduled: No future appointments.  Discharge Medications:   Current Discharge Medication List     START taking these medications      Details  aspirin  81 mg chewable tablet Start taking on: May 26, 2024  Chew one tablet (81 mg dose) by mouth daily.   atorvastatin  40 mg tablet Commonly known as: LIPITOR  Take one tablet (40 mg dose) by mouth at bedtime. Indication: High Amount of Fats in the Blood Quantity: 30 tablet   carvedilol  25 mg tablet Commonly known as: COREG   Take one tablet (25 mg dose) by mouth 2 (two) times daily. Quantity: 60 tablet   escitalopram oxalate 10 mg tablet Commonly known as: LEXAPRO  Take one tablet (10 mg dose) by mouth daily. Quantity: 30 tablet       CONTINUE these medications which have NOT CHANGED      Details  amLODIPine   besylate 10 mg tablet Commonly known as: NORVASC   Take one tablet (10 mg dose) by mouth daily for 30 days. Indication: High Blood Pressure Quantity: 30 tablet   hydrALAZINE  HCl 100 mg tablet Commonly known as: APRESOLINE   Take one tablet (100 mg dose) by mouth 3 (three) times a day. Indication: High Blood Pressure Quantity: 120 tablet   levETIRAcetam 250 mg tablet Commonly known as: KEPPRA  Take one tablet (250 mg dose) by mouth 2 (two) times daily. Indication: Seizure Quantity: 120 tablet   lidocaine -prilocaine  cream Commonly known as: EMLA   Apply one Application topically as needed. Quantity: 30 g   Misc. Devices Misc  [employer] [id number] Please provide motorized wheelchair that allows position changes for back and lower back and elevation of feet. Recommend cushion (roho) to prevent pressure ulcers. Please fit patient and provide wheelchair. Fax any additional information needed. Quantity: 1 each   naloxegol oxalate 25 mg tablet Commonly known as: MOVANTIK  Take one tablet (25 mg dose) by mouth daily. Quantity: 30 tablet   NEPHRO-VITE 0.8 mg Tabs tablet  Take one tablet by mouth daily for 30 days. Quantity: 30 tablet   ondansetron  4 mg tablet Commonly known as: ZOFRAN   Take one tablet (4 mg dose) by mouth 2 (two) times a day as needed for Nausea.   oxyCODONE  HCl 10 mg immediate release tablet Commonly known as: ROXICODONE   Take one tablet (  10 mg dose) by mouth every 6 (six) hours as needed for Pain.   pantoprazole  sodium 20 mg tablet Commonly known as: PROTONIX   Take one tablet (20 mg dose) by mouth daily. Quantity: 30 tablet   pregabalin 75 mg capsule Commonly known as: LYRICA  Take one capsule (75 mg dose) by mouth at bedtime. Max Daily Amount: 75 mg Quantity: 60 capsule   * sevelamer  800 MG tablet Commonly known as: RENVELA   Take three tablets (2,400 mg dose) by mouth 3 (three) times daily with meals.   * sevelamer  800 MG tablet Commonly known as:  RENVELA   Take two tablets (1,600 mg dose) by mouth with snacks for Other.      * * This list has 2 medication(s) that are the same as other medications prescribed for you. Read the directions carefully, and ask your doctor or other care provider to review them with you.         * You might also be taking other medications not listed above. If you have questions about any of your other medications, talk to the person who prescribed them or your Primary Care Provider.          STOP taking these medications    ondansetron  4 mg disintegrating tablet Commonly known as: ZOFRAN -ODT        Allergies: Allergies[1]  Consultations this Admission: IP CONSULT TO NEPHROLOGY INPATIENT CONSULT TO WOUND OSTOMY IP CONSULT TO NUTRITION SERVICES IP CONSULT TO SPIRITUAL CARE  Procedures/Imaging:     XR Chest Ap Portable  Final Result  IMPRESSION:    Pulmonary venous congestion without overt pulmonary edema. Suspected trace left pleural effusion. Mild cardiomegaly.      Electronically Signed by: Dale Mutual, MD on 05/24/2024 11:15 AM      Pertinent Labs:  Cardiac Labs: Recent Labs    Units 05/24/24 1105  CK IU/L 210*   CBC: Recent Labs    Units 05/25/24 1219 05/25/24 0513 05/24/24 1103  WBC 10e3cells/uL  --  10.0 14.7*  HGB gm/dL 7.7* 7.7* 8.6*  PLT [id number]/lO  --  241 262   BMP: Recent Labs    Units 05/25/24 0513 05/24/24 1105  NA mmol/L 139 141  K mmol/L 4.3 5.6*  CL mmol/L 99 96*  CO2 mmol/L 26 25  BUN mg/dL 34* 68*  CREATININE mg/dL 89.54* 82.70*  MAGNESIUM mg/dL  --  3.0*   Lipid Panel: No results for input(s): CHOL, TRIG, HDL, LDL in the last 168 hours. Liver Enzymes: Recent Labs    Units 05/24/24 1105  AST IU/L 13  ALT IU/L 6  ALKPHOS IU/L 122  BILITOT mg/dL 0.4   Endocrine Panels: Recent Labs    Units 05/25/24 1347 05/25/24 0812 05/25/24 0639 05/25/24 0513 05/25/24 0039 05/25/24 0018 05/24/24 1932 05/24/24 1105  HGBA1C %   --   --   --  5.0  --   --   --   --   GLUCOSE mg/dL 892* 80 845* 59* 830* 69* 65* 65    Hospital Course   Physicians involved in care during this hospitalization Attending Provider: Mabel Bathe, MD Attending Provider: Albertha Blanch, MD Admitting Provider: Albertha Blanch, MD Consulting Physician: Ozell Rosalynn Kipper, DO Consulting Physician: Albertha Blanch, MD  Indication for admission, HPI: Hyperkalemia Patient is a 49 y/o female with PMH as noted above who presented to [facility] from home for HD. Patient reportedly underwent HD on 1/31. She received a call to come back into the dialysis center  due to hyperkalemia on labwork. Patient reportedly resistant to coming into dialysis center. Patient was then found with decreased responsiveness. Her mother provides support at home and brought patient to ED. Her vitals are notable for BP elevated to 205/101, HR 96bpm, O2 saturation 95% on room air. Labs show potassium 5.6, WBC 14.7, hemoglobin 8.6 CXR with signs of pulmonary edema. Patient was AAOx3 at the time of my evaluation and denied any symptoms whatsoever. She denies any fevers, chills, cough, congestion, chest pain or pressure, bleeding, bruising, issues with her sacral decubitus ulcer, pain anywhere. She was admitted under hospitalist service in order to receive hemodialysis.  Hospital Course:  Patient underwent hemodialysis on 2/4 along with 2/5 with fluid removal via ultrafiltration.  Hyperkalemia resolved with HD.  Potassium was 4.3 as of 2/5 AM.  Patient did have an isolated low-grade fever temperature 100.4 F on 2/5 AM but asymptomatic from this standpoint. She had no chills, fatigue, shortness of breath, cough, congestion, new rash, new joint pain or swelling, abdominal pain or discomfort, diarrhea, etc. She then remained afebrile throughout the day. WBC normal at 10.0. Chronic sacral decubitus ulcer present on arrival and evaluated by wound care team- appears to be improving when  compared to images/report from January. Patient was discharged home in asymptomatic and hemodynamically stable condition. She was counseled on compliance with HD treatments.  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 Improving compared to January evaluation Leukocytosis- chronic, fluctuating. Resolved without intervention. No signs of acute infection History of aortic dissection- s/p TEVAR 07/2012 Chronic opioid use and slow transit constipation- stable on oxycodone  and Movantik  BP (!) 139/58 (BP Location: Right Upper Arm, Patient Position: Lying)   Pulse 77   Temp 98.3 F (36.8 C) (Oral)   Resp 17   Ht 1.575 m (5' 2)   Wt 56.8 kg (125 lb 3.5 oz)   SpO2 100%   BMI 22.90 kg/m   Physical Exam Constitutional:      General: She is not in acute distress.    Appearance: She is not ill-appearing or toxic-appearing.     Comments: Resting comfortably  Cardiovascular:     Rate and Rhythm: Normal rate and regular rhythm.     Heart sounds:     No friction rub. No gallop.  Musculoskeletal:     Right lower leg: Edema present.     Left lower leg: Edema present.     Comments: Trace bipedal edema now resolved Pulmonary:     Breath sounds: No wheezing, rhonchi or rales.     Comments: Saturating 100% on room air. No use of accessory muscles for  respiration Abdominal:     General: Bowel sounds are normal. There is no distension.     Palpations: Abdomen is soft.     Tenderness: There is no abdominal tenderness. There is no guarding or rebound.  Skin:    Coloration: Skin is not jaundiced or pale.     Findings: No erythema.  Neurological:  Mental Status: Mental status is at baseline.     Comments: Chronic lower extremity weakness  Psychiatric:        Mood and Affect: Mood normal.        Behavior: Behavior normal.   Post Hospital Care   Activity:   Weight Bearing Status:          Oxygen Orders for Discharge: O2 Device: None (Room air) SpO2: 100 %  Diet: Diet and Nourishment Orders (From admission, onward)     Start       05/25/24 0904  Renal diet  Diet effective now                   Wound Care Recommendations: See below: Wound Orders (From admission, onward)     Start       05/25/24 0900  WOUND CARE     Comments: Please re-consult wound care team if any changes in appearance of skin or wounds compared to last wound care team assessment on 05-24-24.  Order Comments: Please re-consult wound care team if any changes in appearance of skin or wounds compared to last wound care team assessment on 05-24-24.        05/25/24 0900  WOUND CARE     Comments: Petrolatum based ointment tid and prn to heels.  Order Comments: Petrolatum based ointment tid and prn to heels.        05/25/24 0900  WOUND CARE     Comments: Petrolatum based ointment to buttocks, sacrum, intergluteal cleft.  Order Comments: Petrolatum based ointment to buttocks, sacrum, intergluteal cleft.        05/25/24 0837  WOUND CARE     Comments: Cleanse wound to right ischium with saline, apply Aquacel AG, and cover with dry dressing. Change every other day and prn soiled.  Order Comments: Cleanse wound to right ischium with saline, apply Aquacel AG, and cover with dry dressing. Change every other day and prn soiled.                  Lines/Drains/Airways: Patient Lines/Drains/Airways Status     Active LDAs     None            Therapy Recommendations:  PT:                OT:          SLP:              Home Health Orders: DME Orders (From admission, onward)    None      Home Health Agency             Ambulatory Referral to Home Health       Question Answer Comment  Home Health Disciplines: HHA   Home Health Disciplines: Skilled Nursing   Home health services are needed due to: medication non-compliance or understanding   Home health services are needed due to: knowledge deficit for management of disease process   Home health services are needed due to: open, draining, complex and/or non-healing wound(s)   This certifies this patient requires services in the home due to: is bedbound   This certifies this patient requires services in the home due to: requires physical assistance to leave home                 I spent 32 minutes performing discharge services.   Electronically signed: Albertha Blanch, MD 05/25/2024 / 6:36 PM      [1] Allergies  Allergen Reactions   Cefepime Seizures    11/2022 hospitalization for encephalopathy and status epilepticus.  Questionable cefepime neurotoxicity   Cephalosporins Unknown   Enbucrilate Other    Blisters   Epoetin Alfa Unknown    Patient had CVA 08/2022 - this is why patient was not receiving ESA per Jenni Stank. No allergy   Lacosamide Other   Mecrylate Rash    Blistering from Dermabond, see photo in the chart.   *Some images could not be shown.

## 2024-05-26 ENCOUNTER — Telehealth: Payer: Self-pay

## 2024-05-26 NOTE — Transitions of Care (Post Inpatient/ED Visit) (Signed)
" ° °  05/26/2024  Name: Estha Few MRN: [id number] DOB: [birth year]  Today's TOC FU Call Status: Today's TOC FU Call Status:: Unsuccessful Call (1st Attempt) Unsuccessful Call (1st Attempt) Date: 05/26/24  Attempted to reach the patient regarding the most recent Inpatient/ED visit.  No answer, voicemail message is full and unable to leave a message to phone number provided in demographics per DPR.    Follow Up Plan: Additional outreach attempts will be made to reach the patient to complete the Transitions of Care (Post Inpatient/ED visit) call.   Richerd Fish, RN, BSN, CCM [facility], [facility] RN Care Manager Direct Dial: [phone]        "
